# Patient Record
Sex: Female | Born: 1965 | Race: White | Hispanic: No | Smoking: Never smoker
Health system: Southern US, Community
[De-identification: ages and names within clinical notes are randomized; demographics above are authoritative.]

## PROBLEM LIST (undated history)

## (undated) DIAGNOSIS — F419 Anxiety disorder, unspecified: Secondary | ICD-10-CM

## (undated) DIAGNOSIS — S069X9A Unspecified intracranial injury with loss of consciousness of unspecified duration, initial encounter: Secondary | ICD-10-CM

## (undated) DIAGNOSIS — F32A Depression, unspecified: Secondary | ICD-10-CM

## (undated) DIAGNOSIS — M1990 Unspecified osteoarthritis, unspecified site: Secondary | ICD-10-CM

## (undated) DIAGNOSIS — F329 Major depressive disorder, single episode, unspecified: Secondary | ICD-10-CM

## (undated) HISTORY — DX: Anxiety disorder, unspecified: F41.9

## (undated) HISTORY — PX: TEMPOROMANDIBULAR JOINT SURGERY: SHX35

---

## 1998-01-18 ENCOUNTER — Emergency Department (HOSPITAL_COMMUNITY): Admission: EM | Admit: 1998-01-18 | Discharge: 1998-01-18 | Payer: Self-pay | Admitting: Emergency Medicine

## 1998-11-10 ENCOUNTER — Emergency Department (HOSPITAL_COMMUNITY): Admission: EM | Admit: 1998-11-10 | Discharge: 1998-11-10 | Payer: Self-pay | Admitting: Emergency Medicine

## 1998-11-15 ENCOUNTER — Emergency Department (HOSPITAL_COMMUNITY): Admission: EM | Admit: 1998-11-15 | Discharge: 1998-11-15 | Payer: Self-pay | Admitting: *Deleted

## 1998-11-15 ENCOUNTER — Encounter: Payer: Self-pay | Admitting: *Deleted

## 1999-06-19 HISTORY — PX: DILATION AND CURETTAGE OF UTERUS: SHX78

## 1999-08-07 ENCOUNTER — Encounter (INDEPENDENT_AMBULATORY_CARE_PROVIDER_SITE_OTHER): Payer: Self-pay | Admitting: Specialist

## 1999-08-07 ENCOUNTER — Observation Stay (HOSPITAL_COMMUNITY): Admission: AD | Admit: 1999-08-07 | Discharge: 1999-08-08 | Payer: Self-pay | Admitting: Obstetrics and Gynecology

## 1999-10-23 ENCOUNTER — Inpatient Hospital Stay (HOSPITAL_COMMUNITY): Admission: EM | Admit: 1999-10-23 | Discharge: 1999-10-23 | Payer: Self-pay | Admitting: Obstetrics & Gynecology

## 2000-03-26 ENCOUNTER — Inpatient Hospital Stay (HOSPITAL_COMMUNITY): Admission: EM | Admit: 2000-03-26 | Discharge: 2000-04-02 | Payer: Self-pay | Admitting: *Deleted

## 2000-04-12 ENCOUNTER — Emergency Department (HOSPITAL_COMMUNITY): Admission: EM | Admit: 2000-04-12 | Discharge: 2000-04-13 | Payer: Self-pay | Admitting: Emergency Medicine

## 2001-06-18 HISTORY — PX: TUBAL LIGATION: SHX77

## 2002-09-03 ENCOUNTER — Encounter: Payer: Self-pay | Admitting: Emergency Medicine

## 2002-09-03 ENCOUNTER — Emergency Department (HOSPITAL_COMMUNITY): Admission: EM | Admit: 2002-09-03 | Discharge: 2002-09-03 | Payer: Self-pay | Admitting: Emergency Medicine

## 2002-09-09 ENCOUNTER — Encounter: Admission: RE | Admit: 2002-09-09 | Discharge: 2002-09-09 | Payer: Self-pay | Admitting: Family Medicine

## 2002-09-09 ENCOUNTER — Encounter: Payer: Self-pay | Admitting: Family Medicine

## 2002-12-08 ENCOUNTER — Emergency Department (HOSPITAL_COMMUNITY): Admission: EM | Admit: 2002-12-08 | Discharge: 2002-12-08 | Payer: Self-pay | Admitting: Emergency Medicine

## 2005-01-24 ENCOUNTER — Emergency Department (HOSPITAL_COMMUNITY): Admission: EM | Admit: 2005-01-24 | Discharge: 2005-01-24 | Payer: Self-pay | Admitting: Emergency Medicine

## 2005-02-13 ENCOUNTER — Encounter: Admission: RE | Admit: 2005-02-13 | Discharge: 2005-02-13 | Payer: Self-pay | Admitting: Orthopaedic Surgery

## 2005-03-07 ENCOUNTER — Encounter: Admission: RE | Admit: 2005-03-07 | Discharge: 2005-03-07 | Payer: Self-pay | Admitting: Orthopaedic Surgery

## 2005-11-10 DIAGNOSIS — S069X9A Unspecified intracranial injury with loss of consciousness of unspecified duration, initial encounter: Secondary | ICD-10-CM

## 2005-11-10 DIAGNOSIS — S069XAA Unspecified intracranial injury with loss of consciousness status unknown, initial encounter: Secondary | ICD-10-CM

## 2005-11-10 HISTORY — DX: Unspecified intracranial injury with loss of consciousness of unspecified duration, initial encounter: S06.9X9A

## 2005-11-10 HISTORY — DX: Unspecified intracranial injury with loss of consciousness status unknown, initial encounter: S06.9XAA

## 2008-03-01 ENCOUNTER — Emergency Department (HOSPITAL_COMMUNITY): Admission: EM | Admit: 2008-03-01 | Discharge: 2008-03-02 | Payer: Self-pay | Admitting: Emergency Medicine

## 2008-04-15 ENCOUNTER — Emergency Department (HOSPITAL_COMMUNITY): Admission: EM | Admit: 2008-04-15 | Discharge: 2008-04-15 | Payer: Self-pay | Admitting: Emergency Medicine

## 2008-09-01 ENCOUNTER — Inpatient Hospital Stay (HOSPITAL_COMMUNITY): Admission: RE | Admit: 2008-09-01 | Discharge: 2008-09-06 | Payer: Self-pay | Admitting: *Deleted

## 2008-09-01 ENCOUNTER — Ambulatory Visit: Payer: Self-pay | Admitting: *Deleted

## 2008-09-12 ENCOUNTER — Emergency Department (HOSPITAL_COMMUNITY): Admission: EM | Admit: 2008-09-12 | Discharge: 2008-09-12 | Payer: Self-pay | Admitting: Emergency Medicine

## 2010-07-09 ENCOUNTER — Encounter: Payer: Self-pay | Admitting: Orthopaedic Surgery

## 2010-09-28 LAB — COMPREHENSIVE METABOLIC PANEL
AST: 17 U/L (ref 0–37)
BUN: 10 mg/dL (ref 6–23)
CO2: 29 mEq/L (ref 19–32)
Calcium: 9.3 mg/dL (ref 8.4–10.5)
Chloride: 105 mEq/L (ref 96–112)
Creatinine, Ser: 0.75 mg/dL (ref 0.4–1.2)
GFR calc Af Amer: >60 mL/min (ref >60)
GFR calc non Af Amer: >60 mL/min (ref >60)
Total Bilirubin: 0.6 mg/dL (ref 0.3–1.2)

## 2010-09-28 LAB — POCT I-STAT, CHEM 8
BUN: 10 mg/dL (ref 6–23)
Calcium, Ion: 1.14 mmol/L (ref 1.12–1.32)
Chloride: 107 mEq/L (ref 96–112)
Creatinine, Ser: 0.7 mg/dL (ref 0.4–1.2)
TCO2: 23 mmol/L (ref 0–100)

## 2010-09-28 LAB — URINALYSIS, ROUTINE W REFLEX MICROSCOPIC
Bilirubin Urine: NEGATIVE
Glucose, UA: NEGATIVE mg/dL
Ketones, ur: NEGATIVE mg/dL
Ketones, ur: NEGATIVE mg/dL
Leukocytes, UA: NEGATIVE
Protein, ur: NEGATIVE mg/dL
Specific Gravity, Urine: 1.026 (ref 1.005–1.030)
Urobilinogen, UA: 0.2 mg/dL (ref 0.0–1.0)
pH: 5.5 (ref 5.0–8.0)

## 2010-09-28 LAB — DIFFERENTIAL
Basophils Absolute: 0 10*3/uL (ref 0.0–0.1)
Basophils Relative: 1 % (ref 0–1)
Eosinophils Absolute: 0.4 10*3/uL (ref 0.0–0.7)
Neutro Abs: 2.5 10*3/uL (ref 1.7–7.7)
Neutrophils Relative %: 49 % (ref 43–77)

## 2010-09-28 LAB — BASIC METABOLIC PANEL
BUN: 9 mg/dL (ref 6–23)
CO2: 25 mEq/L (ref 19–32)
Calcium: 9 mg/dL (ref 8.4–10.5)
Creatinine, Ser: 0.7 mg/dL (ref 0.4–1.2)
GFR calc Af Amer: >60 mL/min (ref >60)
Glucose, Bld: 99 mg/dL (ref 70–99)

## 2010-09-28 LAB — CBC
HCT: 40 % (ref 36.0–46.0)
MCHC: 34 g/dL (ref 30.0–36.0)
MCHC: 34.1 g/dL (ref 30.0–36.0)
MCV: 86.4 fL (ref 78.0–100.0)
Platelets: 178 10*3/uL (ref 150–400)
Platelets: 245 10*3/uL (ref 150–400)
RBC: 3.9 MIL/uL (ref 3.87–5.11)
RBC: 4.63 MIL/uL (ref 3.87–5.11)
RDW: 14 % (ref 11.5–15.5)
WBC: 9.1 10*3/uL (ref 4.0–10.5)

## 2010-09-28 LAB — RAPID URINE DRUG SCREEN, HOSP PERFORMED
Amphetamines: NOT DETECTED
Barbiturates: NOT DETECTED
Benzodiazepines: POSITIVE — AB
Cocaine: NOT DETECTED

## 2010-09-28 LAB — URINE MICROSCOPIC-ADD ON

## 2010-09-28 LAB — URINE DRUGS OF ABUSE SCREEN W ALC, ROUTINE (REF LAB)
Amphetamine Screen, Ur: NEGATIVE
Cocaine Metabolites: NEGATIVE
Creatinine,U: 345.9 mg/dL
Ethyl Alcohol: <10 mg/dL (ref <10)
Marijuana Metabolite: NEGATIVE
Opiate Screen, Urine: NEGATIVE

## 2010-09-28 LAB — TSH: TSH: 5.277 u[IU]/mL — ABNORMAL HIGH (ref 0.350–4.500)

## 2010-09-28 LAB — PREGNANCY, URINE: Preg Test, Ur: NEGATIVE

## 2010-10-31 NOTE — H&P (Signed)
Stefanie Galvan, Stefanie Galvan NO.:  [id number]   MEDICAL RECORD NO.:  [id number]          PATIENT TYPE:  IPS   LOCATION:  0303                          FACILITY:  [facility]   PHYSICIAN:  Jasmine Pang, M.D. DATE OF BIRTH:  07/06/1965   DATE OF ADMISSION:  09/01/2008  DATE OF DISCHARGE:                       PSYCHIATRIC ADMISSION ASSESSMENT   This is a 45 year old female voluntarily admitted on September 01, 2008.  The patient states that she has had a bad day, has spoken to her  doctor about her symptoms of depression and anxiety, wanting to get some  help.  She has been having suicidal thoughts that she states are  getting too much, unsafe about being discharged.  She was experiencing  mood swings, anxiety, problems with irritability and panic attacks.  She  has a history of vertigo, currently on medications, but does not feel  that they are effective.  She feels that any information in regards to  bipolar seems to have her picture right next to it.  She has not been  sleeping well or sleeps too much.  She has been having weight gain over  the past few months with racing thoughts.  Denies any alcohol or drug  use.   PAST PSYCHIATRIC HISTORY:  The patient was here years ago in the 1990s.  No current outpatient treatment.  She did have a history of therapy in  the past but did not find it effective.  She did not feel that she  connected with the therapist.  States she was diagnosed with bipolar at  some point in time.   SOCIAL HISTORY:  She is a 45 year old divorced female.  She lives in  [place], has 2 children at home, ages 80 and 43, and she is unemployed.   FAMILY HISTORY:  Grandmother with depression.  Mother with anxiety.   ALCOHOL AND DRUG HISTORY:  Denies any alcohol or drug use.   Primary care [place] is Dr. Nathanial Rancher, who is prescribing her  psychotropic medications.   MEDICAL PROBLEMS:  Denies any acute or chronic health issues.   MEDICATIONS:  1. She has been  on Seroquel 300 mg at bedtime.  2. Paxil 30 mg.  3. Ativan on a p.r.n. basis.   DRUG ALLERGIES:  No known allergies.   PHYSICAL EXAMINATION:  This is a middle-aged female, overweight, but in  no acute distress.   REVIEW OF SYSTEMS:  Significant for increased appetite with increased  weight gain, positive for insomnia, positive for dizziness, positive for  seizures.  No complaints of headaches or joint pain, chest pain or  shortness of breath.   Her temperature is 98.5, 79 heart rate, 20 respirations, blood pressure  is 120/87, 214 pounds, 5 feet 9 inches tall.  This is a middle-aged  female.  Again she is overweight, in no acute distress.   HEAD:  Atraumatic.  EOMs are intact.  Negative lymphadenopathy.  CHEST:  Clear, no wheezing, no rales.  BREAST EXAM:  Deferred.  HEART:  Regular rate and rhythm.  No murmurs or gallops.  ABDOMEN:  Soft, nondistended, nontender abdomen.  PELVIC AND GU EXAM:  Deferred.  EXTREMITIES:  Moves all extremities 5+ against resistance.  No clubbing,  no edema.  SKIN:  Shows no rashes or lacerations.  NEUROLOGICAL:  Findings are intact.  Cranial nerves II to XII are  intact.   Her TSH is 5.277.  CMP is within normal limits.  CBC within normal  limits.   MENTAL STATUS EXAMINATION:  The patient at this time is in her room.  She is sitting on her bed.  She is fully alert and cooperative, casually  dressed, with good eye contact.  Her speech is clear, normal pace and  tone.  The patient's mood is depressed and anxious.  The patient was  somewhat irritable, got angry when she thought she was going to be  leaving within a few days, but then became more receptive as  explanations were provided about length of stay and medications and  family session.  Thought process are coherent.  She promises safety  within our facility, but states she is unable to contract outside with  discharge.  Her memory is intact.  Her judgment and insight is fair.   Axis I:   Mood disorder, not otherwise specified, rule out bipolar  disorder, not otherwise specified.  Axis II:  Deferred.  Axis III:  No known medical conditions.  Axis IV:  Possible problems with occupation and other psychosocial  problems.  Axis V:  Current is 35.   Our plan is to contract safety.  Stabilize mood and thinking.  We will  resume her Seroquel and Paxil at this time.  The patient will be in the  blue group.  Will have a family session with her boyfriend and her  mother.  The patient may benefit from some individual therapy.  The  patient also may benefit from a mood stabilizer.  Her tentative length  of stay at this time is 3-4 days.      Landry Corporal, N.P.      Jasmine Pang, M.D.  Electronically Signed    JO/MEDQ  D:  09/03/2008  T:  09/03/2008  Job:  [id number]

## 2010-10-31 NOTE — Discharge Summary (Signed)
NAMECHINITA, SCHIMPF NO.:  [id number]   MEDICAL RECORD NO.:  [id number]          PATIENT TYPE:  IPS   LOCATION:  0303                          FACILITY:  [facility]   PHYSICIAN:  Jasmine Pang, M.D. DATE OF BIRTH:  Jul 03, 1965   DATE OF ADMISSION:  09/01/2008  DATE OF DISCHARGE:  09/06/2008                               DISCHARGE SUMMARY   IDENTIFICATION:  This is a 45 year old single divorced white female who  was admitted on a voluntary basis on September 01, 2008.   HISTORY OF PRESENT ILLNESS:  The patient states that she has had a bad  day.  She had spoken to her doctor about her symptoms of depression and  anxiety and wanted to get some help.  She had been having suicidal  thoughts that she states were getting too much.  She felt unsafe about  being discharged.  She has been experiencing mood swings, anxiety  problems with irritability, and panic attacks.  She has a history of  vertigo, currently on medications, but does not feel they are effective.  She feels that any information in regards to bipolar disorder seems to  describe her.  She has not been sleeping well or sleeps too much.  She  has been having weight gain over the past 3 months with racing thoughts.  She denies any alcohol or drug use.   PAST PSYCHIATRIC HISTORY:  The patient was here in the 1990s.  There is  no current outpatient treatment.  She did have a history of therapy in  the past, but did not find it effective.  She did not feel that she  connected with the therapist.  She states she was diagnosed with bipolar  disorder at that time.   FAMILY HISTORY:  Grandmother with depression.  Mother has anxiety.   ALCOHOL AND DRUG HISTORY:  Denies any alcohol or drug use.   MEDICAL PROBLEMS:  Denies any acute or chronic health issues.   MEDICATIONS:  She has been on Seroquel 300 mg at bedtime, Paxil 30 mg  daily, and Ativan on a p.r.n. basis.   DRUG ALLERGIES:  No known drug allergies.   PHYSICAL FINDINGS:  There were no acute physical or medical problems  noted.  She was fully assessed on our unit by a nurse practitioner.   ADMISSION LABORATORIES:  Her TSH was normal at 5.277, CMP was within  normal limits, and CBC was within normal limits.   HOSPITAL COURSE:  Upon admission, the patient was started on Ambien 10  mg p.o. at bedtime p.r.n. insomnia.  She was also started on Seroquel  300 mg p.o. at bedtime and Paxil 30 mg p.o. at bedtime, and lorazepam 1  mg p.o. t.i.d. p.r.n. anxiety.   In individual sessions with me, the patient had fair eye contact with  psychomotor retardation.  Speech was normal rate and flow.  Mood was  depressed, anxious, and somewhat irritable.  Affect was depressed and  tearful.  Her anxiety level was severe.  There was no evidence of  psychosis or thought disorder.  The patient states she wanted to be  a  good mother, but feels unstable my mind races.  She reports a history  of bipolar disorder and also anxiety.  On September 03, 2008, the patient  was lying in bed sleepy.  Mood was still depressed and anxious.  There  was positive suicidal ideation that is why I sleep.  Her family  visited last night and was very supportive.  I want to not feel  worthless anymore.  She discussed her biologic father I am done with  him.  She thinks she used to try to please him, but finally had to cut  off ties with him because he was a negative influence.  Paxil was  increased to 40 mg p.o. daily.  On September 04, 2008, the patient was  feeling much better.  Family visited, she felt supported by them and her  boyfriend.  She was sleeping well and appetite was good.  Seroquel was  increased to 400 mg p.o. at bedtime.  On September 05, 2008, mental status  was still improving.  She began the process of discharge planning.  A  family session was held with the patient, and her mother and her  boyfriend.  The family was educated in some extent about the patient's   medications and bipolar disorder.  The patient was felt supported by her  family and contracted for safety.  Her mood on September 06, 2008, was much  less depressed, less anxious, affect was consistent with mood.  There  was no suicidal or homicidal ideation.  No thoughts of self-injurious  behavior.  No auditory or visual hallucinations.  No paranoia or  delusions.  Thoughts were logical and goal directed.  Thought content,  no predominant theme, cognitive and was grossly intact.  Insight good.  Judgment good.  Impulse control good.  It was felt the patient was safe  for discharge and she wanted to go home today after the family session  with her mother.  Sleep was good and appetite was good.   DISCHARGE DIAGNOSES:  AXIS I:  Bipolar disorder not otherwise specified,  anxiety disorder not otherwise specified.  AXIS II:  None.  AXIS III:  None.  No known medical conditions.  AXIS IV:  Moderate (possible problems with occupation and other  psychosocial problems).  AXIS V:  Global assessment of functioning was 50 upon discharge, global  assessment of functioning was 35 upon admission, global assessment of  functioning highest past year was 65.   DISCHARGE PLAN:  There were no specific activity level or dietary  restrictions.   POST-HOSPITAL CARE PLAN:  The patient will go to [facility] in [place] on  September 08, 2008, at 9 o'clock.   DISCHARGE MEDICATIONS:  1. Seroquel 400 mg at bedtime.  2. Paxil 40 mg daily.  3. Ativan 1 mg up to t.i.d. p.r.n. anxiety.      Jasmine Pang, M.D.  Electronically Signed     BHS/MEDQ  D:  09/06/2008  T:  09/07/2008  Job:  [id number]

## 2010-11-03 NOTE — H&P (Signed)
[facility]  Patient:    Stefanie Galvan, Stefanie Galvan                      MRN: [id number] Adm. Date:  [id number] Attending:  Jasmine Pang                   Psychiatric Admission Assessment  DATE OF ADMISSION:  March 26, 2000  CHIEF COMPLAINT:  "Im not sure I can keep from hurting myself."  PATIENT IDENTIFICATION:  A 45 year old female from [place] who was admitted by me after being evaluated in the outpatient clinic.  HISTORY OF PRESENT ILLNESS:  Patient was seen by me in the [facility] outpatient clinic yesterday.  She complained of severe depressive and anxiety symptoms, including panic attacks (frequently). Neurovegetative symptoms include appetite changes (increased), insomnia (DFA and MA), anhedonia, anergia, difficult concentrating, feelings of hopelessness, worthlessness, guilt and low self esteem.  She has also been experiencing suicidal ideation and feels that she may not be able to control the urges to hurt herself.  She reports having repeated "images of me slitting my wrists."  She also reports episodes which appear to be somewhat hypomanic, in which she has driven behavior, pressured speech, flight of ideas, grandiosity, involvement in activities that sometimes show poor judgment, and extreme distractibility.  At times her mood is euphoric and expansive.  She states she has significant mood swings.  PAST PSYCHIATRIC HISTORY:  Patient has been treated for depression in the past.  She has been on Zoloft and also Prozac.  SUBSTANCE ABUSE HISTORY:  Patient denies.  She also does not smoke cigarettes.  FAMILY HISTORY: Patient denies.  PAST MEDICAL HISTORY:  Patient is currently healthy.  She has had TMJ surgery x in 1996.  She had a D&C after a miscarriage in February 2001.  DRUG ALLERGIES:  She is allergic to [facility].  SOCIAL HISTORY:  Patient has three children.  She has a supportive extended family including her mother,  grandmother and aunts.  She works as a Production designer, theatre/television/film at [employer], but states this job has been quite stressful due to conflict with her Production designer, theatre/television/film.  She has no legal problems,  MENTAL STATUS EXAMINATION:  Patient presented as a friendly but very tearful female dressed casually.  Her eye contact was good.  There was increased psychomotor activity.  Speech was fast and pressured.  She was hyper-verbal. Mood was depressed and anxious, affect tearful but she was able to reveal wide range at times.  There was positive suicidal ideation with a plan.  There was no homicidal ideation, no self injurious behavior, no aggression.  There was no psychosis or perceptual disturbance.  Thought processes revealed some flight of ideas.  Thought content:  Patient was anxious that she was going to have a panic attack.  On cognitive exam, patient was alert and oriented x 4. Attention and concentration were decreased.  Short term memory was impaired, long term memory intact by gross assessment.  Patient was very distractible. Insight poor, judgment poor.  General fund of knowledge were age and education level appropriate.  ADMISSION DIAGNOSES: Axis I:    Major depression, severe, versus bipolar disorder, severe, without            psychosis. Axis II:   Deferred. Axis III:  Healthy. Axis IV:   Severe. Axis V:    Global assessment of function of 10.  ASSETS AND STRENGTHS:  Patient is friendly and cooperative.  She  has extended family support.  She has a job and is motivated to take care of her children.  PROBLEM:  Mood instability with suicidal ideation.  SHORT TERM TREATMENT GOAL:  Resolution of suicidal ideation.  LONG TERM TREATMENT GOAL:  Resolution of mood instability.  INITIAL PLAN OF CARE:  We will begin Effexor XR 75 mg p.o. q.a.m. x several days and then increase to 150 mg p.o. q.a.m.  We will also begin Ativan 0.5 mg p.o. t.i.d. to address her severe anxiety.  In addition, she will be engaged in  psychotherapy to reduce cognitive distortions and decrease her suicidal ideation and decrease anxiety.  ESTIMATED LENGTH OF STAY:  Three to five days.  CONDITIONS NECESSARY FOR DISCHARGE:  No longer suicidal.  POST HOSPITAL CARE PLAN:  Patient will return home to live with her children. She will continue to have the support of her extended family.  FOLLOW UP:  Follow up treatment will be in my outpatient clinic at [facility] services.  Therapy will be arranged for her at this time. DD:  03/27/00 TD:  03/28/00 Job: [id number]

## 2010-11-03 NOTE — Discharge Summary (Signed)
[facility]  Patient:    Stefanie Galvan, Stefanie Galvan                      MRN: [id number] Adm. Date:  [id number] Disc. Date: [id number] Attending:  Miguel Aschoff Dictator:   Leilani Able, P.A.                           Discharge Summary  FINAL DIAGNOSIS:              Incomplete abortion.  PROCEDURE:                    Suction D&E.  SURGEON:                      Miguel Aschoff, M.D.  COMPLICATIONS:                None.  HOSPITAL COURSE:              This 45 year old G5 P3 was transferred from the [facility] emergency room to the [facility] for an incomplete AB.  Patient was taken to the operating room on February 19 by Dr. Miguel Aschoff, where a suction D&E was performed without complication.  Patients last period had been June 01, 1999.  She had been bleeding for about two days, but the bleeding and cramps had increased over the night.  Her blood type was B positive, so no need for RhoGAM. Patient was felt ready for discharge on postoperative day #2.  DIET:                         Regular.  MEDICATIONS:                  1. Darvocet-N 100 1 q.4h. as needed for pain.                               2. Told to take Chromagen Forte for her blood count.                               3. Doxycycline 100 mg 1 b.i.d.  ACTIVITY:                     Told to decrease activities.  FOLLOW-UP:                    Patient will follow up in the office in four weeks. DD:  08/28/99 TD:  08/28/99 Job: [id number]

## 2010-11-03 NOTE — Discharge Summary (Signed)
[facility]  Patient:    Stefanie Galvan, Stefanie Galvan                      MRN: [id number] Adm. Date:  [id number] Disc. Date: [id number] Attending:  Shelba Flake                           Discharge Summary  REASON FOR ADMISSION:  The patient is a 45 year old Caucasian female from [place] who had seen me on the day of admission in the outpatient clinic. She revealed she was suicidal, as well as having severe depressive and anxiety symptoms and panic attacks.  She could not contract for safety and felt she might harm herself.  For further admission information, please see the psychiatric admission assessment.  LABORATORY DATA:  A CBC with differential was grossly within normal limits except for a slightly decreased hematocrit of 35.7 (36-46).  A TSH and free T4 were within normal limits.  The urinalysis was within normal limits.  HOSPITAL COURSE:  Upon admission the patient was placed on her albuterol inhaler 2 puffs q.i.d. p.r.n. shortness of breath for asthma.  She was also started on Effexor XR 75 mg q.d. and Ativan 1 mg q.6h. p.r.n. anxiety.  On March 27, 2000 she was begun on a standing dose of Ativan 1 mg p.o. t.i.d. The p.r.n. Ativan was continued but total dose not to exceed 4 mg in a 24 hour period.  On March 28, 2000, the patients Effexor XR was increased to 150 mg p.o. q.a.m.  She was also started on Neurontin 200 mg p.o. t.i.d., which was increased to 300 mg p.o. t.i.d. on March 29, 2000.  On March 31, 2000, Neurontin was increased to 400 mg p.o. t.i.d.  On April 02, 2000, Neurontin was increased to 600 mg p.o. t.i.d.  The patient tolerated the medications well with no significant side effects, except for mild sedation.  She participated appropriately in unit therapeutic groups and activities.  Her psychotherapy revolved around decreasing her suicidal ideation and decreasing cognitive distortions.  The patient did have two periods where  she inflicted minor self harm by lightly scratching her wrists.  She was able to tell the staff each time this occurred and seemed to be wanting the attention that then followed these gestures.  DISCHARGE MENTAL STATUS EXAMINATION:  Mental status had improved.  Her eye contact was good.  There was psychomotor retardation, but it was resolving. Speech was normal, rate and flow.  Mood was depressed and anxious, but denied suicidal or homicidal ideation.  There was no evidence of self-injurious behavior or aggression.  There was no psychosis or perceptual disturbance Thought processes were logical and goal-directed.  On cognitive exam, judgment and insight had improved.  Attention and concentration had improved.  DISCHARGE DIAGNOSES: Axis I:    Bipolar disorder, most recent episode mixed. Axis II:   Features of a personality disorder, not otherwise specified. Axis III:  Healthy. Axis IV:   Severe. Axis V:    Global assessment of functioning upon discharge 55, global            assessment of functioning at admission was 10, highest in past year            was 70.  DISCHARGE MEDICATIONS:  Neurontin 600 mg p.o. t.i.d., Ativan 1 mg p.o. t.i.d., Effexor XR 150 mg p.o. q.a.m., albuterol inhaler as prescribed by her family physician.  ACTIVITY  LEVEL:  No restrictions.  DIET:  No restrictions.  SPECIAL INSTRUCTIONS:  The patient will be on medical leave until Monday April 08, 2000.  FOLLOW-UP:  The patient will see me in clinic on Thursday, March 18, 2000 at 4 p.m. for a medication checkup.  We will arrange therapy, possibly at the mental health center. DD:  04/23/00 TD:  04/24/00 Job: [id number]

## 2012-02-20 ENCOUNTER — Encounter (HOSPITAL_BASED_OUTPATIENT_CLINIC_OR_DEPARTMENT_OTHER): Payer: Self-pay | Admitting: *Deleted

## 2012-02-22 ENCOUNTER — Encounter (HOSPITAL_BASED_OUTPATIENT_CLINIC_OR_DEPARTMENT_OTHER): Payer: Self-pay | Admitting: *Deleted

## 2012-02-25 ENCOUNTER — Ambulatory Visit (HOSPITAL_BASED_OUTPATIENT_CLINIC_OR_DEPARTMENT_OTHER): Payer: BC Managed Care – PPO | Admitting: Certified Registered Nurse Anesthetist

## 2012-02-25 ENCOUNTER — Encounter (HOSPITAL_BASED_OUTPATIENT_CLINIC_OR_DEPARTMENT_OTHER): Payer: Self-pay

## 2012-02-25 ENCOUNTER — Encounter (HOSPITAL_BASED_OUTPATIENT_CLINIC_OR_DEPARTMENT_OTHER): Payer: Self-pay | Admitting: Anesthesiology

## 2012-02-25 ENCOUNTER — Ambulatory Visit (HOSPITAL_BASED_OUTPATIENT_CLINIC_OR_DEPARTMENT_OTHER)
Admission: RE | Admit: 2012-02-25 | Discharge: 2012-02-26 | Disposition: A | Discharge status: Home / Self Care | Payer: BC Managed Care – PPO | Source: Ambulatory Visit | Attending: Specialist | Admitting: Specialist

## 2012-02-25 ENCOUNTER — Encounter (HOSPITAL_BASED_OUTPATIENT_CLINIC_OR_DEPARTMENT_OTHER): Payer: Self-pay | Admitting: Certified Registered Nurse Anesthetist

## 2012-02-25 ENCOUNTER — Encounter (HOSPITAL_BASED_OUTPATIENT_CLINIC_OR_DEPARTMENT_OTHER)
Admission: RE | Disposition: A | Discharge status: Home / Self Care | Payer: Self-pay | Source: Ambulatory Visit | Attending: Specialist

## 2012-02-25 DIAGNOSIS — N62 Hypertrophy of breast: Secondary | ICD-10-CM | POA: Insufficient documentation

## 2012-02-25 HISTORY — DX: Depression, unspecified: F32.A

## 2012-02-25 HISTORY — DX: Major depressive disorder, single episode, unspecified: F32.9

## 2012-02-25 HISTORY — PX: BREAST REDUCTION SURGERY: SHX8

## 2012-02-25 SURGERY — MAMMOPLASTY, REDUCTION
Anesthesia: General | Site: Breast | Laterality: Bilateral | Wound class: Clean

## 2012-02-25 MED ORDER — EPHEDRINE SULFATE 50 MG/ML IJ SOLN
INTRAMUSCULAR | Status: DC | PRN
  Administered 2012-02-25 (×3): 10 mg via INTRAVENOUS

## 2012-02-25 MED ORDER — LIDOCAINE HCL (CARDIAC) 20 MG/ML IV SOLN
INTRAVENOUS | Status: DC | PRN
  Administered 2012-02-25: 50 mg via INTRAVENOUS

## 2012-02-25 MED ORDER — METOCLOPRAMIDE HCL 5 MG/ML IJ SOLN
10.0000 mg | Freq: Once | INTRAMUSCULAR | Status: AC | PRN
  Administered 2012-02-25: 10 mg via INTRAVENOUS

## 2012-02-25 MED ORDER — ONDANSETRON HCL 4 MG/2ML IJ SOLN: 4.0000 mg | Freq: Four times a day (QID) | INTRAMUSCULAR | Status: DC | PRN

## 2012-02-25 MED ORDER — FENTANYL CITRATE 0.05 MG/ML IJ SOLN
INTRAMUSCULAR | Status: DC | PRN
  Administered 2012-02-25 (×2): 50 ug via INTRAVENOUS
  Administered 2012-02-25: 100 ug via INTRAVENOUS

## 2012-02-25 MED ORDER — LIDOCAINE HCL 4 % MT SOLN
OROMUCOSAL | Status: DC | PRN
  Administered 2012-02-25: 4 mL via TOPICAL

## 2012-02-25 MED ORDER — LACTATED RINGERS IV SOLN
INTRAVENOUS | Status: DC
  Administered 2012-02-25 (×3): via INTRAVENOUS

## 2012-02-25 MED ORDER — MIDAZOLAM HCL 5 MG/5ML IJ SOLN
INTRAMUSCULAR | Status: DC | PRN
  Administered 2012-02-25: 2 mg via INTRAVENOUS

## 2012-02-25 MED ORDER — ACETAMINOPHEN 10 MG/ML IV SOLN
1000.0000 mg | Freq: Once | INTRAVENOUS | Status: AC
  Administered 2012-02-25: 1000 mg via INTRAVENOUS

## 2012-02-25 MED ORDER — OXYCODONE HCL 5 MG PO TABS: 5.0000 mg | ORAL_TABLET | Freq: Once | ORAL | Status: AC | PRN

## 2012-02-25 MED ORDER — DEXTROSE IN LACTATED RINGERS 5 % IV SOLN
INTRAVENOUS | Status: DC
  Administered 2012-02-25: 125 mL/h via INTRAVENOUS

## 2012-02-25 MED ORDER — CEFAZOLIN SODIUM 1-5 GM-% IV SOLN
1.0000 g | Freq: Four times a day (QID) | INTRAVENOUS | Status: AC
  Administered 2012-02-25 – 2012-02-26 (×3): 1 g via INTRAVENOUS

## 2012-02-25 MED ORDER — MORPHINE SULFATE 2 MG/ML IJ SOLN
2.0000 mg | INTRAMUSCULAR | Status: DC | PRN
  Administered 2012-02-25 (×2): 2 mg via INTRAVENOUS

## 2012-02-25 MED ORDER — DEXAMETHASONE SODIUM PHOSPHATE 4 MG/ML IJ SOLN
INTRAMUSCULAR | Status: DC | PRN
  Administered 2012-02-25: 10 mg via INTRAVENOUS

## 2012-02-25 MED ORDER — PROPOFOL 10 MG/ML IV BOLUS
INTRAVENOUS | Status: DC | PRN
  Administered 2012-02-25: 260 mg via INTRAVENOUS

## 2012-02-25 MED ORDER — OXYCODONE HCL 5 MG/5ML PO SOLN: 5.0000 mg | Freq: Once | ORAL | Status: AC | PRN

## 2012-02-25 MED ORDER — CEFAZOLIN SODIUM-DEXTROSE 2-3 GM-% IV SOLR
2.0000 g | INTRAVENOUS | Status: AC
  Administered 2012-02-25: 2 g via INTRAVENOUS

## 2012-02-25 MED ORDER — DIPHENHYDRAMINE HCL 12.5 MG/5ML PO ELIX: 12.5000 mg | ORAL_SOLUTION | Freq: Four times a day (QID) | ORAL | Status: DC | PRN

## 2012-02-25 MED ORDER — HYDROCODONE-ACETAMINOPHEN 5-325 MG PO TABS
1.0000 | ORAL_TABLET | ORAL | Status: DC | PRN
  Administered 2012-02-25 – 2012-02-26 (×4): 2 via ORAL

## 2012-02-25 MED ORDER — HYDROMORPHONE HCL PF 1 MG/ML IJ SOLN
0.2500 mg | INTRAMUSCULAR | Status: DC | PRN
  Administered 2012-02-25 (×2): 0.5 mg via INTRAVENOUS

## 2012-02-25 MED ORDER — SUCCINYLCHOLINE CHLORIDE 20 MG/ML IJ SOLN
INTRAMUSCULAR | Status: DC | PRN
  Administered 2012-02-25: 100 mg via INTRAVENOUS

## 2012-02-25 MED ORDER — DIPHENHYDRAMINE HCL 50 MG/ML IJ SOLN: 12.5000 mg | Freq: Four times a day (QID) | INTRAMUSCULAR | Status: DC | PRN

## 2012-02-25 MED ORDER — SCOPOLAMINE 1 MG/3DAYS TD PT72
1.0000 | MEDICATED_PATCH | Freq: Once | TRANSDERMAL | Status: DC
  Administered 2012-02-25: 1.5 mg via TRANSDERMAL

## 2012-02-25 MED ORDER — SODIUM CHLORIDE 0.9 % IV SOLN: 25.0000 mg | Freq: Once | INTRAVENOUS | Status: DC

## 2012-02-25 MED ORDER — LIDOCAINE-EPINEPHRINE 0.5 %-1:200000 IJ SOLN
INTRAMUSCULAR | Status: DC | PRN
  Administered 2012-02-25: 100 mL

## 2012-02-25 MED ORDER — ONDANSETRON HCL 4 MG/2ML IJ SOLN
INTRAMUSCULAR | Status: DC | PRN
  Administered 2012-02-25: 4 mg via INTRAVENOUS

## 2012-02-25 SURGICAL SUPPLY — 58 items
APL SKNCLS STERI-STRIP NONHPOA (GAUZE/BANDAGES/DRESSINGS) ×2
BAG DECANTER FOR FLEXI CONT (MISCELLANEOUS) ×2 IMPLANT
BENZOIN TINCTURE PRP APPL 2/3 (GAUZE/BANDAGES/DRESSINGS) ×4 IMPLANT
BLADE KNIFE  20 PERSONNA (BLADE) ×2
BLADE KNIFE 20 PERSONNA (BLADE) ×2 IMPLANT
BLADE KNIFE PERSONA 15 (BLADE) ×2 IMPLANT
CANISTER SUCTION 1200CC (MISCELLANEOUS) ×2 IMPLANT
CLOTH BEACON ORANGE TIMEOUT ST (SAFETY) ×2 IMPLANT
COVER MAYO STAND STRL (DRAPES) ×2 IMPLANT
COVER TABLE BACK 60X90 (DRAPES) ×2 IMPLANT
DECANTER SPIKE VIAL GLASS SM (MISCELLANEOUS) ×4 IMPLANT
DRAIN CHANNEL 10F 3/8 F FF (DRAIN) ×4 IMPLANT
DRAPE LAPAROSCOPIC ABDOMINAL (DRAPES) ×2 IMPLANT
DRAPE UTILITY XL STRL (DRAPES) ×2 IMPLANT
DRSG PAD ABDOMINAL 8X10 ST (GAUZE/BANDAGES/DRESSINGS) ×8 IMPLANT
ELECT REM PT RETURN 9FT ADLT (ELECTROSURGICAL) ×2
ELECTRODE REM PT RTRN 9FT ADLT (ELECTROSURGICAL) ×1 IMPLANT
EVACUATOR SILICONE 100CC (DRAIN) ×4 IMPLANT
FILTER 7/8 IN (FILTER) IMPLANT
GAUZE XEROFORM 5X9 LF (GAUZE/BANDAGES/DRESSINGS) ×4 IMPLANT
GLOVE BIO SURGEON STRL SZ 6.5 (GLOVE) ×2 IMPLANT
GLOVE BIOGEL M STRL SZ7.5 (GLOVE) ×2 IMPLANT
GLOVE BIOGEL PI IND STRL 8 (GLOVE) ×1 IMPLANT
GLOVE BIOGEL PI INDICATOR 8 (GLOVE) ×1
GLOVE ECLIPSE 7.0 STRL STRAW (GLOVE) ×2 IMPLANT
GOWN PREVENTION PLUS XXLARGE (GOWN DISPOSABLE) ×4 IMPLANT
IV NS 500ML (IV SOLUTION) ×2
IV NS 500ML BAXH (IV SOLUTION) ×1 IMPLANT
NDL SPNL 18GX3.5 QUINCKE PK (NEEDLE) ×1 IMPLANT
NEEDLE SPNL 18GX3.5 QUINCKE PK (NEEDLE) ×2 IMPLANT
NS IRRIG 1000ML POUR BTL (IV SOLUTION) ×2 IMPLANT
PACK BASIN DAY SURGERY FS (CUSTOM PROCEDURE TRAY) ×2 IMPLANT
PEN SKIN MARKING BROAD TIP (MISCELLANEOUS) ×3 IMPLANT
PILLOW FOAM RUBBER ADULT (PILLOWS) ×1 IMPLANT
PIN SAFETY STERILE (MISCELLANEOUS) ×2 IMPLANT
SHEETING SILICONE GEL EPI DERM (MISCELLANEOUS) IMPLANT
SLEEVE SCD COMPRESS KNEE MED (MISCELLANEOUS) ×1 IMPLANT
SPECIMEN JAR MEDIUM (MISCELLANEOUS) ×2 IMPLANT
SPECIMEN JAR X LARGE (MISCELLANEOUS) ×2 IMPLANT
SPONGE GAUZE 4X4 12PLY (GAUZE/BANDAGES/DRESSINGS) ×4 IMPLANT
SPONGE LAP 18X18 X RAY DECT (DISPOSABLE) ×8 IMPLANT
STRIP SUTURE WOUND CLOSURE 1/2 (SUTURE) ×10 IMPLANT
SUT MNCRL AB 3-0 PS2 18 (SUTURE) ×12 IMPLANT
SUT MON AB 2-0 CT1 36 (SUTURE) IMPLANT
SUT MON AB 5-0 PS2 18 (SUTURE) ×4 IMPLANT
SUT PROLENE 3 0 PS 2 (SUTURE) ×12 IMPLANT
SYR 50ML LL SCALE MARK (SYRINGE) ×4 IMPLANT
SYR CONTROL 10ML LL (SYRINGE) IMPLANT
TAPE HYPAFIX 6X30 (GAUZE/BANDAGES/DRESSINGS) ×2 IMPLANT
TAPE MEASURE 72IN RETRACT (INSTRUMENTS)
TAPE MEASURE LINEN 72IN RETRCT (INSTRUMENTS) IMPLANT
TAPE PAPER MEDFIX 1IN X 10YD (GAUZE/BANDAGES/DRESSINGS) ×2 IMPLANT
TOWEL OR NON WOVEN STRL DISP B (DISPOSABLE) ×2 IMPLANT
TUBE CONNECTING 20X1/4 (TUBING) ×2 IMPLANT
UNDERPAD 30X30 INCONTINENT (UNDERPADS AND DIAPERS) ×5 IMPLANT
VAC PENCILS W/TUBING CLEAR (MISCELLANEOUS) ×2 IMPLANT
WATER STERILE IRR 1000ML POUR (IV SOLUTION) ×2 IMPLANT
YANKAUER SUCT BULB TIP NO VENT (SUCTIONS) ×2 IMPLANT

## 2012-02-25 NOTE — H&P (Signed)
Stefanie Galvan is an 46 y.o. female.   Chief Complaint: Severe macromastia HPI: Increased back and shoulder pain and intertrigo  Past Medical History  Diagnosis Date  . Depression 2.5 yrs ago    situational dep-no current tx/meds    Past Surgical History  Procedure Date  . Temporomandibular joint surgery 1984; 1996    X 2; bilateral  . Tubal ligation 2003  . Dilation and curettage of uterus 2001    History reviewed. No pertinent family history. Social History:  reports that she has never smoked. She has never used smokeless tobacco. She reports that she drinks alcohol. She reports that she does not use illicit drugs.  Allergies:  Allergies  Allergen Reactions  . Anaprox (Naproxen Sodium) Shortness Of Breath    Heart palpitations, shortness of breath, generalized swelling    No prescriptions prior to admission    No results found for this or any previous visit (from the past 48 hour(s)). No results found.  Review of Systems  Constitutional: Negative.   HENT: Positive for neck pain.   Eyes: Negative.   Respiratory: Negative.   Cardiovascular: Negative.   Gastrointestinal: Negative.   Genitourinary: Negative.   Musculoskeletal: Positive for back pain.  Skin: Positive for rash.  Neurological: Negative.   Endo/Heme/Allergies: Negative.   Psychiatric/Behavioral: Negative.     Blood pressure 120/77, pulse 54, temperature 98.6 F (37 C), temperature source Oral, resp. rate 20, height 5' 7.75" (1.721 m), weight 83.371 kg (183 lb 12.8 oz), last menstrual period 02/08/2012, SpO2 97.00%. Physical Exam   Assessment/Plan Severe macromastia/Bilateral breast reductions  Stefanie Galvan L 02/25/2012, 8:54 AM

## 2012-02-25 NOTE — Brief Op Note (Signed)
02/25/2012  11:34 AM  PATIENT:  Stefanie Galvan  46 y.o. female  PRE-OPERATIVE DIAGNOSIS:  Macromastia, bilateral  POST-OPERATIVE DIAGNOSIS:  Macromastia, bilateral  PROCEDURE:  Procedure(s) (LRB) with comments: MAMMARY REDUCTION  (BREAST) (Bilateral) - bilateral reduction  SURGEON:  Surgeon(s) and Role:    * Louisa Second, MD - Primary  PHYSICIAN ASSISTANT:   ASSISTANTS: none   ANESTHESIA:   general  EBL:  Total I/O In: 2300 [I.V.:2300] Out: -   BLOOD ADMINISTERED:none  DRAINS: (right and left chest areas) Jackson-Pratt drain(s) with closed bulb suction in the right and left chest areas   LOCAL MEDICATIONS USED:  LIDOCAINE   SPECIMEN:  Excision  DISPOSITION OF SPECIMEN:  PATHOLOGY  COUNTS:  YES  TOURNIQUET:  * No tourniquets in log *  DICTATION: .Other Dictation: Dictation Number [id number]  PLAN OF CARE: Admit for overnight observation  PATIENT DISPOSITION:  PACU - hemodynamically stable.   Delay start of Pharmacological VTE agent (>24hrs) due to surgical blood loss or risk of bleeding: yes

## 2012-02-25 NOTE — Anesthesia Procedure Notes (Signed)
Procedure Name: Intubation Date/Time: 02/25/2012 9:32 AM Performed by: Burna Cash Pre-anesthesia Checklist: Patient identified, Emergency Drugs available, Suction available and Patient being monitored Patient Re-evaluated:Patient Re-evaluated prior to inductionOxygen Delivery Method: Circle System Utilized Preoxygenation: Pre-oxygenation with 100% oxygen Intubation Type: IV induction Ventilation: Mask ventilation without difficulty Laryngoscope Size: Mac and 3 Grade View: Grade I Tube type: Oral Number of attempts: 1 Airway Equipment and Method: stylet and oral airway Placement Confirmation: ETT inserted through vocal cords under direct vision,  positive ETCO2 and breath sounds checked- equal and bilateral Secured at: 21 cm Tube secured with: Tape Dental Injury: Teeth and Oropharynx as per pre-operative assessment

## 2012-02-25 NOTE — Anesthesia Postprocedure Evaluation (Signed)
Anesthesia Post Note  Patient: Stefanie Galvan  Procedure(s) Performed: Procedure(s) (LRB): MAMMARY REDUCTION  (BREAST) (Bilateral)  Anesthesia type: General  Patient location: PACU  Post pain: Pain level controlled  Post assessment: Patient's Cardiovascular Status Stable  Last Vitals:  Filed Vitals:   02/25/12 1300  BP: 128/76  Pulse: 98  Temp:   Resp: 18    Post vital signs: Reviewed and stable  Level of consciousness: alert  Complications: No apparent anesthesia complications

## 2012-02-25 NOTE — Transfer of Care (Signed)
Immediate Anesthesia Transfer of Care Note  Patient: Stefanie Galvan  Procedure(s) Performed: Procedure(s) (LRB) with comments: MAMMARY REDUCTION  (BREAST) (Bilateral) - bilateral reduction  Patient Location: PACU  Anesthesia Type: General  Level of Consciousness: awake, alert  and oriented  Airway & Oxygen Therapy: Patient Spontanous Breathing and Patient connected to face mask oxygen  Post-op Assessment: Report given to PACU RN and Post -op Vital signs reviewed and stable  Post vital signs: Reviewed and stable  Complications: No apparent anesthesia complications

## 2012-02-25 NOTE — Anesthesia Preprocedure Evaluation (Signed)
Anesthesia Evaluation  Patient identified by MRN, date of birth, ID band Patient awake    Reviewed: Allergy & Precautions, H&P , NPO status , Patient's Chart, lab work & pertinent test results, reviewed documented beta blocker date and time   Airway Mallampati: II TM Distance: >3 FB Neck ROM: full    Dental   Pulmonary neg pulmonary ROS,  breath sounds clear to auscultation        Cardiovascular negative cardio ROS  Rhythm:regular     Neuro/Psych PSYCHIATRIC DISORDERS negative neurological ROS     GI/Hepatic negative GI ROS, Neg liver ROS,   Endo/Other  negative endocrine ROS  Renal/GU negative Renal ROS  negative genitourinary   Musculoskeletal   Abdominal   Peds  Hematology negative hematology ROS (+)   Anesthesia Other Findings See surgeon's H&P   Reproductive/Obstetrics negative OB ROS                           Anesthesia Physical Anesthesia Plan  ASA: I  Anesthesia Plan: General   Post-op Pain Management:    Induction: Intravenous  Airway Management Planned: Oral ETT  Additional Equipment:   Intra-op Plan:   Post-operative Plan: Extubation in OR  Informed Consent: I have reviewed the patients History and Physical, chart, labs and discussed the procedure including the risks, benefits and alternatives for the proposed anesthesia with the patient or authorized representative who has indicated his/her understanding and acceptance.   Dental Advisory Given  Plan Discussed with: CRNA and Surgeon  Anesthesia Plan Comments:         Anesthesia Quick Evaluation

## 2012-02-26 ENCOUNTER — Encounter (HOSPITAL_BASED_OUTPATIENT_CLINIC_OR_DEPARTMENT_OTHER): Payer: Self-pay | Admitting: Specialist

## 2012-02-26 NOTE — Op Note (Signed)
NAMESINAHI, KNIGHTS             ACCOUNT NO.:  [id number]  MEDICAL RECORD NO.:  [id number]  LOCATION:                                 FACILITY:  PHYSICIAN:  Earvin Hansen L. Shon Hough, M.D.   DATE OF BIRTH:  DATE OF PROCEDURE:  02/25/2012 DATE OF DISCHARGE:                              OPERATIVE REPORT   A 45 year old lady with severe macromastia, back and shoulder pain secondary to large pendulous breasts, intertriginous changes in the past as well as refractory pain and responsive to cold and hot packs.  The patient is now being prepared for bilateral breast reductions using the inferior pedicle technique, excision of accessory breast tissue. Anesthesia is general.  Preoperatively, the patient was sat up and drawn for the reduction mammoplasty remarked the nipple-areolar complexes from over 34 cm to 23.  She underwent general anesthesia, intubated orally. Prep was done to the chest and breast areas in routine fashion using Hibiclens soap and solution, walled off with sterile towels and drapes so as to make a sterile field.  0.25% Xylocaine was injected locally for vasoconstriction,  200 mL total.  The wounds were scored with #15 blade and the skin of the inferior pedicle was de-epithelialized with #20 blade.  Medial and lateral fatty dermal pedicles were incised down to underlying fascia laterally, more accessory breast tissue was removed. The new keyhole was debulked and after proper hemostasis the flaps were transposed and stayed with 3-0 Prolene suture.  Subcutaneous closure was done with 3-0 Monocryl x2 layers and then a running subcuticular stitch of 5-0 Monocryl throughout the inverted T.  Same procedure was carried out on both sides with removal of over 700 g on the right side and 656 on the left.  On the left.  The wounds were cleansed.  They were drained with #10 fully fluted Blake drains which were placed in the depths of wound and brought out through the lateral-most portion  of the incision. The wounds were cut with sterile dressings including Steri-Strips 4x4's, ABDs, Hypafix tape.  She was then taken to recovery in excellent condition.  ESTIMATED BLOOD LOSS:  Less than 150 mL.  COMPLICATIONS:  None.     Yaakov Guthrie. Shon Hough, M.D.     Cathie Hoops  D:  02/25/2012  T:  02/26/2012  Job:  [id number]

## 2012-12-03 ENCOUNTER — Other Ambulatory Visit (HOSPITAL_COMMUNITY): Payer: Self-pay | Admitting: Orthopedic Surgery

## 2012-12-03 DIAGNOSIS — M545 Low back pain, unspecified: Secondary | ICD-10-CM

## 2012-12-03 DIAGNOSIS — M5126 Other intervertebral disc displacement, lumbar region: Secondary | ICD-10-CM

## 2012-12-08 ENCOUNTER — Encounter (HOSPITAL_COMMUNITY)
Admission: RE | Admit: 2012-12-08 | Discharge: 2012-12-08 | Disposition: A | Discharge status: Home / Self Care | Payer: BC Managed Care – PPO | Source: Ambulatory Visit | Attending: Orthopedic Surgery | Admitting: Orthopedic Surgery

## 2012-12-08 DIAGNOSIS — M5126 Other intervertebral disc displacement, lumbar region: Secondary | ICD-10-CM | POA: Insufficient documentation

## 2012-12-08 DIAGNOSIS — M545 Low back pain, unspecified: Secondary | ICD-10-CM | POA: Insufficient documentation

## 2012-12-08 MED ORDER — TECHNETIUM TC 99M MEDRONATE IV KIT
25.0000 | PACK | Freq: Once | INTRAVENOUS | Status: AC | PRN
  Administered 2012-12-08: 25 via INTRAVENOUS

## 2013-03-04 ENCOUNTER — Emergency Department: Payer: Self-pay

## 2013-03-04 LAB — BASIC METABOLIC PANEL
BUN: 14 mg/dL (ref 7–18)
Chloride: 105 mmol/L (ref 98–107)
Co2: 26 mmol/L (ref 21–32)
EGFR (African American): >60
Glucose: 85 mg/dL (ref 65–99)
Osmolality: 274 (ref 275–301)

## 2013-03-04 LAB — CBC
MCH: 29.8 pg (ref 26.0–34.0)
Platelet: 238 10*3/uL (ref 150–440)
RBC: 4.6 10*6/uL (ref 3.80–5.20)
WBC: 6.8 10*3/uL (ref 3.6–11.0)

## 2013-03-04 LAB — TROPONIN I: Troponin-I: <0.02 ng/mL

## 2013-03-23 ENCOUNTER — Ambulatory Visit: Payer: Self-pay | Admitting: Otolaryngology

## 2014-01-04 ENCOUNTER — Emergency Department (INDEPENDENT_AMBULATORY_CARE_PROVIDER_SITE_OTHER)
Admission: EM | Admit: 2014-01-04 | Discharge: 2014-01-04 | Disposition: A | Discharge status: Home / Self Care | Payer: Self-pay | Source: Home / Self Care

## 2014-01-04 ENCOUNTER — Encounter (HOSPITAL_COMMUNITY): Payer: Self-pay | Admitting: Emergency Medicine

## 2014-01-04 DIAGNOSIS — H8390 Unspecified disease of inner ear, unspecified ear: Secondary | ICD-10-CM

## 2014-01-04 DIAGNOSIS — H9390 Unspecified disorder of ear, unspecified ear: Secondary | ICD-10-CM

## 2014-01-04 DIAGNOSIS — R42 Dizziness and giddiness: Secondary | ICD-10-CM

## 2014-01-04 HISTORY — DX: Unspecified osteoarthritis, unspecified site: M19.90

## 2014-01-04 MED ORDER — ONDANSETRON 4 MG PO TBDP
ORAL_TABLET | ORAL | Status: AC
  Filled 2014-01-04: qty 1

## 2014-01-04 MED ORDER — MECLIZINE HCL 25 MG PO TABS: 25.0000 mg | ORAL_TABLET | Freq: Three times a day (TID) | ORAL | Status: DC

## 2014-01-04 MED ORDER — ONDANSETRON HCL 4 MG PO TABS: 4.0000 mg | ORAL_TABLET | Freq: Four times a day (QID) | ORAL | Status: DC

## 2014-01-04 MED ORDER — ONDANSETRON 4 MG PO TBDP
4.0000 mg | ORAL_TABLET | Freq: Once | ORAL | Status: AC
  Administered 2014-01-04: 4 mg via ORAL

## 2014-01-04 NOTE — ED Provider Notes (Signed)
CSN: [id number]     Arrival date & time 01/04/14  [id number] History   First MD Initiated Contact with Patient 01/04/14 1022     Chief Complaint  Patient presents with  . Dizziness   (Consider location/radiation/quality/duration/timing/severity/associated sxs/prior Treatment) HPI Comments: 48 year old female who presents with a complaint of dizziness for one week it is been progressing for the past 3 days. She also complains of a sensation of her ears feeling clogged. Associated symptoms include nausea without vomiting. Today she is experiencing circumoral paresthesias. The symptoms are worse with sitting and ambulating and turning the head. It is improved with lying down remaining in a supine position and immobile. She denies headache, problems with vision, speech, hearing, focal paresthesias or weakness. She endorses a medical history of head trauma possibly 8 years ago in which she had severe vertigo and this reminds her of that he cannot. She denies the room spinning but merely complains of dizziness.   Past Medical History  Diagnosis Date  . Depression 2.5 yrs ago    situational dep-no current tx/meds  . Arthritis    Past Surgical History  Procedure Laterality Date  . Temporomandibular joint surgery  1984; 1996    X 2; bilateral  . Tubal ligation  2003  . Dilation and curettage of uterus  2001  . Breast reduction surgery  02/25/2012    Procedure: MAMMARY REDUCTION  (BREAST);  Surgeon: Louisa SecondGerald Truesdale, MD;  Location: [facility];  Service: Plastics;  Laterality: Bilateral;  bilateral reduction   History reviewed. No pertinent family history. History  Substance Use Topics  . Smoking status: Never Smoker   . Smokeless tobacco: Never Used  . Alcohol Use: Yes     Comment: very rare   OB History   Grav Para Term Preterm Abortions TAB SAB Ect Mult Living                 Review of Systems  Constitutional: Positive for activity change and appetite change. Negative for  fever.  HENT: Negative for congestion, ear discharge, ear pain, hearing loss, postnasal drip, sneezing, sore throat and tinnitus.   Eyes: Negative.   Respiratory: Negative.   Cardiovascular: Negative for chest pain and palpitations.  Gastrointestinal: Positive for nausea. Negative for vomiting, abdominal pain, diarrhea and abdominal distention.  Genitourinary: Negative.   Musculoskeletal: Negative for back pain, neck pain and neck stiffness.       Wrist and hand pain, chronic thought to be arthritis.  Skin: Negative.   Neurological: Positive for dizziness and numbness. Negative for tremors, seizures, syncope, facial asymmetry, speech difficulty, weakness and headaches.  Psychiatric/Behavioral: The patient is nervous/anxious.     Allergies  Anaprox  Home Medications   Prior to Admission medications   Medication Sig Start Date End Date Taking? Authorizing Provider  celecoxib (CELEBREX) 100 MG capsule Take 100 mg by mouth 2 (two) times daily.   Yes Historical Provider, MD  DULoxetine (CYMBALTA) 60 MG capsule Take 60 mg by mouth daily.   Yes Historical Provider, MD  meclizine (ANTIVERT) 25 MG tablet Take 1 tablet (25 mg total) by mouth 3 (three) times daily. Prn dizziness 01/04/14   Hayden Rasmussenavid Valor Quaintance, NP  ondansetron (ZOFRAN) 4 MG tablet Take 1 tablet (4 mg total) by mouth every 6 (six) hours. 01/04/14   Hayden Rasmussenavid Halaina Vanduzer, NP   BP 139/95  Pulse 74  Temp(Src) 98.2 F (36.8 C) (Oral)  Resp 16  Ht 5\' 8"  (1.727 m)  SpO2 97% Physical Exam  Nursing  note and vitals reviewed. Constitutional: She is oriented to person, place, and time. She appears well-developed and well-nourished. No distress.  HENT:  Head: Normocephalic and atraumatic.  Mouth/Throat: Oropharynx is clear and moist. No oropharyngeal exudate.  Bilat TM's clear, noe erythema. + for retraction  Eyes: Conjunctivae and EOM are normal. Pupils are equal, round, and reactive to light.  When placed supine and head turned l then r. Demonstrated 10  beat nystagmus without sx's. No nystagmus during sitting eye exam.  Neck: Normal range of motion. Neck supple.  Cardiovascular: Normal rate, regular rhythm, normal heart sounds and intact distal pulses.   Pulmonary/Chest: Effort normal and breath sounds normal. No respiratory distress. She has no wheezes. She has no rales.  Musculoskeletal: She exhibits no edema and no tenderness.  Lymphadenopathy:    She has no cervical adenopathy.  Neurological: She is alert and oriented to person, place, and time. She has normal strength. She displays no tremor. No cranial nerve deficit or sensory deficit. She exhibits normal muscle tone. She displays no seizure activity. Coordination normal.  Romberg positive Heel to toe, fall out of step first try, 2nd improved.   Skin: Skin is warm and dry. No rash noted. No erythema.    ED Course  Procedures (including critical care time) Labs Review Labs Reviewed - No data to display  Imaging Review No results found.   MDM   1. Disorder of inner ear, unspecified laterality   2. Dizziness    Neuro exam nl with exception of + romberg and heel to toe. If develop red flag s and s  Go to the ED, otherwise wee your PCP Aofran 4 mg po no and Rx to go antivert 25 mg tid prn Clear liquids, stay well ydrated, off work 2 d     Hayden Rasmussen, NP 01/04/14 1108

## 2014-01-04 NOTE — ED Provider Notes (Signed)
Medical screening examination/treatment/procedure(s) were performed by resident physician or non-physician practitioner and as supervising physician I was immediately available for consultation/collaboration.  Randal BubaErin Kaydra Borgen, MD     Charm RingsErin J Ladesha Pacini, MD 01/04/14 [phone]

## 2014-01-04 NOTE — ED Notes (Signed)
C/o prior history of bad inner ear problems and for past few days, has felt dizzy every time she moves or turns her head. Nausea, vomiting, w/d/color good at present

## 2014-01-04 NOTE — Discharge Instructions (Signed)
Dizziness °Dizziness is a common problem. It is a feeling of unsteadiness or light-headedness. You may feel like you are about to faint. Dizziness can lead to injury if you stumble or fall. A person of any age group can suffer from dizziness, but dizziness is more common in older adults. °CAUSES  °Dizziness can be caused by many different things, including: °· Middle ear problems. °· Standing for too long. °· Infections. °· An allergic reaction. °· Aging. °· An emotional response to something, such as the sight of blood. °· Side effects of medicines. °· Tiredness. °· Problems with circulation or blood pressure. °· Excessive use of alcohol or medicines, or illegal drug use. °· Breathing too fast (hyperventilation). °· An irregular heart rhythm (arrhythmia). °· A low red blood cell count (anemia). °· Pregnancy. °· Vomiting, diarrhea, fever, or other illnesses that cause body fluid loss (dehydration). °· Diseases or conditions such as Parkinson's disease, high blood pressure (hypertension), diabetes, and thyroid problems. °· Exposure to extreme heat. °DIAGNOSIS  °Your health care provider will ask about your symptoms, perform a physical exam, and perform an electrocardiogram (ECG) to record the electrical activity of your heart. Your health care provider may also perform other heart or blood tests to determine the cause of your dizziness. These may include: °· Transthoracic echocardiogram (TTE). During echocardiography, sound waves are used to evaluate how blood flows through your heart. °· Transesophageal echocardiogram (TEE). °· Cardiac monitoring. This allows your health care provider to monitor your heart rate and rhythm in real time. °· Holter monitor. This is a portable device that records your heartbeat and can help diagnose heart arrhythmias. It allows your health care provider to track your heart activity for several days if needed. °· Stress tests by exercise or by giving medicine that makes the heart beat  faster. °TREATMENT  °Treatment of dizziness depends on the cause of your symptoms and can vary greatly. °HOME CARE INSTRUCTIONS  °· Drink enough fluids to keep your urine clear or pale yellow. This is especially important in very hot weather. In older adults, it is also important in cold weather. °· Take your medicine exactly as directed if your dizziness is caused by medicines. When taking blood pressure medicines, it is especially important to get up slowly. °¨ Rise slowly from chairs and steady yourself until you feel okay. °¨ In the morning, first sit up on the side of the bed. When you feel okay, stand slowly while holding onto something until you know your balance is fine. °· Move your legs often if you need to stand in one place for a long time. Tighten and relax your muscles in your legs while standing. °· Have someone stay with you for 1-2 days if dizziness continues to be a problem. Do this until you feel you are well enough to stay alone. Have the person call your health care provider if he or she notices changes in you that are concerning. °· Do not drive or use heavy machinery if you feel dizzy. °· Do not drink alcohol. °SEEK IMMEDIATE MEDICAL CARE IF:  °· Your dizziness or light-headedness gets worse. °· You feel nauseous or vomit. °· You have problems talking, walking, or using your arms, hands, or legs. °· You feel weak. °· You are not thinking clearly or you have trouble forming sentences. It may take a friend or family member to notice this. °· You have chest pain, abdominal pain, shortness of breath, or sweating. °· Your vision changes. °· You notice   any bleeding.  You have side effects from medicine that seems to be getting worse rather than better. MAKE SURE YOU:   Understand these instructions.  Will watch your condition.  Will get help right away if you are not doing well or get worse. Document Released: 11/28/2000 Document Revised: 06/09/2013 Document Reviewed: 12/22/2010 [facility]  Patient Information 2015 [place], [place]. This information is not intended to replace advice given to you by your health care provider. Make sure you discuss any questions you have with your health care provider.  Benign Positional Vertigo You may have some components of this even though you do not have true vertigo. Vertigo means you feel like you or your surroundings are moving when they are not. Benign positional vertigo is the most common form of vertigo. Benign means that the cause of your condition is not serious. Benign positional vertigo is more common in older adults. CAUSES  Benign positional vertigo is the result of an upset in the labyrinth system. This is an area in the middle ear that helps control your balance. This may be caused by a viral infection, head injury, or repetitive motion. However, often no specific cause is found. SYMPTOMS  Symptoms of benign positional vertigo occur when you move your head or eyes in different directions. Some of the symptoms may include:  Loss of balance and falls.  Vomiting.  Blurred vision.  Dizziness.  Nausea.  Involuntary eye movements (nystagmus). DIAGNOSIS  Benign positional vertigo is usually diagnosed by physical exam. If the specific cause of your benign positional vertigo is unknown, your caregiver may perform imaging tests, such as magnetic resonance imaging (MRI) or computed tomography (CT). TREATMENT  Your caregiver may recommend movements or procedures to correct the benign positional vertigo. Medicines such as meclizine, benzodiazepines, and medicines for nausea may be used to treat your symptoms. In rare cases, if your symptoms are caused by certain conditions that affect the inner ear, you may need surgery. HOME CARE INSTRUCTIONS   Follow your caregiver's instructions.  Move slowly. Do not make sudden body or head movements.  Avoid driving.  Avoid operating heavy machinery.  Avoid performing any tasks that would be  dangerous to you or others during a vertigo episode.  Drink enough fluids to keep your urine clear or pale yellow. SEEK IMMEDIATE MEDICAL CARE IF:   You develop problems with walking, weakness, numbness, or using your arms, hands, or legs.  You have difficulty speaking.  You develop severe headaches.  Your nausea or vomiting continues or gets worse.  You develop visual changes.  Your family or friends notice any behavioral changes.  Your condition gets worse.  You have a fever.  You develop a stiff neck or sensitivity to light. MAKE SURE YOU:   Understand these instructions.  Will watch your condition.  Will get help right away if you are not doing well or get worse. Document Released: 03/12/2006 Document Revised: 08/27/2011 Document Reviewed: 02/22/2011 [facility]felsExitCare Patient Information 2015 [place], [place]. This information is not intended to replace advice given to you by your health care provider. Make sure you discuss any questions you have with your health care provider.

## 2014-10-08 NOTE — Op Note (Signed)
PATIENT NAME:  Stefanie Galvan, Stefanie Galvan MR#:  [id number] DATE OF BIRTH:  07/16/1965  DATE OF PROCEDURE:  03/23/2013  PREOPERATIVE DIAGNOSES: 1.  Nasal obstruction.  2.  Turbinate hypertrophy. 3.  Septal deviation.   POSTOPERATIVE DIAGNOSES: 1.  Nasal obstruction.  2.  Turbinate hypertrophy. 3.  Septal deviation.   PROCEDURES PERFORMED: 1.  Septoplasty.  2.  Bilateral inferior turbinate reduction via resection of tissue.   SURGEON: Bud Facereighton Laneshia Pina, M.Galvan.   ANESTHESIA: General endotracheal.  ESTIMATED BLOOD LOSS: 10 mL.   IV FLUIDS: Please see anesthesia record.   COMPLICATIONS: None.   DRAINS AND STENT PLACEMENTS: Bilateral Stammberger Sinu-Foam and septal splints.   COMPLICATIONS: None.   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   OPERATIVE FINDINGS: Bilateral septal spurs, right greater than left, with impaction onto the inferior turbinate, significant bilateral inferior turbinate hypertrophy, right greater than left.   DESCRIPTION OF PROCEDURE: After the patient was identified in holding and the benefits and risks of the procedure were discussed and consent was reviewed, the patient was taken to the operating room and placed in the supine position. General endotracheal anesthesia was induced and the patient was rotated 90 degrees. Under direct visualization, 8 mL of 1% lidocaine with 1:100,000 epinephrine was injected into the patient's anterior septum and anterior/inferior turbinates bilaterally. The patient was prepped and draped in a sterile fashion and a 15 blade scalpel was used to make a Killian-type incision of the left anterior septum. A mucoperichondrial flap was elevated with combination of a freer elevator and caudal dissector. This demonstrated an inferior based septal spur, bony in nature, on the left. An intercartilaginous incision  was made. Care was taken to ensure greater than 1 cm of an anterior septal strut. This was made with a freer elevator and a contralateral mucoperichondrial flap was elevated and then the area of obstructive cartilage and bone corresponding with the septal spur on the patient's right side was removed. This was taken down to the crest. Endoscopic visualization revealed reduction of the large septal spur with resolution of the area of impaction on the middle turbinate. At this time, a small drain hole was placed on the patient's right septal flap and meticulous hemostasis was ensured and attention was directed to patient's inferior turbinates. Using a freer elevator, the inferior turbinates were infractured and under endoscopic visualization a Kelly clamp was placed on the anterior-inferior third of each inferior turbinate. This was held in place for 1 minute for each side and then under endoscopic visualization using a Gruenwald through-cutting forceps the anterior/inferior third of each inferior turbinate was removed. Bovie suction cautery was used on the cut edge of the inferior turbinate and then the remaining inferior turbinate was outfractured using freer elevator under endoscopic visualization. Hemostasis was achieved with use of Bovie suction cautery as well as with Afrin-soaked pledgets. At this time, visualization of the patient's nasal airway revealed a widely patent nasal airway bilaterally and Stammberger Sinu-Foam was placed along the cut edge of each inferior turbinate and then the Killian incision was closed in an interrupted fashion with 3-0 chromic and then bilateral small septal splints were placed and these were held in place with a single 2-0 nylon suture. This was done after the patient's nasal cavity was copiously irrigated and meticulous hemostasis was ensured. At this time, the mouth gag was removed from the patient's oral cavity and care of the  patient was transferred to anesthesia.   ____________________________ Kyung Rudd, MD ccv:sb Galvan: 03/23/2013 10:00:02 ET T: 03/23/2013 10:30:42 ET JOB#: [id number]  cc: Kyung Rudd, MD, <Dictator> Kyung Rudd MD ELECTRONICALLY SIGNED 04/01/2013 6:44

## 2015-01-03 ENCOUNTER — Emergency Department (HOSPITAL_COMMUNITY): Payer: Self-pay

## 2015-01-03 ENCOUNTER — Emergency Department (HOSPITAL_COMMUNITY)
Admission: EM | Admit: 2015-01-03 | Discharge: 2015-01-03 | Disposition: A | Discharge status: Home / Self Care | Payer: Self-pay | Attending: Emergency Medicine | Admitting: Emergency Medicine

## 2015-01-03 ENCOUNTER — Encounter (HOSPITAL_COMMUNITY): Payer: Self-pay | Admitting: *Deleted

## 2015-01-03 DIAGNOSIS — R0789 Other chest pain: Secondary | ICD-10-CM | POA: Insufficient documentation

## 2015-01-03 DIAGNOSIS — Y998 Other external cause status: Secondary | ICD-10-CM | POA: Insufficient documentation

## 2015-01-03 DIAGNOSIS — Y9389 Activity, other specified: Secondary | ICD-10-CM | POA: Insufficient documentation

## 2015-01-03 DIAGNOSIS — X58XXXA Exposure to other specified factors, initial encounter: Secondary | ICD-10-CM | POA: Insufficient documentation

## 2015-01-03 DIAGNOSIS — Z7982 Long term (current) use of aspirin: Secondary | ICD-10-CM | POA: Insufficient documentation

## 2015-01-03 DIAGNOSIS — Y9289 Other specified places as the place of occurrence of the external cause: Secondary | ICD-10-CM | POA: Insufficient documentation

## 2015-01-03 DIAGNOSIS — R109 Unspecified abdominal pain: Secondary | ICD-10-CM | POA: Insufficient documentation

## 2015-01-03 DIAGNOSIS — M1990 Unspecified osteoarthritis, unspecified site: Secondary | ICD-10-CM | POA: Insufficient documentation

## 2015-01-03 DIAGNOSIS — F329 Major depressive disorder, single episode, unspecified: Secondary | ICD-10-CM | POA: Insufficient documentation

## 2015-01-03 DIAGNOSIS — R531 Weakness: Secondary | ICD-10-CM | POA: Insufficient documentation

## 2015-01-03 DIAGNOSIS — S50852A Superficial foreign body of left forearm, initial encounter: Secondary | ICD-10-CM | POA: Insufficient documentation

## 2015-01-03 DIAGNOSIS — G8929 Other chronic pain: Secondary | ICD-10-CM | POA: Insufficient documentation

## 2015-01-03 DIAGNOSIS — Z79899 Other long term (current) drug therapy: Secondary | ICD-10-CM | POA: Insufficient documentation

## 2015-01-03 DIAGNOSIS — M545 Low back pain: Secondary | ICD-10-CM | POA: Insufficient documentation

## 2015-01-03 DIAGNOSIS — Z87820 Personal history of traumatic brain injury: Secondary | ICD-10-CM | POA: Insufficient documentation

## 2015-01-03 DIAGNOSIS — R200 Anesthesia of skin: Secondary | ICD-10-CM | POA: Insufficient documentation

## 2015-01-03 DIAGNOSIS — G43109 Migraine with aura, not intractable, without status migrainosus: Secondary | ICD-10-CM | POA: Insufficient documentation

## 2015-01-03 LAB — CBC
HCT: 35.2 % — ABNORMAL LOW (ref 36.0–46.0)
Hemoglobin: 12.3 g/dL (ref 12.0–15.0)
MCH: 28.6 pg (ref 26.0–34.0)
MCHC: 34.9 g/dL (ref 30.0–36.0)
MCV: 81.9 fL (ref 78.0–100.0)
PLATELETS: 217 10*3/uL (ref 150–400)
RBC: 4.3 MIL/uL (ref 3.87–5.11)
RDW: 13.4 % (ref 11.5–15.5)
WBC: 4.9 10*3/uL (ref 4.0–10.5)

## 2015-01-03 LAB — BASIC METABOLIC PANEL
Anion gap: 10 (ref 5–15)
BUN: 11 mg/dL (ref 6–20)
CALCIUM: 9.2 mg/dL (ref 8.9–10.3)
CHLORIDE: 107 mmol/L (ref 101–111)
CO2: 24 mmol/L (ref 22–32)
Creatinine, Ser: 0.67 mg/dL (ref 0.44–1.00)
GFR calc non Af Amer: >60 mL/min (ref >60)
Glucose, Bld: 111 mg/dL — ABNORMAL HIGH (ref 65–99)
Potassium: 3.8 mmol/L (ref 3.5–5.1)
SODIUM: 141 mmol/L (ref 135–145)

## 2015-01-03 LAB — I-STAT TROPONIN, ED: TROPONIN I, POC: 0 ng/mL (ref 0.00–0.08)

## 2015-01-03 MED ORDER — ONDANSETRON 4 MG PO TBDP
4.0000 mg | ORAL_TABLET | Freq: Once | ORAL | Status: AC
  Administered 2015-01-03: 4 mg via ORAL

## 2015-01-03 MED ORDER — LIDOCAINE-EPINEPHRINE (PF) 2 %-1:200000 IJ SOLN
20.0000 mL | Freq: Once | INTRAMUSCULAR | Status: AC
  Administered 2015-01-03: 20 mL
  Filled 2015-01-03: qty 20

## 2015-01-03 MED ORDER — DIPHENHYDRAMINE HCL 50 MG/ML IJ SOLN
25.0000 mg | Freq: Once | INTRAMUSCULAR | Status: AC
  Administered 2015-01-03: 25 mg via INTRAVENOUS
  Filled 2015-01-03: qty 1

## 2015-01-03 MED ORDER — ONDANSETRON 4 MG PO TBDP
ORAL_TABLET | ORAL | Status: AC
  Filled 2015-01-03: qty 1

## 2015-01-03 MED ORDER — KETOROLAC TROMETHAMINE 30 MG/ML IJ SOLN
30.0000 mg | Freq: Once | INTRAMUSCULAR | Status: AC
  Administered 2015-01-03: 30 mg via INTRAVENOUS
  Filled 2015-01-03: qty 1

## 2015-01-03 MED ORDER — PROCHLORPERAZINE EDISYLATE 5 MG/ML IJ SOLN
10.0000 mg | Freq: Once | INTRAMUSCULAR | Status: AC
  Administered 2015-01-03: 10 mg via INTRAVENOUS
  Filled 2015-01-03: qty 2

## 2015-01-03 NOTE — ED Notes (Signed)
Pt reports left arm numbness and pain. Spoke with Dr. Effie ShyWentz regarding pt.

## 2015-01-03 NOTE — Discharge Instructions (Signed)
Read the information below.  You may return to the Emergency Department at any time for worsening condition or any new symptoms that concern you.     You are having a headache. No specific cause was found today for your headache. It may have been a migraine or other cause of headache. Stress, anxiety, fatigue, and depression are common triggers for headaches. Your headache today does not appear to be life-threatening or require hospitalization, but often the exact cause of headaches is not determined in the emergency department. Therefore, follow-up with your doctor is very important to find out what may have caused your headache, and whether or not you need any further diagnostic testing or treatment. Sometimes headaches can appear benign (not harmful), but then more serious symptoms can develop which should prompt an immediate re-evaluation by your doctor or the emergency department. SEEK MEDICAL ATTENTION IF: You develop possible problems with medications prescribed.  The medications don't resolve your headache, if it recurs , or if you have multiple episodes of vomiting or can't take fluids. You have a change from the usual headache. RETURN IMMEDIATELY IF you develop a sudden, severe headache or confusion, become poorly responsive or faint, develop a fever above 100.1F or problem breathing, have a change in speech, vision, swallowing, or understanding, or develop new weakness, numbness, tingling, incoordination, or have a seizure.   Migraine Headache A migraine headache is an intense, throbbing pain on one or both sides of your head. A migraine can last for 30 minutes to several hours. CAUSES  The exact cause of a migraine headache is not always known. However, a migraine may be caused when nerves in the brain become irritated and release chemicals that cause inflammation. This causes pain. Certain things may also trigger migraines, such  as:  Alcohol.  Smoking.  Stress.  Menstruation.  Aged cheeses.  Foods or drinks that contain nitrates, glutamate, aspartame, or tyramine.  Lack of sleep.  Chocolate.  Caffeine.  Hunger.  Physical exertion.  Fatigue.  Medicines used to treat chest pain (nitroglycerine), birth control pills, estrogen, and some blood pressure medicines. SIGNS AND SYMPTOMS  Pain on one or both sides of your head.  Pulsating or throbbing pain.  Severe pain that prevents daily activities.  Pain that is aggravated by any physical activity.  Nausea, vomiting, or both.  Dizziness.  Pain with exposure to bright lights, loud noises, or activity.  General sensitivity to bright lights, loud noises, or smells. Before you get a migraine, you may get warning signs that a migraine is coming (aura). An aura may include:  Seeing flashing lights.  Seeing bright spots, halos, or zigzag lines.  Having tunnel vision or blurred vision.  Having feelings of numbness or tingling.  Having trouble talking.  Having muscle weakness. DIAGNOSIS  A migraine headache is often diagnosed based on:  Symptoms.  Physical exam.  A CT scan or MRI of your head. These imaging tests cannot diagnose migraines, but they can help rule out other causes of headaches. TREATMENT Medicines may be given for pain and nausea. Medicines can also be given to help prevent recurrent migraines.  HOME CARE INSTRUCTIONS  Only take over-the-counter or prescription medicines for pain or discomfort as directed by your health care provider. The use of long-term narcotics is not recommended.  Lie down in a dark, quiet room when you have a migraine.  Keep a journal to find out what may trigger your migraine headaches. For example, write down:  What you eat and  drink.  How much sleep you get.  Any change to your diet or medicines.  Limit alcohol consumption.  Quit smoking if you smoke.  Get 7-9 hours of sleep, or as  recommended by your health care provider.  Limit stress.  Keep lights dim if bright lights bother you and make your migraines worse. SEEK IMMEDIATE MEDICAL CARE IF:   Your migraine becomes severe.  You have a fever.  You have a stiff neck.  You have vision loss.  You have muscular weakness or loss of muscle control.  You start losing your balance or have trouble walking.  You feel faint or pass out.  You have severe symptoms that are different from your first symptoms. MAKE SURE YOU:   Understand these instructions.  Will watch your condition.  Will get help right away if you are not doing well or get worse. Document Released: 06/04/2005 Document Revised: 10/19/2013 Document Reviewed: 02/09/2013 [facility] Patient Information 2015 [place]. This information is not intended to replace advice given to you by your health care provider. Make sure you discuss any questions you have with your health care provider.    Chest Pain (Nonspecific) It is often hard to give a specific diagnosis for the cause of chest pain. There is always a chance that your pain could be related to something serious, such as a heart attack or a blood clot in the lungs. You need to follow up with your health care provider for further evaluation. CAUSES   Heartburn.  Pneumonia or bronchitis.  Anxiety or stress.  Inflammation around your heart (pericarditis) or lung (pleuritis or pleurisy).  A blood clot in the lung.  A collapsed lung (pneumothorax). It can develop suddenly on its own (spontaneous pneumothorax) or from trauma to the chest.  Shingles infection (herpes zoster virus). The chest wall is composed of bones, muscles, and cartilage. Any of these can be the source of the pain.  The bones can be bruised by injury.  The muscles or cartilage can be strained by coughing or overwork.  The cartilage can be affected by inflammation and become sore (costochondritis). DIAGNOSIS  Lab  tests or other studies may be needed to find the cause of your pain. Your health care provider may have you take a test called an ambulatory electrocardiogram (ECG). An ECG records your heartbeat patterns over a 24-hour period. You may also have other tests, such as:  Transthoracic echocardiogram (TTE). During echocardiography, sound waves are used to evaluate how blood flows through your heart.  Transesophageal echocardiogram (TEE).  Cardiac monitoring. This allows your health care provider to monitor your heart rate and rhythm in real time.  Holter monitor. This is a portable device that records your heartbeat and can help diagnose heart arrhythmias. It allows your health care provider to track your heart activity for several days, if needed.  Stress tests by exercise or by giving medicine that makes the heart beat faster. TREATMENT   Treatment depends on what may be causing your chest pain. Treatment may include:  Acid blockers for heartburn.  Anti-inflammatory medicine.  Pain medicine for inflammatory conditions.  Antibiotics if an infection is present.  You may be advised to change lifestyle habits. This includes stopping smoking and avoiding alcohol, caffeine, and chocolate.  You may be advised to keep your head raised (elevated) when sleeping. This reduces the chance of acid going backward from your stomach into your esophagus. Most of the time, nonspecific chest pain will improve within 2-3 days with  rest and mild pain medicine.  HOME CARE INSTRUCTIONS   If antibiotics were prescribed, take them as directed. Finish them even if you start to feel better.  For the next few days, avoid physical activities that bring on chest pain. Continue physical activities as directed.  Do not use any tobacco products, including cigarettes, chewing tobacco, or electronic cigarettes.  Avoid drinking alcohol.  Only take medicine as directed by your health care provider.  Follow your  health care provider's suggestions for further testing if your chest pain does not go away.  Keep any follow-up appointments you made. If you do not go to an appointment, you could develop lasting (chronic) problems with pain. If there is any problem keeping an appointment, call to reschedule. SEEK MEDICAL CARE IF:   Your chest pain does not go away, even after treatment.  You have a rash with blisters on your chest.  You have a fever. SEEK IMMEDIATE MEDICAL CARE IF:   You have increased chest pain or pain that spreads to your arm, neck, jaw, back, or abdomen.  You have shortness of breath.  You have an increasing cough, or you cough up blood.  You have severe back or abdominal pain.  You feel nauseous or vomit.  You have severe weakness.  You faint.  You have chills. This is an emergency. Do not wait to see if the pain will go away. Get medical help at once. Call your local emergency services (911 in [place] Do not drive yourself to the hospital. MAKE SURE YOU:   Understand these instructions.  Will watch your condition.  Will get help right away if you are not doing well or get worse. Document Released: 03/14/2005 Document Revised: 06/09/2013 Document Reviewed: 01/08/2008 [facility] Patient Information 2015 [place], [place]. This information is not intended to replace advice given to you by your health care provider. Make sure you discuss any questions you have with your health care provider.

## 2015-01-03 NOTE — ED Provider Notes (Signed)
CSN: [id number]     Arrival date & time 01/03/15  1519 History   First MD Initiated Contact with Patient 01/03/15 1609     Chief Complaint  Patient presents with  . Chest Pain     (Consider location/radiation/quality/duration/timing/severity/associated sxs/prior Treatment) The history is provided by the patient.     Pt with hx TBI, migraines p/w headache x1 week with new weakness and numbness in the left upper and left lower extremity, tingling in the left face.  States the headache began gradually 1 week ago, starting in the occiput, now involving the mid section of her head almost to her hairline.  She has constant pain with intermittent stabbing sensation.  Has taken ibuprofen, goody powder without improvement.  This morning she developed left arm and leg weakness and numbness, and around 2pm tingling and sensation of drooping in the left face.  She is dizzy when she closes her eyes (room spinning).   Has chronic low back pain that is unchanged, chronic numbness in left 3rd-5th digit.  [facility]  Notes this headache feels different from her previous migraines. Denies any head trauma, neck stiffness, fevers.    On her way to the hospital she developed intermittent chest pain.  The chest pain is sharp and stabbing, located in one point of the left chest with occasional radiation to the left shoulder.  The pain lasts for a few seconds, has occurred 5-6 times and is random, no exacerbating or palliative symptoms.  She notes she has to "concentrate on breathing" when this occurs.    Over the past 2-3 days she has had a few episodes of N/V, abdominal cramping associated with bowel movements.   Denies diaphoresis fevers, chills, head injury, recent falls, abdominal pain, cough, worsening of chronic back pain. Has family hx of MI/heart problems - father and paternal grandfather.  Denies known autoimmune problems in the family.  No hx early strokes.    Past Medical  History  Diagnosis Date  . Depression 2.5 yrs ago    situational dep-no current tx/meds  . Arthritis    Past Surgical History  Procedure Laterality Date  . Temporomandibular joint surgery  1984; 1996    X 2; bilateral  . Tubal ligation  2003  . Dilation and curettage of uterus  2001  . Breast reduction surgery  02/25/2012    Procedure: MAMMARY REDUCTION  (BREAST);  Surgeon: Louisa SecondGerald Truesdale, MD;  Location: [facility];  Service: Plastics;  Laterality: Bilateral;  bilateral reduction   No family history on file. History  Substance Use Topics  . Smoking status: Never Smoker   . Smokeless tobacco: Never Used  . Alcohol Use: Yes     Comment: very rare   OB History    No data available     Review of Systems  All other systems reviewed and are negative.     Allergies  Anaprox  Home Medications   Prior to Admission medications   Medication Sig Start Date End Date Taking? Authorizing Provider  Aspirin-Acetaminophen (GOODY BODY PAIN) 500-325 MG PACK Take 1 packet by mouth every 8 (eight) hours as needed (pain).   Yes Historical Provider, MD  ibuprofen (ADVIL,MOTRIN) 200 MG tablet Take 200 mg by mouth every 6 (six) hours as needed for fever.   Yes Historical Provider, MD  traZODone (DESYREL) 100 MG tablet Take 100 mg by mouth at bedtime as needed for sleep.   Yes Historical Provider, MD  celecoxib (CELEBREX) 100 MG capsule Take 100 mg by mouth 2 (two) times daily.    Historical Provider, MD  meclizine (ANTIVERT) 25 MG tablet Take 1 tablet (25 mg total) by mouth 3 (three) times daily. Prn dizziness Patient not taking: Reported on 01/03/2015 01/04/14   Hayden Rasmussen, NP  ondansetron (ZOFRAN) 4 MG tablet Take 1 tablet (4 mg total) by mouth every 6 (six) hours. Patient not taking: Reported on 01/03/2015 01/04/14   Hayden Rasmussen, NP   BP 153/92 mmHg  Pulse 62  Temp(Src) 98.3 F (36.8 C) (Oral)  Resp 16  Wt 211 lb 11.2 oz (96.026 kg)  SpO2 96% Physical Exam  Constitutional:  She appears well-developed and well-nourished. No distress.  HENT:  Head: Normocephalic and atraumatic.  Neck: Normal range of motion. Neck supple.  No nuchal rigidity   Cardiovascular: Normal rate and regular rhythm.   Pulmonary/Chest: Effort normal and breath sounds normal. No respiratory distress. She has no wheezes. She has no rales.  Abdominal: Soft. She exhibits no distension. There is no tenderness. There is no rebound and no guarding.  Musculoskeletal: She exhibits no edema.  Neurological: She is alert. She exhibits normal muscle tone. GCS eye subscore is 4. GCS verbal subscore is 5. GCS motor subscore is 6.  CN II-XII intact with exception of decreased sensation on left face, EOMs intact, no pronator drift, grip strengths equal bilaterally; strength 5/5 in right extremities, 4/5 on left.  sensation intact in all extremities but decreased on left extremities.   Gait testing deferred   Skin: She is not diaphoretic.  Nursing note and vitals reviewed.   ED Course  FOREIGN BODY REMOVAL Date/Time: 01/03/2015 6:47 PM Performed by: Trixie Dredge Authorized by: Trixie Dredge Consent: Verbal consent obtained. Consent given by: patient Patient understanding: patient states understanding of the procedure being performed Patient identity confirmed: verbally with patient Body area: skin General location: upper extremity Location details: left forearm Anesthesia: local infiltration Local anesthetic: lidocaine 2% with epinephrine Anesthetic total: 2 ml Patient sedated: no Patient restrained: no Patient cooperative: yes Localization method: visualized Removal mechanism: hemostat and scalpel Dressing: antibiotic ointment and dressing applied Tendon involvement: none Depth: subcutaneous Complexity: simple 2 objects recovered. Objects recovered: metallic body piercings  Post-procedure assessment: foreign body removed Patient tolerance: Patient tolerated the procedure well with no immediate  complications   (including critical care time) Labs Review Labs Reviewed  BASIC METABOLIC PANEL - Abnormal; Notable for the following:    Glucose, Bld 111 (*)    All other components within normal limits  CBC - Abnormal; Notable for the following:    HCT 35.2 (*)    All other components within normal limits  I-STAT TROPOININ, ED    Imaging Review Dg Chest 2 View  01/03/2015   CLINICAL DATA:  C/O headache, Tingling on her left side, left side chest pain. States the headache has been on going for a week. States that the left side tingling began this AM at 1000. States that at 2 pm her face began feeling droopy. States that her left hand does not feel like it is working right. States that she had a TBI 9 years ago  EXAM: CHEST  2 VIEW  COMPARISON:  None.  FINDINGS: The heart size and mediastinal contours are within normal limits. Both lungs are clear. No pleural effusion or pneumothorax. The visualized skeletal structures are unremarkable.  IMPRESSION: No active cardiopulmonary disease.   Electronically Signed   By: Renard Hamper.D.  On: 01/03/2015 16:53   Ct Head Wo Contrast  01/03/2015   CLINICAL DATA:  Left facial tingling and possible weakness.  EXAM: CT HEAD WITHOUT CONTRAST  TECHNIQUE: Contiguous axial images were obtained from the base of the skull through the vertex without intravenous contrast.  COMPARISON:  None.  FINDINGS: Skull and Sinuses:Negative for fracture or destructive process. Symmetric skullbase foramina. The mastoids, middle ears, and imaged paranasal sinuses are clear.  Orbits: No acute abnormality.  Brain: No evidence of acute infarction, hemorrhage, hydrocephalus, or mass lesion/mass effect.  IMPRESSION: Negative head CT.   Electronically Signed   By: Marnee Spring M.D.   On: 01/03/2015 17:14   Mr Brain Wo Contrast  01/03/2015   CLINICAL DATA:  Headache for 1 week. Left-sided weakness and numbness/tingling beginning this morning. History of traumatic brain injury 9  years ago.  EXAM: MRI HEAD WITHOUT CONTRAST  TECHNIQUE: Multiplanar, multiecho pulse sequences of the brain and surrounding structures were obtained without intravenous contrast.  COMPARISON:  Head CT 01/03/2015  FINDINGS: There is no evidence of acute infarct, mass, midline shift, or extra-axial fluid collection. There is a small focus of cortical encephalomalacia in the left frontal operculum with a small amount of associated susceptibility artifact suggestive of chronic blood products. Brain parenchyma is normal in signal elsewhere. There is minimal cerebral atrophy.  Orbits are unremarkable. Paranasal sinuses and mastoid air cells are clear. Major intracranial vascular flow voids are preserved.  IMPRESSION: 1. No acute intracranial abnormality. 2. Small focus of encephalomalacia in the left frontal lobe which may be secondary to remote trauma given history.   Electronically Signed   By: Sebastian Ache   On: 01/03/2015 20:01     EKG Interpretation   Date/Time:  Monday January 03 2015 15:25:24 EDT Ventricular Rate:  67 PR Interval:  182 QRS Duration: 82 QT Interval:  378 QTC Calculation: 399 R Axis:   60 Text Interpretation:  Normal sinus rhythm Possible Left atrial enlargement  Borderline ECG no significant change since 2014 Confirmed by GOLDSTON  MD,  SCOTT ([id number]) on 01/03/2015 5:30:21 PM       8:26 PM Pt reports headache has resolved and left sided weakness has resolved with this.  Strength 5/5 in LUE, slightly decreased in LLE but pt states this is chronic due to chronic low back pain.   MDM   Final diagnoses:  Complicated migraine  Atypical chest pain   Afebrile, nontoxic patient with hx TBI and migraine with headache x 1 week, new neurologic deficits today.  Workup including MRI negative.  Neurologic deficits completely resolved when headache resolved with migraine cocktail.  Suspect complicated migraine.  Pt also with atypical chest pain.   PERC negative, HEART score zero.  This  developed on her way to the hospital.  Very atypical, more likely related to pain or anxiety.  D/C home with neurology follow up.  Discussed result, findings, treatment, and follow up  with patient.  Pt given return precautions.  Pt verbalizes understanding and agrees with plan.         Trixie Dredge, PA-C 01/04/15 1610  Pricilla Loveless, MD 01/05/15 [phone]

## 2015-01-03 NOTE — ED Notes (Signed)
Patient to ED with C/O headache, Tingling on her left side, left side chest pain. States the headache has been on going for a week.  States that the left side tingling began this AM at 1000. States that at 2 pm her face began feeling droopy. States that her left hand does not feel like it is working right.  States that she had a TBI 9 years ago.

## 2015-01-03 NOTE — ED Notes (Signed)
Pt states that seh began having central chest pain, headache and facial numbness starting yesterday.

## 2015-06-21 ENCOUNTER — Emergency Department (HOSPITAL_BASED_OUTPATIENT_CLINIC_OR_DEPARTMENT_OTHER)
Admission: EM | Admit: 2015-06-21 | Discharge: 2015-06-22 | Disposition: A | Discharge status: Home / Self Care | Payer: 59 | Attending: Emergency Medicine | Admitting: Emergency Medicine

## 2015-06-21 ENCOUNTER — Encounter (HOSPITAL_BASED_OUTPATIENT_CLINIC_OR_DEPARTMENT_OTHER): Payer: Self-pay | Admitting: Emergency Medicine

## 2015-06-21 DIAGNOSIS — M545 Low back pain: Secondary | ICD-10-CM | POA: Diagnosis present

## 2015-06-21 DIAGNOSIS — F329 Major depressive disorder, single episode, unspecified: Secondary | ICD-10-CM | POA: Insufficient documentation

## 2015-06-21 DIAGNOSIS — Z79899 Other long term (current) drug therapy: Secondary | ICD-10-CM | POA: Insufficient documentation

## 2015-06-21 DIAGNOSIS — G8929 Other chronic pain: Secondary | ICD-10-CM

## 2015-06-21 DIAGNOSIS — M549 Dorsalgia, unspecified: Secondary | ICD-10-CM | POA: Insufficient documentation

## 2015-06-21 DIAGNOSIS — M1990 Unspecified osteoarthritis, unspecified site: Secondary | ICD-10-CM | POA: Insufficient documentation

## 2015-06-21 MED ORDER — DIAZEPAM 5 MG/ML IJ SOLN
5.0000 mg | Freq: Once | INTRAMUSCULAR | Status: AC
  Administered 2015-06-21: 5 mg via INTRAMUSCULAR
  Filled 2015-06-21: qty 2

## 2015-06-21 MED ORDER — DEXAMETHASONE SODIUM PHOSPHATE 10 MG/ML IJ SOLN
10.0000 mg | Freq: Once | INTRAMUSCULAR | Status: AC
  Administered 2015-06-21: 10 mg via INTRAMUSCULAR
  Filled 2015-06-21: qty 1

## 2015-06-21 MED ORDER — DIAZEPAM 5 MG PO TABS: 5.0000 mg | ORAL_TABLET | Freq: Four times a day (QID) | ORAL | Status: DC | PRN

## 2015-06-21 NOTE — ED Notes (Signed)
Ice pack given to patient for back pain.

## 2015-06-21 NOTE — ED Provider Notes (Signed)
CSN: [id number]     Arrival date & time 06/21/15  1649 History   By signing my name below, I, Stefanie Galvan, attest that this documentation has been prepared under the direction and in the presence of Stefanie BarretteMarcy Ajani Schnieders, MD.  Electronically Signed: Arlan OrganAshley Galvan, ED Scribe. 06/21/2015. 11:00 PM.   Chief Complaint  Patient presents with  . Back Pain   HPI  HPI Comments: Stefanie Galvan is a 50 y.o. female with a PMHx of arthritiswho presents to the Emergency Department complaining of constant, ongoing, chronic in nature lower back pain; worsened in the last 24 hours. Pt states in the last several days her lower legs have "gone out on me". She also reports worsening pain with ambulation. OTC Tylenol attempted at home without any improvement. Pt has been evaluated by Dr. Larna Galvan for pain and Dr. Eugenio Galvan for route care for her back. She was last seen in November 2016 for follow up and was given steroid injections. 2 weeks later pt was seen and had a nerve block injection with some improvement. Ms. [name] attempted to contact both offices today but did not receive any calls back. She states both providers are currently out on vacation. No recent fever, chills, nausea, vomiting, or diarrhea. She denies any bowel or urinary incontinence.  PCP: No PCP Per Patient    Past Medical History  Diagnosis Date  . Depression 2.5 yrs ago    situational dep-no current tx/meds  . Arthritis    Past Surgical History  Procedure Laterality Date  . Temporomandibular joint surgery  1984; 1996    X 2; bilateral  . Tubal ligation  2003  . Dilation and curettage of uterus  2001  . Breast reduction surgery  02/25/2012    Procedure: MAMMARY REDUCTION  (BREAST);  Surgeon: Stefanie SecondGerald Truesdale, MD;  Location: [facility];  Service: Plastics;  Laterality: Bilateral;  bilateral reduction   History reviewed. No pertinent family history. Social History  Substance Use Topics  . Smoking status: Never Smoker   .  Smokeless tobacco: Never Used  . Alcohol Use: Yes     Comment: very rare   OB History    No data available     Review of Systems   A complete 10 system review of systems was obtained and all systems are negative except as noted in the HPI and PMH.    Allergies  Anaprox  Home Medications   Prior to Admission medications   Medication Sig Start Date End Date Taking? Authorizing Provider  Aspirin-Acetaminophen (GOODY BODY PAIN) 500-325 MG PACK Take 1 packet by mouth every 8 (eight) hours as needed (pain).    Historical Provider, MD  celecoxib (CELEBREX) 100 MG capsule Take 100 mg by mouth 2 (two) times daily.    Historical Provider, MD  diazepam (VALIUM) 5 MG tablet Take 1 tablet (5 mg total) by mouth every 6 (six) hours as needed for anxiety (spasms). 06/21/15   Stefanie BarretteMarcy Stefanie Calico, MD  ibuprofen (ADVIL,MOTRIN) 200 MG tablet Take 200 mg by mouth every 6 (six) hours as needed for fever.    Historical Provider, MD  meclizine (ANTIVERT) 25 MG tablet Take 1 tablet (25 mg total) by mouth 3 (three) times daily. Prn dizziness Patient not taking: Reported on 01/03/2015 01/04/14   Stefanie Rasmussenavid Mabe, NP  ondansetron (ZOFRAN) 4 MG tablet Take 1 tablet (4 mg total) by mouth every 6 (six) hours. Patient not taking: Reported on 01/03/2015 01/04/14   Stefanie Rasmussenavid Mabe, NP  traZODone (DESYREL) 100  MG tablet Take 100 mg by mouth at bedtime as needed for sleep.    Historical Provider, MD   Triage Vitals: BP 112/78 mmHg  Pulse 61  Resp 18  SpO2 100%   Physical Exam  Constitutional: She is oriented to person, place, and time. She appears well-developed and well-nourished.  HENT:  Head: Normocephalic and atraumatic.  Eyes: EOM are normal. Pupils are equal, round, and reactive to light.  Neck: Normal range of motion. Neck supple.  Cardiovascular: Normal rate, regular rhythm, normal heart sounds and intact distal pulses.   Pulmonary/Chest: Effort normal and breath sounds normal.  Abdominal: Soft. Bowel sounds are normal.  She exhibits no distension. There is no tenderness.  Musculoskeletal: Normal range of motion. She exhibits no edema.  Neurological: She is alert and oriented to person, place, and time. She has normal strength. Coordination normal. GCS eye subscore is 4. GCS verbal subscore is 5. GCS motor subscore is 6.  Patient is standing and ambulate with an antalgic gait. Lower extremity motor strength in flexion and extension is 5 out of 5.  Skin: Skin is warm, dry and intact.  Psychiatric: She has a normal mood and affect.  Nursing note and vitals reviewed.   ED Course  Procedures (including critical care time)  DIAGNOSTIC STUDIES: Oxygen Saturation is 99% on RA, Normal by my interpretation.    COORDINATION OF CARE: 10:53 PM- Will give Valium. Discussed treatment plan with pt at bedside and pt agreed to plan.     11:03 PM- Will discharge with prescriptions for Decadron and Valium.  Labs Review Labs Reviewed - No data to display  Imaging Review No results found. I have personally reviewed and evaluated these images and lab results as part of my medical decision-making.   EKG Interpretation None      MDM   Final diagnoses:  Chronic back pain   Patient has long history of chronic back pain. He states the pain has worsened over the past several days and she has been unable to see her orthopedic surgeon or her pain management physician on short notice. She reports she will be held have an appointment within the next several days. Patient stated that her legs are now sometimes "giving out". She reports however she is not sure status really due to weakness or just because the pain gets intense and then her legs give out. Objectively on physical examination the patient is intact for motor testing with bilateral flexion and extension against resistance of the lower extremities and capacity to stand and ambulate. She was counseled that if she had significant weakness that she felt was a change from  her baseline, she would need an MRI done tonight and the plan would be to transfer her to [facility] for MRI. Patient reported she did not want to undergo MRI tonight due to inconvenience factors for her son who had given her a ride. The patient was advised then to contact her physicians in the morning and return if she felt that the weakness had progressed or was any change from previously experienced symptoms. She was given Valium for muscle relaxation and a Decadron shot for inflammation. She reports she does not take narcotics of her own choosing. She reports she will be able to have a recheck with one of her providers within the next 2 days.  Stefanie Barrette, MD 06/22/15 1620

## 2015-06-21 NOTE — ED Notes (Signed)
Patient states she has been having back pain for years, but the past year she has back pain that has worsen.  She states she sees Dr. Larna DaughtersBazobo for pain and Dr. Eugenio Hoesumasky is who she sees for back pain, too.  She states she saw the PA in November and they gave her some shots in her back that didn't work.  She saw them again 2 weeks ago and had a nerve block injection that helped.  She called them 4 times today, but she never received a call back and she didn't know what else to do but come here to get relief.  She only takes Tylenol and the injections for pain on a regular basis.  The MD is going to do a nerve burn, but hasn't yet.

## 2015-06-21 NOTE — Discharge Instructions (Signed)

## 2015-06-21 NOTE — ED Notes (Signed)
Pt. In wheel chair waiting for EDP to see her.

## 2015-06-22 NOTE — ED Notes (Signed)
Patient stable and ambulatory.  Patient verbalizes understanding of discharge medications, instructions and follow-up. 

## 2016-02-08 ENCOUNTER — Emergency Department (HOSPITAL_COMMUNITY)
Admission: EM | Admit: 2016-02-08 | Discharge: 2016-02-08 | Disposition: A | Discharge status: Home / Self Care | Payer: 59 | Attending: Emergency Medicine | Admitting: Emergency Medicine

## 2016-02-08 ENCOUNTER — Encounter (HOSPITAL_COMMUNITY): Payer: Self-pay | Admitting: Neurology

## 2016-02-08 DIAGNOSIS — E860 Dehydration: Secondary | ICD-10-CM | POA: Diagnosis not present

## 2016-02-08 DIAGNOSIS — Z7982 Long term (current) use of aspirin: Secondary | ICD-10-CM | POA: Diagnosis not present

## 2016-02-08 DIAGNOSIS — R1110 Vomiting, unspecified: Secondary | ICD-10-CM | POA: Insufficient documentation

## 2016-02-08 DIAGNOSIS — R197 Diarrhea, unspecified: Secondary | ICD-10-CM | POA: Diagnosis not present

## 2016-02-08 LAB — COMPREHENSIVE METABOLIC PANEL
ALK PHOS: 57 U/L (ref 38–126)
ALT: 19 U/L (ref 14–54)
AST: 21 U/L (ref 15–41)
Albumin: 4.4 g/dL (ref 3.5–5.0)
Anion gap: 5 (ref 5–15)
BILIRUBIN TOTAL: 0.7 mg/dL (ref 0.3–1.2)
BUN: 8 mg/dL (ref 6–20)
CO2: 26 mmol/L (ref 22–32)
CREATININE: 0.63 mg/dL (ref 0.44–1.00)
Calcium: 9.6 mg/dL (ref 8.9–10.3)
Chloride: 109 mmol/L (ref 101–111)
GFR calc Af Amer: >60 mL/min (ref >60)
GFR calc non Af Amer: >60 mL/min (ref >60)
Glucose, Bld: 118 mg/dL — ABNORMAL HIGH (ref 65–99)
Potassium: 3.8 mmol/L (ref 3.5–5.1)
SODIUM: 140 mmol/L (ref 135–145)
Total Protein: 7 g/dL (ref 6.5–8.1)

## 2016-02-08 LAB — URINALYSIS, ROUTINE W REFLEX MICROSCOPIC
Glucose, UA: NEGATIVE mg/dL
HGB URINE DIPSTICK: NEGATIVE
Leukocytes, UA: NEGATIVE
Nitrite: NEGATIVE
Protein, ur: NEGATIVE mg/dL
Specific Gravity, Urine: 1.028 (ref 1.005–1.030)
pH: 5.5 (ref 5.0–8.0)

## 2016-02-08 LAB — CBC
HCT: 41.7 % (ref 36.0–46.0)
Hemoglobin: 14.5 g/dL (ref 12.0–15.0)
MCH: 29.1 pg (ref 26.0–34.0)
MCHC: 34.8 g/dL (ref 30.0–36.0)
MCV: 83.7 fL (ref 78.0–100.0)
Platelets: 253 10*3/uL (ref 150–400)
RBC: 4.98 MIL/uL (ref 3.87–5.11)
RDW: 12.8 % (ref 11.5–15.5)
WBC: 6.3 10*3/uL (ref 4.0–10.5)

## 2016-02-08 LAB — PREGNANCY, URINE: PREG TEST UR: NEGATIVE

## 2016-02-08 LAB — LIPASE, BLOOD: Lipase: 33 U/L (ref 11–51)

## 2016-02-08 MED ORDER — ONDANSETRON HCL 4 MG/2ML IJ SOLN
4.0000 mg | Freq: Once | INTRAMUSCULAR | Status: AC
  Administered 2016-02-08: 4 mg via INTRAVENOUS
  Filled 2016-02-08: qty 2

## 2016-02-08 MED ORDER — SODIUM CHLORIDE 0.9 % IV BOLUS (SEPSIS)
1000.0000 mL | Freq: Once | INTRAVENOUS | Status: AC
Start: 2016-02-08 — End: 2016-02-08
  Administered 2016-02-08: 1000 mL via INTRAVENOUS

## 2016-02-08 MED ORDER — ONDANSETRON 4 MG PO TBDP: ORAL_TABLET | ORAL | 0 refills | Status: DC

## 2016-02-08 MED ORDER — ONDANSETRON 4 MG PO TBDP
ORAL_TABLET | ORAL | Status: AC
  Filled 2016-02-08: qty 1

## 2016-02-08 MED ORDER — ONDANSETRON 4 MG PO TBDP
4.0000 mg | ORAL_TABLET | Freq: Once | ORAL | Status: AC | PRN
  Administered 2016-02-08: 4 mg via ORAL

## 2016-02-08 MED ORDER — SODIUM CHLORIDE 0.9 % IV BOLUS (SEPSIS)
1000.0000 mL | Freq: Once | INTRAVENOUS | Status: AC
  Administered 2016-02-08: 1000 mL via INTRAVENOUS

## 2016-02-08 NOTE — ED Provider Notes (Signed)
[facility]-EMERGENCY DEPT Provider Note   CSN: [id number] Arrival date & time: 02/08/16  1316     History   Chief Complaint Chief Complaint  Patient presents with  . Nausea  . Dizziness    HPI Stefanie Galvan is a 50 y.o. female hx of arthritis, depression, here with Nausea, vomiting, diarrhea. Patient states that she ate some spicy [place] food yesterday and then had several episodes of vomiting. She states that she was unable to keep anything down and stand. Has several episodes diarrhea and diffuse abdominal cramps. She felt very dehydrated. She states that she has history of gallstones but never had a gallbladder taken out. Denies any right upper quadrant pain or fevers. She felt light headed and dizzy    The history is provided by the patient.    Past Medical History:  Diagnosis Date  . Arthritis   . Depression 2.5 yrs ago   situational dep-no current tx/meds    There are no active problems to display for this patient.   Past Surgical History:  Procedure Laterality Date  . BREAST REDUCTION SURGERY  02/25/2012   Procedure: MAMMARY REDUCTION  (BREAST);  Surgeon: Louisa SecondGerald Truesdale, MD;  Location: [facility];  Service: Plastics;  Laterality: Bilateral;  bilateral reduction  . DILATION AND CURETTAGE OF UTERUS  2001  . TEMPOROMANDIBULAR JOINT SURGERY  1984; 1996   X 2; bilateral  . TUBAL LIGATION  2003    OB History    No data available       Home Medications    Prior to Admission medications   Medication Sig Start Date End Date Taking? Authorizing Provider  Aspirin-Acetaminophen (GOODY BODY PAIN) 500-325 MG PACK Take 1 packet by mouth every 8 (eight) hours as needed (pain).    Historical Provider, MD  celecoxib (CELEBREX) 100 MG capsule Take 100 mg by mouth 2 (two) times daily.    Historical Provider, MD  diazepam (VALIUM) 5 MG tablet Take 1 tablet (5 mg total) by mouth every 6 (six) hours as needed for anxiety (spasms). 06/21/15   Arby BarretteMarcy Pfeiffer, MD    ibuprofen (ADVIL,MOTRIN) 200 MG tablet Take 200 mg by mouth every 6 (six) hours as needed for fever.    Historical Provider, MD  meclizine (ANTIVERT) 25 MG tablet Take 1 tablet (25 mg total) by mouth 3 (three) times daily. Prn dizziness Patient not taking: Reported on 01/03/2015 01/04/14   Hayden Rasmussenavid Mabe, NP  ondansetron (ZOFRAN) 4 MG tablet Take 1 tablet (4 mg total) by mouth every 6 (six) hours. Patient not taking: Reported on 01/03/2015 01/04/14   Hayden Rasmussenavid Mabe, NP  traZODone (DESYREL) 100 MG tablet Take 100 mg by mouth at bedtime as needed for sleep.    Historical Provider, MD    Family History No family history on file.  Social History Social History  Substance Use Topics  . Smoking status: Never Smoker  . Smokeless tobacco: Never Used  . Alcohol use Yes     Comment: very rare     Allergies   Anaprox [naproxen sodium]   Review of Systems Review of Systems  Gastrointestinal: Positive for abdominal pain and vomiting.  Neurological: Positive for dizziness.  All other systems reviewed and are negative.    Physical Exam Updated Vital Signs BP 125/85 (BP Location: Right Arm)   Pulse 66   Temp 98.2 F (36.8 C) (Oral)   Resp 16   Wt 170 lb (77.1 kg)   SpO2 99%   BMI 25.85 kg/m  Physical Exam  Constitutional: She is oriented to person, place, and time. She appears well-nourished.  Slightly dehydrated   HENT:  Head: Normocephalic.  MM slightly dry   Eyes: EOM are normal. Pupils are equal, round, and reactive to light.  Neck: Normal range of motion.  Cardiovascular: Normal rate, regular rhythm and normal heart sounds.   Pulmonary/Chest: Effort normal and breath sounds normal.  Abdominal: Soft. Bowel sounds are normal. She exhibits no distension. There is no tenderness. There is no guarding.  No epigastric or RUQ tenderness   Musculoskeletal: Normal range of motion.  Neurological: She is alert and oriented to person, place, and time.  Skin: Skin is warm.  Psychiatric: She  has a normal mood and affect.  Nursing note and vitals reviewed.    ED Treatments / Results  Labs (all labs ordered are listed, but only abnormal results are displayed) Labs Reviewed  COMPREHENSIVE METABOLIC PANEL - Abnormal; Notable for the following:       Result Value   Glucose, Bld 118 (*)    All other components within normal limits  URINALYSIS, ROUTINE W REFLEX MICROSCOPIC (NOT AT [facility]) - Abnormal; Notable for the following:    Bilirubin Urine SMALL (*)    Ketones, ur >80 (*)    All other components within normal limits  LIPASE, BLOOD  CBC  PREGNANCY, URINE    EKG  EKG Interpretation  Date/Time:  Wednesday February 08 2016 13:17:56 EDT Ventricular Rate:  73 PR Interval:  168 QRS Duration: 92 QT Interval:  372 QTC Calculation: 409 R Axis:   86 Text Interpretation:  Normal sinus rhythm Cannot rule out Anterior infarct , age undetermined Abnormal ECG No significant change since last tracing Confirmed by YAO  MD, DAVID ([id number]) on 02/08/2016 5:34:55 PM       Radiology No results found.  Procedures Procedures (including critical care time)  Medications Ordered in ED Medications  ondansetron (ZOFRAN-ODT) disintegrating tablet 4 mg (4 mg Oral Given 02/08/16 1332)  sodium chloride 0.9 % bolus 1,000 mL (0 mLs Intravenous Stopped 02/08/16 1930)  ondansetron (ZOFRAN) injection 4 mg (4 mg Intravenous Given 02/08/16 1822)  sodium chloride 0.9 % bolus 1,000 mL (1,000 mLs Intravenous New Bag/Given 02/08/16 2007)     Initial Impression / Assessment and Plan / ED Course  I have reviewed the triage vital signs and the nursing notes.  Pertinent labs & imaging results that were available during my care of the patient were reviewed by me and considered in my medical decision making (see chart for details).  Clinical Course   Stefanie Galvan is a 50 y.o. female here with abdominal pain, vomiting, diarrhea. Likely gastro. Abdomen nontender. Will get labs, UA, will hydrate and  reassess.   9:06 PM Given 2 L NS and zofran. Tolerated PO fluids and crackers. Labs unremarkable. UA + ketones likely from gastro. Will dc home with zofran prn.    Final Clinical Impressions(s) / ED Diagnoses   Final diagnoses:  None    New Prescriptions New Prescriptions   No medications on file     Charlynne Panderavid Hsienta Yao, MD 02/08/16 2106

## 2016-02-08 NOTE — ED Triage Notes (Signed)
Pt reports n/v since yesterday and last night. This morning has felt dizzy, tried to eat toast and drink lemonade. N/v persists, thinks she is dehydrated. Pt is a x 4.

## 2016-02-08 NOTE — ED Notes (Signed)
Gave pt crackers and gingerale 

## 2016-02-08 NOTE — Discharge Instructions (Signed)
Stay hydrated.   Take zofran for nausea and wait 20 minutes then drink liquids and eat crackers.   Try some gatorade.   See your doctor.   Rest at home tomorrow   Return to ER if you have worse abdominal cramps, vomiting, diarrhea, fevers

## 2016-02-28 ENCOUNTER — Encounter (INDEPENDENT_AMBULATORY_CARE_PROVIDER_SITE_OTHER): Payer: 59 | Admitting: Podiatry

## 2016-02-28 DIAGNOSIS — M79673 Pain in unspecified foot: Secondary | ICD-10-CM

## 2016-02-28 NOTE — Progress Notes (Signed)
This encounter was created in error - please disregard.

## 2016-08-20 ENCOUNTER — Ambulatory Visit: Payer: 59 | Admitting: Adult Health

## 2016-08-29 DIAGNOSIS — M1990 Unspecified osteoarthritis, unspecified site: Secondary | ICD-10-CM | POA: Insufficient documentation

## 2017-02-28 ENCOUNTER — Other Ambulatory Visit (HOSPITAL_COMMUNITY): Payer: PRIVATE HEALTH INSURANCE | Attending: Psychiatry | Admitting: Licensed Clinical Social Worker

## 2017-02-28 ENCOUNTER — Encounter (INDEPENDENT_AMBULATORY_CARE_PROVIDER_SITE_OTHER): Payer: Self-pay

## 2017-02-28 DIAGNOSIS — F4312 Post-traumatic stress disorder, chronic: Secondary | ICD-10-CM | POA: Insufficient documentation

## 2017-02-28 DIAGNOSIS — F4310 Post-traumatic stress disorder, unspecified: Secondary | ICD-10-CM

## 2017-02-28 DIAGNOSIS — Z62811 Personal history of psychological abuse in childhood: Secondary | ICD-10-CM | POA: Diagnosis not present

## 2017-02-28 DIAGNOSIS — F314 Bipolar disorder, current episode depressed, severe, without psychotic features: Secondary | ICD-10-CM

## 2017-02-28 DIAGNOSIS — Z91410 Personal history of adult physical and sexual abuse: Secondary | ICD-10-CM | POA: Diagnosis not present

## 2017-02-28 DIAGNOSIS — M1990 Unspecified osteoarthritis, unspecified site: Secondary | ICD-10-CM | POA: Insufficient documentation

## 2017-02-28 DIAGNOSIS — Z62810 Personal history of physical and sexual abuse in childhood: Secondary | ICD-10-CM

## 2017-02-28 DIAGNOSIS — F3181 Bipolar II disorder: Secondary | ICD-10-CM | POA: Insufficient documentation

## 2017-02-28 DIAGNOSIS — Z888 Allergy status to other drugs, medicaments and biological substances status: Secondary | ICD-10-CM | POA: Insufficient documentation

## 2017-03-01 ENCOUNTER — Encounter (HOSPITAL_COMMUNITY): Payer: Self-pay

## 2017-03-01 ENCOUNTER — Other Ambulatory Visit (HOSPITAL_COMMUNITY): Payer: PRIVATE HEALTH INSURANCE | Admitting: Licensed Clinical Social Worker

## 2017-03-01 VITALS — BP 114/70 | HR 86 | Ht 66.0 in | Wt 215.0 lb

## 2017-03-01 DIAGNOSIS — Z888 Allergy status to other drugs, medicaments and biological substances status: Secondary | ICD-10-CM | POA: Diagnosis not present

## 2017-03-01 DIAGNOSIS — F4312 Post-traumatic stress disorder, chronic: Secondary | ICD-10-CM | POA: Diagnosis not present

## 2017-03-01 DIAGNOSIS — F3181 Bipolar II disorder: Secondary | ICD-10-CM | POA: Diagnosis not present

## 2017-03-01 DIAGNOSIS — F329 Major depressive disorder, single episode, unspecified: Secondary | ICD-10-CM | POA: Diagnosis present

## 2017-03-01 DIAGNOSIS — F314 Bipolar disorder, current episode depressed, severe, without psychotic features: Secondary | ICD-10-CM

## 2017-03-01 DIAGNOSIS — F4310 Post-traumatic stress disorder, unspecified: Secondary | ICD-10-CM

## 2017-03-01 DIAGNOSIS — M1990 Unspecified osteoarthritis, unspecified site: Secondary | ICD-10-CM | POA: Diagnosis not present
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
## 2017-03-01 NOTE — Psych (Signed)
   [facility] BH PHP THERAPIST PROGRESS NOTE  YASIRA ENGELSON [id number]  Session Time: 9 am - 1 pm  Participation Level: Active  Behavioral Response: CasualAlertDepressed  Type of Therapy: Group Therapy  Treatment Goals addressed: Coping  Interventions: CBT, DBT, Supportive and Reframing  Summary: 9:00 - 10:30: Clinician led check-in regarding current stressors and situation, and review of patient completed daily inventory. Clinician utilized active listening and empathetic response and validated patient emotions. Clinician facilitated processing group on pertinent issues.  10:30 -12:00: Clinician introduced topic of "Feelings and Emotions". Patients discussed how hard emotions and feelings can be to identify, accept, and handle.  12:00-12:50: Clinician introduced topic of "Emotional Regulation". Group discussed emotional regulation skills such as PLEASE and pleasant events. Patients identified ways they could use these skills in everyday life.  12:50 - 1:00: Clinician led check-out. Clinician assessed for immediate needs, medication compliance and efficacy, and safety concerns.    Suicidal/Homicidal: Nowithout intent/plan  Therapist Response: DAVIS VANNATTER is a 51 y.o. female who presents with bipolar and trauma symptoms. Patient arrived within time allowed and reports feeling "okay." Patient rates her mood at a 6 on a 1-10 scale with 10 being great. Patient reports that she dreaded waking up this morning because she was going to have to face her trigger. Patient shared that she talked to her friend that is a nurse that helps relieve her anxiety. Patient engaged in activity and discussion. Patient demonstrates some progress as evidenced by verbalizing ways she avoids her triggers that makes her upset. Patient reported no SI/HI/ or self-harm at the end of group.    Plan: Pt will continue in PHP to increase ability to manage emotions and distress while decreasing depression and  trauma symptoms.   Diagnosis: Bipolar disorder, current episode depressed, severe, without psychotic features (HCC) [F31.4]    1. Bipolar disorder, current episode depressed, severe, without psychotic features (HCC)   2. Posttraumatic stress disorder       [name], LCSW 03/01/2017

## 2017-03-04 ENCOUNTER — Other Ambulatory Visit (HOSPITAL_COMMUNITY): Payer: PRIVATE HEALTH INSURANCE | Admitting: Licensed Clinical Social Worker

## 2017-03-04 ENCOUNTER — Encounter (HOSPITAL_COMMUNITY): Payer: Self-pay | Admitting: Psychiatry

## 2017-03-04 DIAGNOSIS — F3181 Bipolar II disorder: Secondary | ICD-10-CM | POA: Insufficient documentation

## 2017-03-04 DIAGNOSIS — M5136 Other intervertebral disc degeneration, lumbar region: Secondary | ICD-10-CM | POA: Insufficient documentation

## 2017-03-04 DIAGNOSIS — F4312 Post-traumatic stress disorder, chronic: Secondary | ICD-10-CM
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
## 2017-03-05 ENCOUNTER — Encounter (HOSPITAL_COMMUNITY): Payer: Self-pay | Admitting: Occupational Therapy

## 2017-03-05 ENCOUNTER — Other Ambulatory Visit (HOSPITAL_COMMUNITY): Payer: PRIVATE HEALTH INSURANCE | Admitting: Occupational Therapy

## 2017-03-05 ENCOUNTER — Other Ambulatory Visit (HOSPITAL_COMMUNITY): Payer: PRIVATE HEALTH INSURANCE | Admitting: Licensed Clinical Social Worker

## 2017-03-05 DIAGNOSIS — F4310 Post-traumatic stress disorder, unspecified: Secondary | ICD-10-CM

## 2017-03-05 DIAGNOSIS — F314 Bipolar disorder, current episode depressed, severe, without psychotic features: Secondary | ICD-10-CM

## 2017-03-05 DIAGNOSIS — F3181 Bipolar II disorder: Secondary | ICD-10-CM | POA: Diagnosis not present

## 2017-03-05 NOTE — Psych (Signed)
   [facility] BH PHP THERAPIST PROGRESS NOTE  Stefanie Galvan [id number]  Session Time: 9 am - 2 pm  Participation Level: Active  Behavioral Response: CasualAlertAnxious and Depressed  Type of Therapy: Group Therapy, Psychoeducation, Psychotherapy, Occupational Therapy  Treatment Goals addressed: Coping  Interventions: CBT, DBT, Supportive and Reframing  Summary:  9:00 -10:30: Clinician led check-in regarding current stressors and situation, and review of patient completed daily inventory. Clinician utilized active listening and empathetic response and validated patient emotions. Clinician facilitated processing group on pertinent issues.  10:30 - 11:30: OT Group  11:30 - 12:15: Clinician led group in a discussion about trust, sharing with others, and free choice about what information is shared with whom.  12:15 - 1:00: Reflection group: Patients encouraged to practice skills and interpersonal techniques or work on mindfulness and relaxation techniques. The importance of self-care and making skills part of a routine to increase usage were stressed.  1:00-1:50: Clinician introduced topic of "Values and Goals". Group discussed why values and having goals are important and why it is helpful to know what values/goals are most important to self. Group discussed why certain values are more important to self than others. Group completed a goals worksheet to help narrow down steps to working towards a goal.  1:50 - 2:00 Clinician led check-out. Clinician assessed for immediate needs, medication compliance and efficacy, and safety concerns.   Suicidal/Homicidal: Nowithout intent/plan  Therapist Response: Stefanie Galvan is a 51 y.o. female who presents with bipolar and trauma symptoms. Patient arrived within time allowed and reports feeling "down." Patient rates her mood at a 4.5 on a 1-10 scale with 10 being great. Pt reports she had a great evening spending time with her oldest son. Pt  states she is feeling more hopeful due to learning new things in group. Pt discussed using mindfulness while crocheting a blanket and how it was helpful to keep racing thoughts at bay. Pt reports her depression is down somewhat today, but anxiety is still an issue. Group discussed some deep breathing techniques to help pt. Pt reports she tried a mindfulness meditation video this morning before group, and was mad that it did not last longer because it helped her relax so much. Pt reports she will continue to use meditation. Patient engaged in activity and discussion. Patient demonstrates some progress as evidenced by being openness to trying new skills. Patient reported no SI/HI/ or self-harm at the end of group.   Plan: Pt will continue in PHP to increase ability to manage emotions and distress while decreasing depression and trauma symptoms.   Diagnosis: Bipolar disorder, current episode depressed, severe, without psychotic features (HCC) [F31.4]    1. Bipolar disorder, current episode depressed, severe, without psychotic features (HCC)   2. Posttraumatic stress disorder    [name], LPCA 03/05/2017

## 2017-03-05 NOTE — Psych (Signed)
   [facility] BH PHP THERAPIST PROGRESS NOTE  Stefanie Galvan [id number]  Session Time: 9 am - 2 pm  Participation Level: Active  Behavioral Response: CasualAlertDepressed  Type of Therapy: Group Therapy, Psychoeducation, Psychotherapy, Pharmacist Group  Treatment Goals addressed: Coping  Interventions: CBT, DBT, Supportive and Reframing  Summary: 9:00 - 10:30 Pharmacist discussed medication with group and answered questions.  10:30 - 12:00 Clinician led check-in regarding current stressors and situation, and review of patient completed daily inventory. Clinician utilized active listening and empathetic response and validated patient emotions. Clinician facilitated processing group on pertinent issues.  12:00 - 12:45 Reflection group: Patients encouraged to practice skills and interpersonal techniques or work on mindfulness and relaxation techniques. The importance of self-care and making skills part of a routine to increase usage when stressed.  12:45 - 1:50 Clinician introduced "Mindfulness". Clinician showed Ted[employer] on mindfulness and the group discussed. Group discussed "What" and "How" skills of mindfulness. Patients identified what types of activities would help them practice mindfulness. Patients took part in a body scan activity and other activities and discussed which would work best for them.  1:50 - 2:00 Clinician led check-out. Clinician assessed for immediate needs, medication compliance and efficacy, and safety concerns.   Suicidal/Homicidal: Nowithout intent/plan  Therapist Response: Stefanie Galvan is a 51 y.o. female who presents with bipolar and trauma symptoms. Patient arrived within time allowed and reports feeling "down." Patient rates her mood at a 4.5 on a 1-10 scale with 10 being great. Patient reports that she had a hard weekend with her mom, but was able to discuss with her mom about how she is feeling.  Pt reports she felt this conversation was hard but good  because her mother actually accepted what she was saying.  Pt reports she had SI over the weekend and went to sleep to deal with it.  Pt reports she has hope because of group. Patient engaged in activity and discussion. Pt reports she wants to try meditation to help her with mindfulness.  Patient demonstrates some progress as evidenced by being open and honest in group despite anxiety. Patient reported no SI/HI/ or self-harm at the end of group.   Plan: Pt will continue in PHP to increase ability to manage emotions and distress while decreasing depression and trauma symptoms.   Diagnosis: Bipolar 2 disorder, major depressive episode (HCC) [F31.81]    1. Bipolar 2 disorder, major depressive episode (HCC)   2. Chronic post-traumatic stress disorder (PTSD)    [name], LPCA 03/05/2017

## 2017-03-05 NOTE — Therapy (Signed)
[facility] PARTIAL HOSPITALIZATION PROGRAM [address] [place], [place], [address] Phone: [phone]   Fax:  [phone]  Occupational Therapy Evaluation  Patient Details  Name: Stefanie Galvan MRN: [id number] Date of Birth: [birth year] Referring Provider: Dr. Carolanne Grumbling  Encounter Date: 03/05/2017      OT End of Session - 03/05/17 1245    Visit Number 1   Number of Visits 6   Date for OT Re-Evaluation 03/29/17   Authorization Type [employer]   OT Start Time 1030   OT Stop Time 1130   OT Time Calculation (min) 60 min   Activity Tolerance Patient tolerated treatment well   Behavior During Therapy [facility] for tasks assessed/performed      Past Medical History:  Diagnosis Date  . Arthritis   . Depression 2.5 yrs ago   situational dep-no current tx/meds    Past Surgical History:  Procedure Laterality Date  . BREAST REDUCTION SURGERY  02/25/2012   Procedure: MAMMARY REDUCTION  (BREAST);  Surgeon: Louisa Second, MD;  Location:  SURGERY CENTER;  Service: Plastics;  Laterality: Bilateral;  bilateral reduction  . DILATION AND CURETTAGE OF UTERUS  2001  . TEMPOROMANDIBULAR JOINT SURGERY  1984; 1996   X 2; bilateral  . TUBAL LIGATION  2003    There were no vitals filed for this visit.      Subjective Assessment - 03/05/17 1244    Currently in Pain? No/denies           [facility] OT Assessment - 03/05/17 1244      Assessment   Diagnosis Bipolar Disorder   Referring Provider Dr. Carolanne Grumbling   Onset Date --  chronic     Precautions   Precautions None     Restrictions   Weight Bearing Restrictions No     Balance Screen   Has the patient fallen in the past 6 months No   Has the patient had a decrease in activity level because of a fear of falling?  No   Is the patient reluctant to leave their home because of a fear of falling?  No       OT assessment:  Diagnosis:  Bipolar Disorder, PTSD Past medical history:  Depression Living situation:  Mother, stepfather, brother ADLs:  Independent Work:  Currently on short-term disability from [employer] Leisure: crocheting, helping others, going to church Social support: 3 children Struggles:  depression, anxiety, sleeping too much, difficulty concentrating OT goal:  Learning coping skills    General Causality Orientation Scale    Subscore Percentile Score  Autonomy  59  68.98  Control  35  8.50  Impersonal  33  10.06    Motivation Type  Motivation type Explanation   Autonomy-oriented The individual is clear about what he or she is doing.  There is clear connection between behavior and interest/personal goals.  Motivation is intact.  Assessment:  Patient demonstrates autonomy-oriented motivation type.  Pt has clear connection between behavior and interest/personal goals. Patient will benefit from occupational therapy intervention in order to improve time management, financial management, stress management, job readiness skills, social skills, sleep hygiene, exercise and healthy eating habits, and health management skills and other psychosocial skills needed for preparation to return to full time community living and to be a productive community member.     Plan:  Patient will participate in skilled occupational therapy sessions individually or in a group setting to improve coping skills, psychosocial skills, and emotional skills required to  return to prior level of function as a productive community member. Treatment will be 1-2 times per week for 2-6 weeks.        OT Group: Communication Skills-Active Listening and Open Discussion   S:  "I think people can become addicted to social media."   O:  Patient actively participated in the following skilled occupational therapy treatment session this date:             Social and communication skills: Pt participated in group focusing on active listening and open communication via dialogue. Pt  participated in small group/pair activity-participants given a subject to discuss. One person talks about the subject for 3 minutes while the other person practices active listening; then the listener recapped what the speaker said but did not include opinions or personal thoughts. Pairs/groups then switch roles for the next subject. At end of small group activity all participants reconvene for large group discussion on a potentially controversial topic. Today's topic was social media and whether it is good or bad. Pt was actively engaged during both small and large group activities and contributed to the discussion.    A:  Patient participated in skilled occupational therapy group for social and communication skills this date.  Patient was engaged during activities and provided thoughts and feelings on session.      P:  Continue participation in skilled occupational therapy groups  1-2 times per week for 2 weeks in order to gain the necessary skills needed to return to full time community living and learn effective coping strategies to be a productive community resident.         OT Short Term Goals - 03/05/17 1250      OT SHORT TERM GOAL #1   Title Patient will be educated on strategies to improve psychosocial skills needed to participate fully in all daily, work, and leisure activities.   Time 3   Period Weeks   Status New   Target Date 03/29/17     OT SHORT TERM GOAL #2   Title Patient will be educated on a HEP and independent with implementation of HEP.   Time 3   Period Weeks   Status New     OT SHORT TERM GOAL #3   Title Patient will independently apply psychosocial skills and coping mechanisms to her daily activities in order to function independently.   Time 3   Period Weeks   Status New                  Plan - 03/05/17 1246    Occupational performance deficits (Please refer to evaluation for details): ADL's;IADL's;Rest and Sleep;Education;Work;Leisure;Social  Participation   Rehab Potential Good   OT Frequency 2x / week   OT Duration --  3 weeks   OT Treatment/Interventions Self-care/ADL training;Patient/family education;Other (comment)  coping skills training, psychosocial skills training, community reintegration   Clinical Decision Making Limited treatment options, no task modification necessary   Consulted and Agree with Plan of Care Patient      Patient will benefit from skilled therapeutic intervention in order to improve the following deficits and impairments:   (decreased coping skills, decreased psychosocial skills)  Visit Diagnosis: Bipolar disorder, current episode depressed, severe, without psychotic features (HCC)  Posttraumatic stress disorder    Problem List Patient Active Problem List   Diagnosis Date Noted  . Bipolar 2 disorder, major depressive episode (HCC) 03/04/2017    Class: Chronic  . Chronic post-traumatic stress disorder (PTSD) 03/04/2017  Class: Chronic   Ezra Sites, OTR/L  [phone] 03/05/2017, 12:52 PM  [facility] PARTIAL HOSPITALIZATION PROGRAM [address] [place], [place], [address] Phone: [phone]   Fax:  [phone]  Name: Stefanie Galvan MRN: [id number] Date of Birth: Feb 11, 1966

## 2017-03-06 ENCOUNTER — Other Ambulatory Visit (HOSPITAL_COMMUNITY): Payer: PRIVATE HEALTH INSURANCE | Admitting: Licensed Clinical Social Worker

## 2017-03-06 ENCOUNTER — Encounter (HOSPITAL_COMMUNITY): Payer: Self-pay

## 2017-03-06 VITALS — BP 110/68 | HR 72 | Ht 68.0 in | Wt 214.0 lb

## 2017-03-06 DIAGNOSIS — F3181 Bipolar II disorder: Secondary | ICD-10-CM | POA: Diagnosis not present

## 2017-03-06 DIAGNOSIS — F4310 Post-traumatic stress disorder, unspecified: Secondary | ICD-10-CM

## 2017-03-06 DIAGNOSIS — F314 Bipolar disorder, current episode depressed, severe, without psychotic features: Secondary | ICD-10-CM

## 2017-03-06 NOTE — Psych (Signed)
   [facility] BH PHP THERAPIST PROGRESS NOTE  Stefanie Galvan [id number]  Session Time: 9 am - 2 pm  Participation Level: Active  Behavioral Response: CasualAlertAnxious and Depressed  Type of Therapy: Group Therapy, Psychoeducation, Psychotherapy, Spiritual Care, Activity therapy  Treatment Goals addressed: Coping  Interventions: CBT, DBT, Supportive and Reframing  Summary:  9:00 - 10:30 Clinician led check-in regarding current stressors and situation, and review of patient completed daily inventory. Clinician utilized active listening and empathetic response and validated patient emotions. Clinician facilitated processing group on pertinent issues.  10:30 -12:00 Spiritual care group  12:00 - 12:45 Reflection group: Patients encouraged to practice skills and interpersonal techniques or work on mindfulness and relaxation techniques. The importance of self-care and making skills part of a routine to increase usage were stressed.  12:45 - 1:50 Relaxation group: Cln Forde Radon led yoga group focused on retraining the body's response to stress.  1:50 - 2:00 Clinician led check-out. Clinician assessed for immediate needs, medication compliance and efficacy, and safety concerns.    Suicidal/Homicidal: Nowithout intent/plan  Therapist Response: Stefanie Galvan is a 51 y.o. female who presents with bipolar and trauma symptoms. Patient arrived within time allowed and reports feeling "pretty good." Patient rates her mood at a 8 on a 1-10 scale with 10 being great. Pt reports she spent time with a friend yesterday and that went well. Pt continues to knit and is working on incorporating self care activities. Patient engaged in activity and discussion. Patient demonstrates some progress as evidenced by reporting some increased hopefulness. Patient reported no SI/HI/ or self-harm at the end of group.   Plan: Pt will continue in PHP to increase ability to manage emotions and distress while  decreasing depression and trauma symptoms.   Diagnosis: Bipolar disorder, current episode depressed, severe, without psychotic features (HCC) [F31.4]    1. Bipolar disorder, current episode depressed, severe, without psychotic features (HCC)   2. Posttraumatic stress disorder    [name], LCSW 03/06/2017

## 2017-03-06 NOTE — Progress Notes (Signed)
Met with patient today as she presented with flat affect, labile mood and admitted the past few days have been a little strange to her as she has felt her mood somewhat elevated with a three day shopping spree and without the funds to really support this as she acknowledged. Patient stated, however, she is sleeping more 8-10 hours both of the past 2 nights and did not present with any pressured speech or racing thoughts currently.  Patient rated her depression a 2-3, anxiety a 5 and hopelessness a 2-3 on a scale of 0-10 with 0 being none and 10 the worst she could experience.  Patient reported she had an open and productive conversation with her Mother on 03/02/17 and thought for the first time in a long time her mother "gets it" and has been more supportive the past few days.  Patient denied any thoughts of wanting to harm herself or others with no plan or intent.  States she is talking to someone now who is interested in dating her and is being honest with him about currently attending PHP.  Patient admitted with her traumatic history she often does not trust herself with relationships and will continue to address this with therapy and groups.  Patient stated she just feel "tired" most of the time and stated she is "tired of being tired".  States she does not like her current job and would like not to have to return there.  Hopes she can stay in group for a few weeks as feels it is really helping with coping skills and is also looking at other potential job positions as an option for later.  Patient stable at this time and will keep PHP staff informed of any problems with medications.  States she will have most recent lithium level faxed to our office for Dr. Lovena Le to review and reviewed all current medications with her.  Patient to keep [place] informed if mood changes become an issue with elevation or increased depression, if any changes in sleep or appetite.  Patient reported liking PHP and returned to group today  as states she feels it is supportive.

## 2017-03-06 NOTE — Progress Notes (Signed)
03/06/2017 10:30-12:00.  Pt attended spirituality group facilitated by chaplain Burnis Kingfisher, MDiv    Spirituality group focused on topic of "hope."   Pts responded to topic and, engaged in facilitated dialog, expressing their understanding of topic and where they see this in their life.   Group facilitation drew on elements of client-centered, humanist / existential and psycho-dynamic group process.   Selena Batten was present throughout group.  Presented with appropriate affect, alert and engaged in group process voluntarily.  Periodically tearful while speaking, she also noted at times she was feeling anxious and related this to a tightness in her chest.  Was knitting in order to cope with anxiety and stated this was helping.    [name] engaged in group discussion after resonating with another group member who reported how trauma during childhood affects her ability to hold on to hope.  Selena Batten feels her story is similar and expresses appreciation that this group member had family that she finds helpful.  [name] described not having support from mother.  Finds hope in church, but related though the church services and community serve to enrich her faith and to "get me through the weekend, she does not "go into the deep end" with anyone in her community there.   Though she has one friend who is close, she feels this friend engages with her through a "work capacity" and she cannot lean on this friend.  She also describes caution around looking to her children for support, as she understands her role as their mother to support them and not burden them.   [name] expressed appreciation for the group, describing this was one of the first places she has felt "understood" and "comfortable."    Expressed some fear that she would not be able to find this outside of group . Group members normalized this feeling and were supportive through speaking about their own processes.  Group members offered suggestions of places she may find  similar support in community, such as mental health association.     [facility] / [facility] Chaplain Burnis Kingfisher, MDiv

## 2017-03-07 ENCOUNTER — Other Ambulatory Visit (HOSPITAL_COMMUNITY): Payer: PRIVATE HEALTH INSURANCE | Admitting: Licensed Clinical Social Worker

## 2017-03-07 ENCOUNTER — Encounter (HOSPITAL_COMMUNITY): Payer: Self-pay

## 2017-03-07 ENCOUNTER — Other Ambulatory Visit (HOSPITAL_COMMUNITY): Payer: PRIVATE HEALTH INSURANCE | Admitting: Specialist

## 2017-03-07 DIAGNOSIS — F4310 Post-traumatic stress disorder, unspecified: Secondary | ICD-10-CM

## 2017-03-07 DIAGNOSIS — F3181 Bipolar II disorder: Secondary | ICD-10-CM | POA: Diagnosis not present

## 2017-03-07 DIAGNOSIS — F314 Bipolar disorder, current episode depressed, severe, without psychotic features: Secondary | ICD-10-CM

## 2017-03-07 NOTE — Progress Notes (Signed)
Progress note      Doing better today.  Still sad to be losing the whole group she related so well to.  Feels safe here.  Does not want to return to her current job.  It is toxic to her mental health she believes.  Talked about her mix of introverted and extroverted traits.  Anxiety is better today.  Depression is much better today.  Tried no medication for sleep last night and the result was no sleep.  Plan:  Continue escitalopram 10 mg daily, hydroxyzine 50 mg hs prn, lithium 300 mg tid daily, trazodone 100 mg hs prn.            Last lithium level was 1.0 on 02/27/2017 and TSH was 3.4

## 2017-03-07 NOTE — Psych (Signed)
   [facility] BH PHP THERAPIST PROGRESS NOTE  Stefanie Galvan [id number]  Session Time: 9 am - 2 pm  Participation Level: Active  Behavioral Response: CasualAlertAnxious and Depressed  Type of Therapy: Group Therapy, Psychoeducation, Psychotherapy, Occupational Therapy  Treatment Goals addressed: Coping  Interventions: CBT, DBT, Supportive and Reframing  Summary:  9:00 - 10:30 Clinician led check-in regarding current stressors and situation, and review of patient completed daily inventory. Clinician utilized active listening and empathetic response and validated patient emotions. Clinician facilitated processing group on pertinent issues.  10:30 -11:30: OT Group  11:30-12:15: Clinician led psychoeducation group on distress tolerance. The STOP skill was introduced, and patients discussed how to utilize it. Patients identified when this technique may be helpful in their personal lives.  12:15 - 1:00: Reflection group: Patients encouraged to practice skills and interpersonal techniques or work on mindfulness and relaxation techniques. The importance of self-care and making skills part of a routine to increase usage were stressed.  1:00 - 1:50 Clinician continued topic of "Distress Tolerance". Group discussed "ACCEPTS" and how/when patients can employ this method to help. Patients identified when this technique may be helpful in their personal lives.  1:50 - 2:00 Clinician led check-out. Clinician assessed for immediate needs, medication compliance and efficacy, and safety concerns.    Suicidal/Homicidal: Nowithout intent/plan  Therapist Response: Stefanie Galvan is a 51 y.o. female who presents with bipolar and trauma symptoms. Patient arrived within time allowed and reports she is feeling "not bad but not good." Patient rates her mood at a 5 on a 1-10 scale with 10 being great. Patient reports she did not sleep good last night due to not taking her sleep medications. Patient reported  that she is having to put her family dog down this afternoon after 17 years. Patient stated that she does not understand why she feels "numb" to having to put her dog down, as she feels as though she should be emotional about it. Patient engaged in activity and discussion. Patient demonstrates some progress as evidenced by stating ways she is going to keep her emotions stable this afternoon if she happens get upset about her dog. Patient stated she was going to knit. Patient denies SI/HI/self-harm thoughts at the end of group.     Plan: Pt will continue in PHP to increase ability to manage emotions and distress while decreasing depression and trauma symptoms.   Diagnosis: Bipolar 2 disorder (HCC) [F31.81]    1. Bipolar 2 disorder (HCC)   2. PTSD (post-traumatic stress disorder)    [name], LCSW 03/07/2017

## 2017-03-07 NOTE — Therapy (Signed)
[facility] PARTIAL HOSPITALIZATION PROGRAM [address] [place], [place], [address] Phone: [phone]   Fax:  [phone]  Occupational Therapy Treatment  Patient Details  Name: Stefanie Galvan MRN: [id number] Date of Birth: March 01, 1966 Referring Provider: Dr. Carolanne Grumbling  Encounter Date: 03/07/2017      OT End of Session - 03/07/17 1458    Visit Number 2   Number of Visits 6   Date for OT Re-Evaluation 03/29/17   Authorization Type [employer]   OT Start Time 1030   OT Stop Time 1130   OT Time Calculation (min) 60 min   Activity Tolerance Patient tolerated treatment well   Behavior During Therapy [facility] for tasks assessed/performed      Past Medical History:  Diagnosis Date  . Arthritis   . Depression 2.5 yrs ago   situational dep-no current tx/meds    Past Surgical History:  Procedure Laterality Date  . BREAST REDUCTION SURGERY  02/25/2012   Procedure: MAMMARY REDUCTION  (BREAST);  Surgeon: Louisa Second, MD;  Location: [facility];  Service: Plastics;  Laterality: Bilateral;  bilateral reduction  . DILATION AND CURETTAGE OF UTERUS  2001  . TEMPOROMANDIBULAR JOINT SURGERY  1984; 1996   X 2; bilateral  . TUBAL LIGATION  2003    There were no vitals filed for this visit.      Subjective Assessment - 03/07/17 1457    Currently in Pain? No/denies        S:  I am going to start daily affirmations. O:  Patient participated in stress relief group focusing on protective factors today.  Patient was educated on protective factors as they assist in building resiliency when dealing with day to day challenges.  Protective factors discussed included social support, coping skills, physical health, sense of purpose, self-esteem, and healthy thinking.  Group members rated their performance in each area, identified a factor that had benefited them in the past.  Patient also identified 2 protective factors that they felt they could  improve upon, how life would be different if they improved these areas, and identified specific actions they will take to improve this area.  Lastly, patient's drew a picture of what action they were going to take and shared with the group.   A:  Patient identified that she does have a support system and is wary to use it as she fears she may be a burden to her family. P:  Continue skilled OT group 2 times per week for 3 weeks.  Group focus will be on communication skills.                        OT Education - 03/07/17 1457    Education provided Yes   Education Details Educated patient on protective factors as an aid to overcoming daily challenges   Person(s) Educated Patient   Methods Explanation;Handout   Comprehension Verbalized understanding          OT Short Term Goals - 03/07/17 1459      OT SHORT TERM GOAL #1   Title Patient will be educated on strategies to improve psychosocial skills needed to participate fully in all daily, work, and leisure activities.   Time 3   Period Weeks   Status On-going     OT SHORT TERM GOAL #2   Title Patient will be educated on a HEP and independent with implementation of HEP.   Time 3   Period  Weeks   Status On-going     OT SHORT TERM GOAL #3   Title Patient will independently apply psychosocial skills and coping mechanisms to her daily activities in order to function independently.   Time 3   Period Weeks   Status On-going                Patient will benefit from skilled therapeutic intervention in order to improve the following deficits and impairments:   (decreased coping skills, decreased psychosocial skills)  Visit Diagnosis: Bipolar disorder, current episode depressed, severe, without psychotic features [facility])    Problem List Patient Active Problem List   Diagnosis Date Noted  . Bipolar 2 disorder, major depressive episode (HCC) 03/04/2017    Class: Chronic  . Chronic post-traumatic stress  disorder (PTSD) 03/04/2017    Class: Chronic    Shirlean Mylar, MHA, OTR/L [phone]  03/07/2017, 2:59 PM  [facility] PARTIAL HOSPITALIZATION PROGRAM [address] [place], [place], [address] Phone: [phone]   Fax:  [phone]  Name: Stefanie Galvan MRN: [id number] Date of Birth: 1965-12-20

## 2017-03-08 ENCOUNTER — Other Ambulatory Visit (HOSPITAL_COMMUNITY): Payer: PRIVATE HEALTH INSURANCE | Admitting: Licensed Clinical Social Worker

## 2017-03-08 DIAGNOSIS — F314 Bipolar disorder, current episode depressed, severe, without psychotic features: Secondary | ICD-10-CM

## 2017-03-08 DIAGNOSIS — F3181 Bipolar II disorder: Secondary | ICD-10-CM | POA: Diagnosis not present

## 2017-03-11 ENCOUNTER — Other Ambulatory Visit (HOSPITAL_COMMUNITY): Payer: PRIVATE HEALTH INSURANCE | Admitting: Licensed Clinical Social Worker

## 2017-03-11 DIAGNOSIS — F4310 Post-traumatic stress disorder, unspecified: Secondary | ICD-10-CM

## 2017-03-11 DIAGNOSIS — F3181 Bipolar II disorder: Secondary | ICD-10-CM | POA: Diagnosis not present

## 2017-03-11 DIAGNOSIS — F314 Bipolar disorder, current episode depressed, severe, without psychotic features: Secondary | ICD-10-CM

## 2017-03-11 NOTE — Psych (Signed)
   [facility] BH PHP THERAPIST PROGRESS NOTE  [name] [id number]  Session Time: 9 am - 1 pm  Participation Level: Active  Behavioral Response: CasualAlertDepressed  Type of Therapy: Group Therapy  Treatment Goals addressed: Coping  Interventions: CBT, DBT, Supportive and Reframing  Summary:  9:00 - 10:30 Clinician led check-in regarding current stressors and situation, and review of patient completed daily inventory. Clinician utilized active listening and empathetic response and validated patient emotions. Clinician facilitated processing group on pertinent issues.  10:30 -12:00: Clinician continued with topic of "Distress Tolerance". Clinician discussed "TIPS" and self-soothe, and how/when patients can employ these methods to help. Patients identified when these techniques may be helpful in their personal lives.  12:00- 12:50 Clinician continued with coping skills. Clinician provided patients with a list of coping skills. Patients created "coping skills flashcards" to use in times of need.  12:50 - 1:00 Clinician led check-out. Clinician assessed for immediate needs, medication compliance and efficacy, and safety concerns.    Suicidal/Homicidal: Yeswithout intent/plan  Therapist Response: Stefanie Galvan is a 51 y.o. female who presents with bipolar and trauma symptoms. Patient arrived within time allowed and reports she is feeling "depressed." Patient rates her mood at a 3 on a 1-10 scale with 10 being great. Patient reports she had thoughts last night but used some coping skills to help her manage her thoughts. Patient called her nurse at work, did the STOP sign technique, listened to music, and used check-the-facts skill. Patient reported that she was very upset due to her mother burying their family dog in a blanket that patient gave to her. Patient engaged in activity and discussion. Patient demonstrated some progress as evidenced by stating ways she is going to use coping  skill and therapeutic skills over the weekend if a negative thought arises. Patient denies SI/HI/self-harm at the end of group.   Plan: Pt will continue in PHP to increase ability to manage emotions and distress while decreasing depression and trauma symptoms.   Diagnosis: Bipolar disorder, current episode depressed, severe, without psychotic features (HCC) [F31.4]    1. Bipolar disorder, current episode depressed, severe, without psychotic features (HCC)       [name], LCSW 03/11/2017

## 2017-03-12 ENCOUNTER — Other Ambulatory Visit (HOSPITAL_COMMUNITY): Payer: PRIVATE HEALTH INSURANCE | Admitting: Occupational Therapy

## 2017-03-12 ENCOUNTER — Other Ambulatory Visit (HOSPITAL_COMMUNITY): Payer: PRIVATE HEALTH INSURANCE | Admitting: Licensed Clinical Social Worker

## 2017-03-12 ENCOUNTER — Encounter (HOSPITAL_COMMUNITY): Payer: Self-pay | Admitting: Occupational Therapy

## 2017-03-12 DIAGNOSIS — F3181 Bipolar II disorder: Secondary | ICD-10-CM

## 2017-03-12 DIAGNOSIS — F4310 Post-traumatic stress disorder, unspecified: Secondary | ICD-10-CM

## 2017-03-12 DIAGNOSIS — F314 Bipolar disorder, current episode depressed, severe, without psychotic features: Secondary | ICD-10-CM

## 2017-03-12 NOTE — Psych (Signed)
   [facility] BH PHP THERAPIST PROGRESS NOTE  Stefanie Galvan [id number]  Session Time: 9 am - 2 pm  Participation Level: Active  Behavioral Response: CasualAlertDepressed  Type of Therapy: Group Therapy; Psychotherapy, Medication education; Psychoeducation  Treatment Goals addressed: Coping  Interventions: CBT, DBT, Supportive and Reframing  Summary:  9:00 - 11:00: Pharmacist discussed medication with group and answered questions.  11:00 -12:00: Clinician led check-in regarding current stressors and situation, and review of patient completed daily inventory. Clinician utilized active listening and empathetic response and validated patient emotions. Clinician facilitated processing group on pertinent issues.  12:00 - 12:45 Reflection group: Patients encouraged to practice skills and interpersonal techniques or work on mindfulness and relaxation techniques. The importance of self-care and making skills part of a routine to increase usage were stressed.  12:45 - 1:50 Clinician introduced topic of decision making and the importance of making informed decisions. Cln discussed pros/cons as a way to weigh the options when making a decision. 1:50 - 2:00 Clinician led check-out. Clinician assessed for immediate needs, medication compliance and efficacy, and safety concerns.     Suicidal/Homicidal: Nowithout intent/plan  Therapist Response: [name] is a 51 y.o. female who presents with bipolar and trauma symptoms. Patient arrived within time allowed and reports she is feeling "better than the past few days." Patient rates her mood at a 7 on a 1-10 scale with 10 being great. Patient reports she experiencing SI over the weekend after having an event with her family and feeling "unimportant and unwanted." Pt states she went on a walk in the woods and utilized mindfulness and self soothe to manage the thoughts. Pt reports she applied for other jobs.  Patient engaged in activity and  discussion. Pt utilized pros/cons to think through leaving her current job and different job opportunities. Patient demonstrated some progress as evidenced by increased awareness of her feelings and rationalizing into wise mind. Patient denies SI/HI/self-harm at the end of group.   Plan: Pt will continue in PHP to increase ability to manage emotions and distress while decreasing depression and trauma symptoms.   Diagnosis: Bipolar disorder, current episode depressed, severe, without psychotic features (HCC) [F31.4]    1. Bipolar disorder, current episode depressed, severe, without psychotic features (HCC)   2. PTSD (post-traumatic stress disorder)       [name], LCSW 03/12/2017

## 2017-03-12 NOTE — Therapy (Signed)
[facility] PARTIAL HOSPITALIZATION PROGRAM [address] [place], [place], [address] Phone: [phone]   Fax:  [phone]  Occupational Therapy Treatment  Patient Details  Name: Stefanie Galvan MRN: [id number] Date of Birth: 03-23-1966 Referring Provider: Dr. Carolanne Grumbling  Encounter Date: 03/12/2017      OT End of Session - 03/12/17 1456    Visit Number 3   Number of Visits 6   Date for OT Re-Evaluation 03/29/17   Authorization Type [employer]   OT Start Time 1030   OT Stop Time 1125   OT Time Calculation (min) 55 min   Activity Tolerance Patient tolerated treatment well   Behavior During Therapy [facility] for tasks assessed/performed      Past Medical History:  Diagnosis Date  . Arthritis   . Depression 2.5 yrs ago   situational dep-no current tx/meds    Past Surgical History:  Procedure Laterality Date  . BREAST REDUCTION SURGERY  02/25/2012   Procedure: MAMMARY REDUCTION  (BREAST);  Surgeon: Louisa Second, MD;  Location: [facility];  Service: Plastics;  Laterality: Bilateral;  bilateral reduction  . DILATION AND CURETTAGE OF UTERUS  2001  . TEMPOROMANDIBULAR JOINT SURGERY  1984; 1996   X 2; bilateral  . TUBAL LIGATION  2003    There were no vitals filed for this visit.      Subjective Assessment - 03/12/17 1455    Currently in Pain? No/denies            [facility] OT Assessment - 03/12/17 1455      Assessment   Diagnosis Bipolar Disorder     Precautions   Precautions None       OT Treatment Session: Time Management    S: "I will definitely need time management if I get this new job."  O: Time management session completed with emphasis on skills required, effective versus ineffective time management, importance of structure, along with tips and strategies for success. Patient provided with education on the effects of time management in regards to improving physical, emotional, and social well-being including  improved ability for focus, decision-making skills, success in work, and social commitments, as well as benefits of reduced stress and the experience of greater success in all aspects of daily life.   A: Pt participated in time management occupational therapy treatment session this date within the [facility] program. Pt actively engaged in discussion on 10 tips for time management. Pt completed worksheets looking at her daily schedule, time wasters, and mine for time. Pt completed problem solving on how to incorporate time for herself as well as planning and working towards small goals versus large tasks that are harder to sustain. At end of session pt identified strategies for areas for improvement and making time to walk daily.    P: Pt provided with time management strategies to implement during her daily schedule to assist in improving her balance between self-care, work, leisure, and PHP program tasks. OT will follow up with pt on success or struggles with implementation of learned strategies in 1 week.              OT Short Term Goals - 03/07/17 1459      OT SHORT TERM GOAL #1   Title Patient will be educated on strategies to improve psychosocial skills needed to participate fully in all daily, work, and leisure activities.   Time 3   Period Weeks   Status On-going     OT SHORT  TERM GOAL #2   Title Patient will be educated on a HEP and independent with implementation of HEP.   Time 3   Period Weeks   Status On-going     OT SHORT TERM GOAL #3   Title Patient will independently apply psychosocial skills and coping mechanisms to her daily activities in order to function independently.   Time 3   Period Weeks   Status On-going                Patient will benefit from skilled therapeutic intervention in order to improve the following deficits and impairments:   (decreased coping skills, decreased psychosocial skills)  Visit Diagnosis: Bipolar disorder, current episode  depressed, severe, without psychotic features (HCC)  PTSD (post-traumatic stress disorder)    Problem List Patient Active Problem List   Diagnosis Date Noted  . Bipolar 2 disorder, major depressive episode (HCC) 03/04/2017    Class: Chronic  . Chronic post-traumatic stress disorder (PTSD) 03/04/2017    Class: Chronic   Ezra Sites, OTR/L  [phone] 03/12/2017, 2:56 PM  [facility] PARTIAL HOSPITALIZATION PROGRAM [address] [place], [place], [address] Phone: [phone]   Fax:  [phone]  Name: Stefanie Galvan MRN: [id number] Date of Birth: June 23, 1965

## 2017-03-12 NOTE — Progress Notes (Signed)
Progress note      Stefanie Galvan is feeling better.  Mood improved and affect positive.  No problems with medication.              Plan:  Continue escitalopram for depression and anxiety.  Continue hydroxyzine 50 mg every 6 hours as needed for anxiety.  Continue lithium 300 mg tid for bipolar mood swings.  Continue trazodone 100 mg hs for sleep

## 2017-03-12 NOTE — Psych (Signed)
   [facility] BH PHP THERAPIST PROGRESS NOTE  Stefanie Galvan [id number]  Session Time: 9 am - 1:30 pm  Participation Level: Active  Behavioral Response: CasualAlertDepressed  Type of Therapy: Individual Therapy; Psychotherapy, Art Therapy, OT  Treatment Goals addressed: Coping  Interventions: CBT, DBT, Supportive and Reframing  Summary:  9:00 - 10:30 Clinician led check-in regarding current stressors and situation, and review of patient completed daily inventory. Clinician utilized active listening and empathetic response and validated patient emotions. Clinician facilitated processing group on pertinent issues.  10:30 - 11:30: OT  11:30 - 12:00: Clinician and pt discussed relationships and how pt's past experiences effect her current concept of relationships and how they should look. Pt discussed past and current family dynamics and past and current romantic relationships.   12:00 - 12:30: Reflection group: Patient encouraged to practice skills and interpersonal techniques or work on mindfulness and relaxation techniques. The importance of self-care and making skills part of a routine to increase usage were stressed.  12:30 - 1:20: Art Therapy: Cln led creating glitter bottles and discussed the ways in which they can be used in mindfulness practice, and to utilize as distress tolerance skills ACCEPTS and Self Soothe. 1:20 - 1:30 Clinician led check-out. Clinician assessed for immediate needs, medication compliance and efficacy, and safety concerns.    Suicidal/Homicidal: Nowithout intent/plan  Therapist Response: [name] is a 51 y.o. female who presents with bipolar and trauma symptoms. Patient arrived within time allowed and reports she is feeling "pretty positive." Patient rates her mood at a 8 on a 1-10 scale with 10 being great. Patient reports " I woke up on the right side of the bed." Pt shares gathering more information about a job opportunity and continuing to work  on her afghan.  Patient engaged in activity and discussion. Pt discussed ways in which the relationship with her mom has changed and effects her current mind set. Pt is able to recognize patterns in past romantic relationships and ways she is seeking to change those patterns. Pt is able to verbalize past hurts and how they effect her still. Patient demonstrated some progress as evidenced by incorporating tenets of trust building into her new relationship. Patient denies SI/HI/self-harm at the end of group.   Plan: Pt will continue in PHP to increase ability to manage emotions and distress while decreasing depression and trauma symptoms.   Diagnosis: Bipolar 2 disorder (HCC) [F31.81]    1. Bipolar 2 disorder (HCC)   2. PTSD (post-traumatic stress disorder)       [name], LCSW 03/12/2017

## 2017-03-13 ENCOUNTER — Other Ambulatory Visit (HOSPITAL_COMMUNITY): Payer: PRIVATE HEALTH INSURANCE | Admitting: Licensed Clinical Social Worker

## 2017-03-13 DIAGNOSIS — F3181 Bipolar II disorder: Secondary | ICD-10-CM | POA: Diagnosis not present

## 2017-03-13 DIAGNOSIS — F4310 Post-traumatic stress disorder, unspecified: Secondary | ICD-10-CM

## 2017-03-13 NOTE — Psych (Signed)
   [facility] BH PHP THERAPIST PROGRESS NOTE  Stefanie Galvan [id number]  Session Time: 9 am - 2:00 pm  Participation Level: Active  Behavioral Response: CasualAlertDepressed  Type of Therapy: Individual Therapy; Psychotherapy, Spiritual Care, Activity Therapy  Treatment Goals addressed: Coping  Interventions: CBT, DBT, Supportive and Reframing  Summary:  9:00 - 10:30 Clinician led check-in regarding current stressors and situation, and review of patient completed daily inventory. Clinician utilized active listening and empathetic response and validated patient emotions. Clinician facilitated processing group on pertinent issues.  10:30 -12:00 Spiritual care group  12:00 - 12:45 Reflection group: Patients encouraged to practice skills and interpersonal techniques or work on mindfulness and relaxation techniques. The importance of self-care and making skills part of a routine to increase usage were stressed.  12:45 - 1:50 Relaxation group: Cln Forde Radon led yoga group focused on retraining the body's response to stress.  1:50 - 2:00 Clinician led check-out. Clinician assessed for immediate needs, medication compliance and efficacy, and safety concerns.   Suicidal/Homicidal: Nowithout intent/plan  Therapist Response: Stefanie Galvan is a 51 y.o. female who presents with bipolar and trauma symptoms. Patient arrived within time allowed and reports she is feeling "tired." Patient rates her mood at a 7 on a 1-10 scale with 10 being great. Patient reports she had a very good afternoon. Patient shared that she did not fight with her mother and was able to finish the blanket she had been working on. Also, patient stated that she played with the glittery bottle that she made in group yesterday and said it made her feel calm when anxious. 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 Patient engaged in activity and discussion. Patient  demonstrates some progress as evidenced by stating ways she has learned to cope when feeling anxious or depressed that she might not have used in the past. Patient denies SI/HI/self-harm at the end of group.    Plan: Pt will continue in PHP to increase ability to manage emotions and distress while decreasing depression and trauma symptoms.   Diagnosis: Bipolar 2 disorder (HCC) [F31.81]    1. Bipolar 2 disorder (HCC)   2. PTSD (post-traumatic stress disorder)     [name], LPCA 03/13/2017

## 2017-03-13 NOTE — Progress Notes (Addendum)
03/13/2017 10:30-12:00.  Pt attended spirituality group facilitated by chaplain Burnis Kingfisher, MDiv   Group utilized the Labyrinth as a tool for reflection, mindfulness, meditation and prayer.   Labyrinth was introduced with some background for the practice.  Participants were given guidance around several ways to approach / use the labyrinth.  Group engaged in labyrinth walk.  Engaged in facilitated reflection and dialog around experience of Labyrinth.  Group facilitation drew on elements of client-centered, narrative, and psycho-dynamic group process.   Selena Batten was present throughout group.  Presented with appropriate / congruent affect and engaged in group activity and discussion voluntarily.  [name] expressed surprise at her experience with the Labyrinth - describing heightened awareness of her emotional state and integration of her faith with her journey with behavioral illness.    Selena Batten stated she recognized on her way into the labyrinth, she had a difficult time "setting things down."   Though she wanted to, she felt some things stuck with her and she became frustrated that she could not set them down.  While standing in the middle, she reflected on the presence of God with her.  Described realizing God is always with her and that she often is not as intentional as she would like to be about recognizing and inviting this presence.  Stated she had a sense of God's care for her even with the things she carries from her life, as well as a sense of God's lament / sadness with her that she has experienced the things that she has (alluding to trauma as she described this).   In discussion, facilitators framed her care for herself in choosing to come to longer term support as offering herself the love that God holds for her.    [name] found the Labyrinth an emotional experience and stated that she could not do it again, as she would "break down."  Discussion raised question of why it was difficult to sit with these  emotions.    [name] expressed that Labyrinth was helpful experience and she hopes to utilize the Labyrinth behind cancer center for centering and setting intentions for the day.  Related this to her practice of sitting in the woods and noticing the nature around her.     [facility] Chaplain Burnis Kingfisher, MDiv

## 2017-03-14 ENCOUNTER — Other Ambulatory Visit (HOSPITAL_COMMUNITY): Payer: PRIVATE HEALTH INSURANCE | Admitting: Specialist

## 2017-03-14 ENCOUNTER — Other Ambulatory Visit (HOSPITAL_COMMUNITY): Payer: PRIVATE HEALTH INSURANCE | Admitting: Licensed Clinical Social Worker

## 2017-03-14 ENCOUNTER — Encounter (HOSPITAL_COMMUNITY): Payer: Self-pay

## 2017-03-14 DIAGNOSIS — F4310 Post-traumatic stress disorder, unspecified: Secondary | ICD-10-CM

## 2017-03-14 DIAGNOSIS — F3181 Bipolar II disorder: Secondary | ICD-10-CM | POA: Diagnosis not present

## 2017-03-14 DIAGNOSIS — F314 Bipolar disorder, current episode depressed, severe, without psychotic features: Secondary | ICD-10-CM

## 2017-03-14 NOTE — Psych (Signed)
   [facility] PHP THERAPIST PROGRESS NOTE  [name] [id number]  Session Time: 9 am - 1:30 pm  Participation Level: Active  Behavioral Response: CasualAlertDepressed  Type of Therapy: Individual Therapy; Psychotherapy,  OT  Treatment Goals addressed: Coping  Interventions: CBT, DBT, Supportive and Reframing  Summary:  9:00 - 10:30 Clinician led check-in regarding current stressors and situation, and review of patient completed daily inventory. Clinician utilized active listening and empathetic response and validated patient emotions. Clinician facilitated processing group on pertinent issues.  10:30 - 11:30: OT  11:30 - 12:00: Clinician and pt discussed the checking the facts skill and how to utilize it. Cln and pt utilized pt led examples and workshopped checking the facts and reframing.  12:00 - 12:30: Reflection group: Patient encouraged to practice skills and interpersonal techniques or work on mindfulness and relaxation techniques. The importance of self-care and making skills part of a routine to increase usage were stressed.  12:30 - 1:20: Cln showed TED talk, "The Power of Vulnerability" and led discussion on ways resistance to being vulnerable has played out in pt's life. 1:20 - 1:30 Clinician led check-out. Clinician assessed for immediate needs, medication compliance and efficacy, and safety concerns.   Suicidal/Homicidal: Nowithout intent/plan  Therapist Response: Stefanie Galvan is a 51 y.o. female who presents with bipolar and trauma symptoms. Patient arrived within time allowed and reports she is feeling "well rested." Patient rates her mood at an 8 on a 1-10 scale with 10 being great. Patient reports she had a good afternoon and night and was able to get 10+ hours of sleep. Patient shared that she spent time with a friend for the afternoon. Patient also stated that she used a mindfulness technique yesterday afternoon to relieve her from some anxiety. Patient  engaged in activity and discussion. Patient demonstrates some progress as evidenced by stating how she was going to continue using her mindfulness techniques to help with her anxiety and depression. Patient denies SI/HI/self-harm thoughts at the end of group.    Plan: Pt will continue in PHP to increase ability to manage emotions and distress while decreasing depression and trauma symptoms.   Diagnosis: Bipolar disorder, current episode depressed, severe, without psychotic features (HCC) [F31.4]    1. Bipolar disorder, current episode depressed, severe, without psychotic features (HCC)   2. PTSD (post-traumatic stress disorder)       [name], LCSW 03/14/2017

## 2017-03-14 NOTE — Therapy (Signed)
[facility] PARTIAL HOSPITALIZATION PROGRAM [address] [place], [place], [address] Phone: [phone]   Fax:  [phone]  Occupational Therapy Treatment  Patient Details  Name: NYIESHA BEEVER MRN: [id number] Date of Birth: [birth year] Referring Provider: Dr. Carolanne Grumbling  Encounter Date: 03/14/2017      OT End of Session - 03/14/17 1327    Visit Number 4   Number of Visits 6   Date for OT Re-Evaluation 03/29/17   Authorization Type [employer]   OT Start Time 1030   OT Stop Time 1130   OT Time Calculation (min) 60 min   Activity Tolerance Patient tolerated treatment well   Behavior During Therapy [facility] for tasks assessed/performed      Past Medical History:  Diagnosis Date  . Arthritis   . Depression 2.5 yrs ago   situational dep-no current tx/meds    Past Surgical History:  Procedure Laterality Date  . BREAST REDUCTION SURGERY  02/25/2012   Procedure: MAMMARY REDUCTION  (BREAST);  Surgeon: Louisa Second, MD;  Location: [facility];  Service: Plastics;  Laterality: Bilateral;  bilateral reduction  . DILATION AND CURETTAGE OF UTERUS  2001  . TEMPOROMANDIBULAR JOINT SURGERY  1984; 1996   X 2; bilateral  . TUBAL LIGATION  2003    There were no vitals filed for this visit.      Subjective Assessment - 03/14/17 1327    Currently in Pain? No/denies        S:  I can't go back to my current employer, its too stressful.  O:  Group began with members brainstorming on activities they enjoyed doing, and how these could be incorporated into a work setting.  If you enjoy your work, you will be less stressed at work.  Selena Batten identified that she enjoys animals, knitting, and flowers.  Selena Batten was able to identify 9 possible areas of work or employers that would incorporate these passions at work.  Group was educated and discussed common sources of work stress, and how to manage or minimize these stressors.  Selena Batten stated that she has begun  utilizing mindfulness for stress reduction at home.  Therapist coached her through how to apply mindfulness in the workplace for stress reduction.  Group discussed soft skills and their importance to success in the workplace, as well as interview tips.   A:  Selena Batten was able to identify several work stressors she currently encounters, and was open to trying new techniques to decrease her stress level at work.  Selena Batten acknowledged that working with animals or crafting supplies would be a less stressful work environment for her. P:  Continue skilled OT group 2 times per week for 3 weeks Group focus will be on healthy sleep hygiene.   Shirlean Mylar, MHA, OTR/L [phone]                        OT Education - 03/14/17 1327    Education provided Yes   Education Details Educated patient on soft skills and their importance in the workplace, interview strategies, common work stressors and ways to manage stress at work.    Person(s) Educated Patient   Methods Explanation;Handout   Comprehension Verbalized understanding          OT Short Term Goals - 03/07/17 1459      OT SHORT TERM GOAL #1   Title Patient will be educated on strategies to improve psychosocial skills needed to participate fully in  all daily, work, and leisure activities.   Time 3   Period Weeks   Status On-going     OT SHORT TERM GOAL #2   Title Patient will be educated on a HEP and independent with implementation of HEP.   Time 3   Period Weeks   Status On-going     OT SHORT TERM GOAL #3   Title Patient will independently apply psychosocial skills and coping mechanisms to her daily activities in order to function independently.   Time 3   Period Weeks   Status On-going                  Plan - 03/14/17 1327    Consulted and Agree with Plan of Care Patient      Patient will benefit from skilled therapeutic intervention in order to improve the following deficits and impairments:    (decreased coping skills, decreased psychosocial skills)  Visit Diagnosis: Bipolar 2 disorder (HCC)  PTSD (post-traumatic stress disorder)    Problem List Patient Active Problem List   Diagnosis Date Noted  . Bipolar 2 disorder, major depressive episode (HCC) 03/04/2017    Class: Chronic  . Chronic post-traumatic stress disorder (PTSD) 03/04/2017    Class: Chronic    Jacqualine Code 03/14/2017, 1:28 PM  [facility] PARTIAL HOSPITALIZATION PROGRAM [address] [place], [place], [address] Phone: [phone]   Fax:  [phone]  Name: ASAMI LAMBRIGHT MRN: [id number] Date of Birth: 1966-03-29

## 2017-03-15 ENCOUNTER — Encounter (HOSPITAL_COMMUNITY): Payer: Self-pay

## 2017-03-15 ENCOUNTER — Other Ambulatory Visit (HOSPITAL_COMMUNITY): Payer: PRIVATE HEALTH INSURANCE | Admitting: Licensed Clinical Social Worker

## 2017-03-15 VITALS — BP 120/72 | HR 74 | Ht 68.0 in | Wt 214.0 lb

## 2017-03-15 DIAGNOSIS — F4310 Post-traumatic stress disorder, unspecified: Secondary | ICD-10-CM

## 2017-03-15 DIAGNOSIS — F314 Bipolar disorder, current episode depressed, severe, without psychotic features: Secondary | ICD-10-CM

## 2017-03-15 DIAGNOSIS — F3181 Bipolar II disorder: Secondary | ICD-10-CM | POA: Diagnosis not present

## 2017-03-15 NOTE — Progress Notes (Signed)
Met with patient this date as she presented with appropriate affect, more level and pleasant mood and rated her current depression a 1, anxiety a 3 and hopelessness a 0 on a scale of 0-10 with 0 being none and 10 the worst she could experience.  Patient denied any current suicidal or homicidal ideations and no other symptoms at this time.  Patient denied any problems with appetite or sleep, now sleeping 9-11 hours a night at times and patient scored a 5 on her PHQ9 depression screening, down from 7 scored 03/01/17.  Patient reported she was suspicious about the behaviors of a relationship she was talking to and had now decided this relationship was not good for her so was not going to move any further with it.  Patient stated she was not hurt by this and knew she was making the right decision for herself.  Patient stated things still going okay with her Mother, no recent outbursts or arguments and that she is still applying for and looking into possible new job opportunities.  Patient stated she has learned a lot in [facility] and denies any current problems with medications or any symptoms at this time.  Discussed patient's status with plans to find an individual therapist once completes PHP and patient stated this is what she would like to do for her transition.  Patient stable this date but wants to continue with PHP for one more week before making any transitions.

## 2017-03-15 NOTE — Psych (Signed)
   [facility] BH PHP THERAPIST PROGRESS NOTE  Stefanie Galvan [id number]  Session Time: 9 am - 1 pm  Participation Level: Active  Behavioral Response: CasualAlertAnxious and Euthymic  Type of Therapy: Individual Therapy; Psychotherapy, Art therapy  Treatment Goals addressed: Coping  Interventions: CBT, DBT, Supportive and Reframing  Summary:  9:00 - 10:30 Clinician led check-in regarding current stressors and situation, and review of patient completed daily inventory. Clinician utilized active listening and empathetic response and validated patient emotions. Clinician facilitated processing group on pertinent issues.  10:30 -12:00: Clinician introduced topic of "Cognitive Distortions". Patients identified cognitive distortions they often have and ways to combat these distortions.  12:00-12:50: Clinician led art therapy group on self-care. Patients created self-care collages.  12:50 - 1:00 Clinician led check-out. Clinician assessed for immediate needs, medication compliance and efficacy, and safety concerns.    Suicidal/Homicidal: Nowithout intent/plan  Therapist Response: [name] is a 51 y.o. female who presents with bipolar and trauma symptoms. Patient arrived within time allowed and reports she is feeling "pretty good." Patient rates her mood at an 7 on a 1-10 scale with 10 being great. Patient reports she slept 11+ hours last night and is feeling well rested. Pt reports she feels she is catching up on sleep, as it was poor at the beginning of the week. Pt shares issues she was having with a date. Pt was validated for following appropriate trust foundations and setting good boundaries. Patient engaged in activity and discussion. Patient shares cognitive distortions of all or nothing thinking, personalization, and over-generalization are ones she particularly struggles with. Patient demonstrates some progress as evidenced by recognizing areas in which she is vulnerable. Patient  denies SI/HI/self-harm thoughts at the end of group.    Plan: Pt will continue in PHP to increase ability to manage emotions and distress while decreasing depression and trauma symptoms.   Diagnosis: Bipolar disorder, current episode depressed, severe, without psychotic features (HCC) [F31.4]    1. Bipolar disorder, current episode depressed, severe, without psychotic features (HCC)   2. PTSD (post-traumatic stress disorder)       [name], LCSW 03/15/2017

## 2017-03-18 ENCOUNTER — Other Ambulatory Visit (HOSPITAL_COMMUNITY): Payer: PRIVATE HEALTH INSURANCE | Attending: Psychiatry | Admitting: Licensed Clinical Social Worker

## 2017-03-18 DIAGNOSIS — F4312 Post-traumatic stress disorder, chronic: Secondary | ICD-10-CM | POA: Insufficient documentation

## 2017-03-18 DIAGNOSIS — F329 Major depressive disorder, single episode, unspecified: Secondary | ICD-10-CM | POA: Diagnosis present

## 2017-03-18 DIAGNOSIS — F314 Bipolar disorder, current episode depressed, severe, without psychotic features: Secondary | ICD-10-CM

## 2017-03-18 DIAGNOSIS — F4310 Post-traumatic stress disorder, unspecified: Secondary | ICD-10-CM

## 2017-03-18 NOTE — Psych (Signed)
   [facility] BH PHP THERAPIST PROGRESS NOTE  Stefanie Galvan [id number]  Session Time: 9 am - 2 pm  Participation Level: Active  Behavioral Response: CasualAlertAnxious  Type of Therapy: Group Therapy; Psychotherapy,  Psychoeducation, Medication Group  Treatment Goals addressed: Coping  Interventions: CBT, DBT, Supportive and Reframing  Summary:  9:00 - 11:00: Pharmacist discussed medication with group and answered questions  11:00 -12:00: Clinician led check-in regarding current stressors and situation, and review of patient completed daily inventory. Clinician utilized active listening and empathetic response and validated patient emotions. Clinician facilitated processing group on pertinent issues.  12:00 - 12:45: Reflection group: Patients encouraged to practice skills and interpersonal techniques or work on mindfulness and relaxation techniques. The importance of self-care and making skills part of a routine to increase usage when stressed.  12:45 - 1:50: Clinician introduced topic of "Positive Psychology". Group watched "Positive Psychology" Ted-Talk. Patients discussed how their "lens" of life effects the way they feel. Patients identified two strategies they would be willing to try to change their "lens".  1:50 - 2:00: Clinician led check-out. Clinician assessed for immediate needs, medication compliance and efficacy, and safety concerns.    Suicidal/Homicidal: Nowithout intent/plan  Therapist Response: [name] is a 51 y.o. female who presents with bipolar and trauma symptoms. Patient arrived within time allowed and reports she is feeling "irritable." Patient rates her mood at an 6.5 on a 1-10 scale with 10 being great. Patient reports she had a decent weekend however was upset by an incident with her mom in which pt felt her privacy was violated. Patient  Became anxious during medication group and was able to self manage her symptoms. Patient engaged in activity and  discussion. Patient demonstrates some progress as evidenced by sharing continued use of self soothe. Patient denies SI/HI/self-harm thoughts at the end of group.    Plan: Pt will continue in PHP to increase ability to manage emotions and distress while decreasing depression and trauma symptoms.   Diagnosis: Bipolar disorder, current episode depressed, severe, without psychotic features (HCC) [F31.4]    1. Bipolar disorder, current episode depressed, severe, without psychotic features (HCC)   2. PTSD (post-traumatic stress disorder)       [name], LCSW 03/18/2017

## 2017-03-19 ENCOUNTER — Other Ambulatory Visit (HOSPITAL_COMMUNITY): Payer: PRIVATE HEALTH INSURANCE

## 2017-03-19 ENCOUNTER — Encounter (HOSPITAL_COMMUNITY): Payer: Self-pay

## 2017-03-19 ENCOUNTER — Other Ambulatory Visit (HOSPITAL_COMMUNITY): Payer: PRIVATE HEALTH INSURANCE | Admitting: Licensed Clinical Social Worker

## 2017-03-19 ENCOUNTER — Inpatient Hospital Stay (HOSPITAL_COMMUNITY)
Admission: RE | Admit: 2017-03-19 | Discharge: 2017-03-22 | DRG: 885 | Disposition: A | Discharge status: Home / Self Care | Payer: PRIVATE HEALTH INSURANCE | Attending: Psychiatry | Admitting: Psychiatry

## 2017-03-19 DIAGNOSIS — Z915 Personal history of self-harm: Secondary | ICD-10-CM

## 2017-03-19 DIAGNOSIS — Z888 Allergy status to other drugs, medicaments and biological substances status: Secondary | ICD-10-CM

## 2017-03-19 DIAGNOSIS — F332 Major depressive disorder, recurrent severe without psychotic features: Secondary | ICD-10-CM

## 2017-03-19 DIAGNOSIS — F4310 Post-traumatic stress disorder, unspecified: Secondary | ICD-10-CM | POA: Diagnosis present

## 2017-03-19 DIAGNOSIS — M1990 Unspecified osteoarthritis, unspecified site: Secondary | ICD-10-CM | POA: Diagnosis present

## 2017-03-19 DIAGNOSIS — F4312 Post-traumatic stress disorder, chronic: Secondary | ICD-10-CM | POA: Diagnosis not present

## 2017-03-19 DIAGNOSIS — F17210 Nicotine dependence, cigarettes, uncomplicated: Secondary | ICD-10-CM | POA: Diagnosis not present

## 2017-03-19 DIAGNOSIS — Z87820 Personal history of traumatic brain injury: Secondary | ICD-10-CM

## 2017-03-19 DIAGNOSIS — F3132 Bipolar disorder, current episode depressed, moderate: Secondary | ICD-10-CM | POA: Diagnosis present

## 2017-03-19 DIAGNOSIS — Z62810 Personal history of physical and sexual abuse in childhood: Secondary | ICD-10-CM | POA: Diagnosis present

## 2017-03-19 DIAGNOSIS — F319 Bipolar disorder, unspecified: Secondary | ICD-10-CM | POA: Diagnosis present

## 2017-03-19 DIAGNOSIS — F419 Anxiety disorder, unspecified: Secondary | ICD-10-CM | POA: Diagnosis not present

## 2017-03-19 DIAGNOSIS — F329 Major depressive disorder, single episode, unspecified: Secondary | ICD-10-CM | POA: Diagnosis present

## 2017-03-19 DIAGNOSIS — R45851 Suicidal ideations: Secondary | ICD-10-CM | POA: Diagnosis present

## 2017-03-19 DIAGNOSIS — F39 Unspecified mood [affective] disorder: Secondary | ICD-10-CM | POA: Diagnosis not present

## 2017-03-19 HISTORY — DX: Unspecified intracranial injury with loss of consciousness of unspecified duration, initial encounter: S06.9X9A

## 2017-03-19 LAB — URINALYSIS, ROUTINE W REFLEX MICROSCOPIC
BACTERIA UA: NONE SEEN
Bilirubin Urine: NEGATIVE
GLUCOSE, UA: NEGATIVE mg/dL
HGB URINE DIPSTICK: NEGATIVE
Ketones, ur: NEGATIVE mg/dL
NITRITE: NEGATIVE
PH: 8 (ref 5.0–8.0)
Protein, ur: 30 mg/dL — AB
SPECIFIC GRAVITY, URINE: 1.021 (ref 1.005–1.030)

## 2017-03-19 LAB — LITHIUM LEVEL: LITHIUM LVL: 1 mmol/L (ref 0.60–1.20)

## 2017-03-19 LAB — COMPREHENSIVE METABOLIC PANEL
ALBUMIN: 4.7 g/dL (ref 3.5–5.0)
ALT: 19 U/L (ref 14–54)
ANION GAP: 8 (ref 5–15)
AST: 19 U/L (ref 15–41)
Alkaline Phosphatase: 72 U/L (ref 38–126)
BILIRUBIN TOTAL: 0.6 mg/dL (ref 0.3–1.2)
BUN: 13 mg/dL (ref 6–20)
CALCIUM: 9.4 mg/dL (ref 8.9–10.3)
CO2: 26 mmol/L (ref 22–32)
CREATININE: 1.03 mg/dL — AB (ref 0.44–1.00)
Chloride: 104 mmol/L (ref 101–111)
GFR calc non Af Amer: >60 mL/min (ref >60)
Glucose, Bld: 126 mg/dL — ABNORMAL HIGH (ref 65–99)
POTASSIUM: 3.9 mmol/L (ref 3.5–5.1)
SODIUM: 138 mmol/L (ref 135–145)
TOTAL PROTEIN: 8.1 g/dL (ref 6.5–8.1)

## 2017-03-19 LAB — RAPID URINE DRUG SCREEN, HOSP PERFORMED
Amphetamines: NOT DETECTED
BARBITURATES: NOT DETECTED
Benzodiazepines: NOT DETECTED
Cocaine: NOT DETECTED
Opiates: NOT DETECTED
TETRAHYDROCANNABINOL: NOT DETECTED

## 2017-03-19 LAB — CBC
HEMATOCRIT: 36.9 % (ref 36.0–46.0)
Hemoglobin: 12.8 g/dL (ref 12.0–15.0)
MCH: 28.5 pg (ref 26.0–34.0)
MCHC: 34.7 g/dL (ref 30.0–36.0)
MCV: 82.2 fL (ref 78.0–100.0)
Platelets: 237 10*3/uL (ref 150–400)
RBC: 4.49 MIL/uL (ref 3.87–5.11)
RDW: 13.1 % (ref 11.5–15.5)
WBC: 6.4 10*3/uL (ref 4.0–10.5)

## 2017-03-19 LAB — HEMOGLOBIN A1C
HEMOGLOBIN A1C: 4.9 % (ref 4.8–5.6)
MEAN PLASMA GLUCOSE: 93.93 mg/dL

## 2017-03-19 LAB — TSH: TSH: 3.823 u[IU]/mL (ref 0.350–4.500)

## 2017-03-19 MED ORDER — ACETAMINOPHEN 325 MG PO TABS
650.0000 mg | ORAL_TABLET | Freq: Four times a day (QID) | ORAL | Status: DC | PRN
  Administered 2017-03-21: 650 mg via ORAL
  Filled 2017-03-19: qty 2

## 2017-03-19 MED ORDER — ESCITALOPRAM OXALATE 10 MG PO TABS
10.0000 mg | ORAL_TABLET | Freq: Every day | ORAL | Status: DC
  Filled 2017-03-19 (×2): qty 1

## 2017-03-19 MED ORDER — LITHIUM CARBONATE 300 MG PO CAPS
300.0000 mg | ORAL_CAPSULE | Freq: Three times a day (TID) | ORAL | Status: DC
  Administered 2017-03-20 – 2017-03-22 (×8): 300 mg via ORAL
  Filled 2017-03-19 (×13): qty 1

## 2017-03-19 MED ORDER — LITHIUM CARBONATE 300 MG PO CAPS
300.0000 mg | ORAL_CAPSULE | Freq: Once | ORAL | Status: AC
  Administered 2017-03-19: 300 mg via ORAL
  Filled 2017-03-19 (×2): qty 1

## 2017-03-19 MED ORDER — ESCITALOPRAM OXALATE 10 MG PO TABS
10.0000 mg | ORAL_TABLET | Freq: Every day | ORAL | Status: DC
  Administered 2017-03-19 – 2017-03-21 (×3): 10 mg via ORAL
  Filled 2017-03-19 (×6): qty 1

## 2017-03-19 MED ORDER — TRAZODONE HCL 100 MG PO TABS
100.0000 mg | ORAL_TABLET | Freq: Every day | ORAL | Status: DC
  Administered 2017-03-19 – 2017-03-21 (×3): 100 mg via ORAL
  Filled 2017-03-19 (×6): qty 1

## 2017-03-19 MED ORDER — LITHIUM CARBONATE 300 MG PO CAPS
300.0000 mg | ORAL_CAPSULE | Freq: Two times a day (BID) | ORAL | Status: DC
  Administered 2017-03-19: 300 mg via ORAL
  Filled 2017-03-19 (×3): qty 1

## 2017-03-19 MED ORDER — ALUM & MAG HYDROXIDE-SIMETH 200-200-20 MG/5ML PO SUSP: 30.0000 mL | ORAL | Status: DC | PRN

## 2017-03-19 MED ORDER — HYDROXYZINE HCL 25 MG PO TABS
25.0000 mg | ORAL_TABLET | Freq: Three times a day (TID) | ORAL | Status: DC | PRN
  Administered 2017-03-19 – 2017-03-21 (×3): 25 mg via ORAL
  Filled 2017-03-19 (×3): qty 1

## 2017-03-19 MED ORDER — MAGNESIUM HYDROXIDE 400 MG/5ML PO SUSP: 30.0000 mL | Freq: Every day | ORAL | Status: DC | PRN

## 2017-03-19 MED ORDER — ESCITALOPRAM OXALATE 10 MG PO TABS: 10.0000 mg | ORAL_TABLET | Freq: Every day | ORAL | Status: DC

## 2017-03-19 NOTE — Progress Notes (Signed)
Progress note     Stefanie Galvan is depressed and hopeless with suicidal thoughts and a plan to overdose with pills if she goes home.  She was just in the hospital at [facility] before being in the partial program and does not want to be in the hospital but nevertheless is afraid to be on her own without a good support system at home      Plan:  Refer for evaluation for inpatient admission

## 2017-03-19 NOTE — Progress Notes (Signed)
Individual session as component of PHP  Cln met with pt individually when OT clinician shared pt was experiencing SI. Pt reports that while she reported in check in that she had suicidal thoughts last night and had managed them, that she was still having active thoughts and they were getting worse. Pt reports she did attempt to manage them last night and it worked while actively engaged in the coping skill but did not last. Pt was tearful throughout the session and states increasing hopelessness. Pt reports no one will miss her, that her children are grown and won't need her, that she is a disappointment to everyone. Pt is not responsive to reframing and checking the facts. Pt is unclear about safety. She states that "next time I attempt I will succeed" and that she has a plan to overdose, however when asked if she needs to go to the hospital, she states she is unsure.  Pt is amenable to meeting with the psychiatrist and to return to group until able to meet with him.  [name] MSW, LCSW, LCAS

## 2017-03-19 NOTE — Tx Team (Signed)
Initial Treatment Plan 03/19/2017 5:59 PM Stefanie Galvan ZOX:[id number]    PATIENT STRESSORS: Marital or family conflict Occupational concerns   PATIENT STRENGTHS: Ability for insight Average or above average intelligence Capable of independent living Metallurgist fund of knowledge Motivation for treatment/growth Physical Health Religious Affiliation Supportive family/friends Work skills   PATIENT IDENTIFIED PROBLEMS: "How to cope better when I am depressed, how to control those feelings"  "How to cope with SI without ending up in the hospital."                   DISCHARGE CRITERIA:  Ability to meet basic life and health needs Improved stabilization in mood, thinking, and/or behavior Need for constant or close observation no longer present Reduction of life-threatening or endangering symptoms to within safe limits  PRELIMINARY DISCHARGE PLAN: Attend aftercare/continuing care group Attend PHP/IOP  PATIENT/FAMILY INVOLVEMENT: This treatment plan has been presented to and reviewed with the patient, Stefanie Galvan.  The patient and family have been given the opportunity to ask questions and make suggestions.  Lindajo Royal, RN 03/19/2017, 5:59 PM
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
## 2017-03-19 NOTE — H&P (Signed)
Behavioral Health Medical Screening Exam  Stefanie Galvan is an 51 y.o. female who arrived voluntarily to [facility] by the recommendation of her partial program counselor with c/o suicide ideations with plan to overdose. Patient appears depressed and tearful and she does not want to continue to feel this way. She stated that she want to end it all. Patient unable to contract for safety.  Total Time spent with patient: 30 minutes  Psychiatric Specialty Exam: Physical Exam  Vitals reviewed. Constitutional: She is oriented to person, place, and time. She appears well-developed and well-nourished.  HENT:  Head: Normocephalic and atraumatic.  Eyes: Pupils are equal, round, and reactive to light.  Neck: Normal range of motion. Neck supple.  Cardiovascular: Normal rate, regular rhythm and normal heart sounds.   Respiratory: Effort normal and breath sounds normal.  GI: Soft. Bowel sounds are normal.  Genitourinary:  Genitourinary Comments: Deferred  Musculoskeletal: Normal range of motion.  Neurological: She is alert and oriented to person, place, and time.  Skin: Skin is warm and dry.    Review of Systems  Psychiatric/Behavioral: Positive for depression and suicidal ideas.  All other systems reviewed and are negative.   Blood pressure 97/63, pulse (!) 59, temperature 98.6 F (37 C), temperature source Oral, SpO2 97 %.There is no height or weight on file to calculate BMI.  General Appearance: Casual and Well Groomed  Eye Contact:  Good  Speech:  Clear and Coherent and Normal Rate  Volume:  Normal  Mood:  Anxious and Depressed  Affect:  Congruent and Depressed  Thought Process:  Coherent and Goal Directed  Orientation:  Full (Time, Place, and Person)  Thought Content:  WDL and Logical  Suicidal Thoughts:  Yes.  with intent/plan  Homicidal Thoughts:  No  Memory:  Immediate;   Good Recent;   Good Remote;   Good  Judgement:  Good  Insight:  Present  Psychomotor Activity:  Normal   Concentration: Concentration: Good and Attention Span: Good  Recall:  Good  Fund of Knowledge:Good  Language: Good  Akathisia:  Negative  Handed:  Right  AIMS (if indicated):     Assets:  Communication Skills Desire for Improvement Financial Resources/Insurance Housing Leisure Time Physical Health Resilience Social Support  Sleep:       Musculoskeletal: Strength & Muscle Tone: within normal limits Gait & Station: normal Patient leans: N/A  Blood pressure 97/63, pulse (!) 59, temperature 98.6 F (37 C), temperature source Oral, SpO2 97 %.  Recommendations:  Based on my evaluation the patient appears to have an emergency condition for which I recommend the patient be admitted inpatient for medication management and stabilization.  Delila Pereyra, NP 03/19/2017, 4:01 PM

## 2017-03-19 NOTE — Psych (Signed)
   [facility] PHP THERAPIST PROGRESS NOTE  [name] [id number]  Session Time: 9 am - 1:20 pm  Participation Level: Active  Behavioral Response: CasualAlertAnxious and Depressed  Type of Therapy: Group Therapy; Psychotherapy,  Psychoeducation, OT  Treatment Goals addressed: Coping  Interventions: CBT, DBT, Supportive and Reframing  Summary:  9:00 - 10:30 Clinician led check-in regarding current stressors and situation, and review of patient completed daily inventory. Clinician utilized active listening and empathetic response and validated patient emotions. Clinician facilitated processing group on pertinent issues.  10:30 -11:30: OT Group  11:30 - 12:00 Clinician led discussion on support systems and how their reactions to mental health effct [place].  12:00 - 12:45 Reflection group: Patients encouraged to practice skills and interpersonal techniques or work on mindfulness and relaxation techniques. The importance of self-care and making skills part of a routine to increase usage were stressed.  12:45 - 1:50 Cln provided psychoeducation on the 4 communication styles. Group identified their personal style and worked on recognizing the presentation of each communication style.  1:50 - 2:00 Clinician led check-out. Clinician assessed for immediate needs, medication compliance and efficacy, and safety concerns.    Suicidal/Homicidal: Cristopher [place] intent/plan  Therapist Response: Stefanie Galvan is a 51 y.o. female who presents with bipolar and trauma symptoms. Patient arrived within time allowed and reports she is feeling "just kind of blah." Patient rates her mood at an 7 on a 1-10 scale with 10 being great. Patient originally reports she experienced SI last night and was able to manage the thoughts by checking the facts and self soothing. Pt later informed cln she had not stopped having SI since the night before and was feeling increasingly hopeless. Pt reports she spoke to her boss  yesterday and is considering taking a transfer should they offer it. Patient engaged in activity and discussion however is less active than normal. Patient demonstrates lack of progress as evidenced by uncontrolled SI with plan. Pt met with group psychiatrist who assessed for SI.  Per his recommendation, patient will present to [facility] as a walk-in to be assessed for inpatient treatment.   Plan: Pt will pursue inpatient treatment to maintain safety and return to PHP or IOP upon discharge.    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 Severe recurrent major depression without psychotic features [facility])       [name], LCSW 03/19/2017
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
## 2017-03-20 ENCOUNTER — Other Ambulatory Visit (HOSPITAL_COMMUNITY): Payer: Self-pay

## 2017-03-20 DIAGNOSIS — F319 Bipolar disorder, unspecified: Principal | ICD-10-CM

## 2017-03-20 LAB — LIPID PANEL
CHOL/HDL RATIO: 5.1 ratio
CHOLESTEROL: 259 mg/dL — AB (ref 0–200)
HDL: 51 mg/dL (ref >40)
LDL Cholesterol: 179 mg/dL — ABNORMAL HIGH (ref 0–99)
Triglycerides: 145 mg/dL (ref <150)
VLDL: 29 mg/dL (ref 0–40)

## 2017-03-20 NOTE — Progress Notes (Signed)
Patient ID: Stefanie Galvan, female   DOB: Mar 04, 1966, 51 y.o.   MRN: [id number]  D: Patient observed watching TV and interacting well with peers on approach. Pt reports she had a good day using coping skills. Pt mood and affect appeared depressed and flat  Pt attended evening wrap up group and Interacted appropriately with peers. Denies  SI/HI/AVH and pain.No behavioral issues noted.  A: Support and encouragement offered as needed. Medications administered as prescribed.  R: Patient is safe and cooperative on unit. Will continue to monitor for safety and stability.

## 2017-03-20 NOTE — [facility] Suicide Risk Assessment (Signed)
[facility] INPATIENT:  Family/Significant Other Suicide Prevention Education  Suicide Prevention Education:  Education Completed; Stefanie Galvan (pt's mother) [phone] has been identified by the patient as the family member/significant other with whom the patient will be residing, and identified as the person(s) who will aid the patient in the event of a mental health crisis (suicidal ideations/suicide attempt).  With written consent from the patient, the family member/significant other has been provided the following suicide prevention education, prior to the and/or following the discharge of the patient.  The suicide prevention education provided includes the following:  Suicide risk factors  Suicide prevention and interventions  National Suicide Hotline telephone number  [facility] assessment telephone number  [facility] Emergency Assistance 911  [facility] and/or Residential Mobile Crisis Unit telephone number  Request made of family/significant other to:  Remove weapons (e.g., guns, rifles, knives), all items previously/currently identified as safety concern.    Remove drugs/medications (over-the-counter, prescriptions, illicit drugs), all items previously/currently identified as a safety concern.  The family member/significant other verbalizes understanding of the suicide prevention education information provided.  The family member/significant other agrees to remove the items of safety concern listed above.  Tyleah Loh N Smart LCSW 03/20/2017, 3:33 PM

## 2017-03-20 NOTE — Progress Notes (Signed)
D: Pt presents with a flat affect and a depressed mood.  Pt stated that she feels okay today but is concerned about the changes in her mood. Pt verbalized that she's been feeling depression and suicidal and is unsure if it's due to her age, starting Lithium or hx of TBI. Pt denies SI today and verbally contracts for safety. Pt stated that she didn't want to be hospitalized and that she wanted to attend out pt tx . Pt stated that she believes that she may need a med adjustment due to the changes in her mood. Pt signed 72 hour request for d/c today noted for 8:20 am. A: Medications reviewed with pt. Medications administered as ordered per MD. Verbal support provided. Pt encouraged to attend groups. 15 minute checks performed for safety.  R: Pt compliant with tx.
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
## 2017-03-20 NOTE — Progress Notes (Signed)
Pt attended wrap-up group and participated in a group activity.  

## 2017-03-20 NOTE — [facility] Group Notes (Signed)
[facility] Mental [facility] Association Group Therapy 03/20/2017 1:15pm  Type of Therapy: Mental Health Association Presentation  Participation Level: Active  Participation Quality: Attentive  Affect: Appropriate  Cognitive: Oriented  Insight: Developing/Improving  Engagement in Therapy: Engaged  Modes of Intervention: Discussion, Education and Socialization  Summary of Progress/Problems: Mental Health Association (MHA) Speaker came to talk about his personal journey with living with a mental health diagnosis. The pt processed ways by which to relate to the speaker. MHA speaker provided handouts and educational information pertaining to groups and services offered by the MHA. Pt was engaged in speaker's presentation and was receptive to resources provided.    Sueann Brownley B Hamzeh Tall, MSW, LCSWA 03/20/2017 5:09 PM   

## 2017-03-20 NOTE — Tx Team (Signed)
Interdisciplinary Treatment and Diagnostic Plan Update  03/20/2017 Time of Session: 0830AM Stefanie Galvan MRN: [id number]  Principal Diagnosis: Bipolar Disorder I  Secondary Diagnoses: Active Problems:   Bipolar 1 disorder, depressed (HCC)   Current Medications:  Current Facility-Administered Medications  Medication Dose Route Frequency Provider Last Rate Last Dose  . acetaminophen (TYLENOL) tablet 650 mg  650 mg Oral Q6H PRN Okonkwo, Justina A, NP      . alum & mag hydroxide-simeth (MAALOX/MYLANTA) 200-200-20 MG/5ML suspension 30 mL  30 mL Oral Q4H PRN Okonkwo, Justina A, NP      . escitalopram (LEXAPRO) tablet 10 mg  10 mg Oral QHS Nelly Rout, MD   10 mg at 03/19/17 2123  . hydrOXYzine (ATARAX/VISTARIL) tablet 25 mg  25 mg Oral TID PRN Ferne Reus A, NP   25 mg at 03/19/17 2123  . lithium carbonate capsule 300 mg  300 mg Oral TID Beryle Lathe, Justina A, NP   300 mg at 03/20/17 0801  . magnesium hydroxide (MILK OF MAGNESIA) suspension 30 mL  30 mL Oral Daily PRN Beryle Lathe, Justina A, NP      . traZODone (DESYREL) tablet 100 mg  100 mg Oral QHS Okonkwo, Justina A, NP   100 mg at 03/19/17 2122   PTA Medications: Prescriptions Prior to Admission  Medication Sig Dispense Refill Last Dose  . escitalopram (LEXAPRO) 10 MG tablet Take 10 mg by mouth at bedtime.   03/19/2017  . hydrOXYzine (VISTARIL) 50 MG capsule Take 50 mg by mouth at bedtime.   03/19/2017  . lithium carbonate 300 MG capsule Take 300 mg by mouth 3 (three) times daily with meals.   03/20/2017  . traZODone (DESYREL) 100 MG tablet Take 100 mg by mouth at bedtime.    03/19/2017    Patient Stressors: Marital or family conflict Occupational concerns  Patient Strengths: Ability for insight Average or above average intelligence Capable of independent living Metallurgist fund of knowledge Motivation for treatment/growth Physical Health Religious Affiliation Supportive  family/friends Work skills  Treatment Modalities: Medication Management, Group therapy, Case management,  1 to 1 session with clinician, Psychoeducation, Recreational therapy.   Physician Treatment Plan for Primary Diagnosis: Bipolar Disorder I  Medication Management: Evaluate patient's response, side effects, and tolerance of medication regimen.  Therapeutic Interventions: 1 to 1 sessions, Unit Group sessions and Medication administration.  Evaluation of Outcomes: Progressing  Physician Treatment Plan for Secondary Diagnosis: Active Problems:   Bipolar 1 disorder, depressed (HCC)  Long Term Goal(s):     Short Term Goals:       Medication Management: Evaluate patient's response, side effects, and tolerance of medication regimen.  Therapeutic Interventions: 1 to 1 sessions, Unit Group sessions and Medication administration.  Evaluation of Outcomes: Progressing   RN Treatment Plan for Primary Diagnosis:Bipolar Disorder I Long Term Goal(s): Knowledge of disease and therapeutic regimen to maintain health will improve  Short Term Goals: Ability to remain free from injury will improve, Ability to verbalize feelings will improve and Ability to disclose and discuss suicidal ideas  Medication Management: RN will administer medications as ordered by provider, will assess and evaluate patient's response and provide education to patient for prescribed medication. RN will report any adverse and/or side effects to prescribing provider.  Therapeutic Interventions: 1 on 1 counseling sessions, Psychoeducation, Medication administration, Evaluate responses to treatment, Monitor vital signs and CBGs as ordered, Perform/monitor CIWA, COWS, AIMS and Fall Risk screenings as ordered, Perform wound care treatments as ordered.  Evaluation  of Outcomes: Progressing   LCSW Treatment Plan for Primary Diagnosis: Bipolar Disorder I Long Term Goal(s): Safe transition to appropriate next level of care at  discharge, Engage patient in therapeutic group addressing interpersonal concerns.  Short Term Goals: Engage patient in aftercare planning with referrals and resources, Facilitate acceptance of mental health diagnosis and concerns and Facilitate patient progression through stages of change regarding substance use diagnoses and concerns  Therapeutic Interventions: Assess for all discharge needs, 1 to 1 time with Social worker, Explore available resources and support systems, Assess for adequacy in community support network, Educate family and significant other(s) on suicide prevention, Complete Psychosocial Assessment, Interpersonal group therapy.  Evaluation of Outcomes: Progressing   Progress in Treatment: Attending groups: Yes. Participating in groups: Yes. Taking medication as prescribed: Yes. Toleration medication: Yes. Family/Significant other contact made: No, will contact:  family member if patient consents Patient understands diagnosis: Yes. Discussing patient identified problems/goals with staff: Yes. Medical problems stabilized or resolved: Yes. Denies suicidal/homicidal ideation: Yes per self report.  Issues/concerns per patient self-inventory: Pt states that she must return home no later than Sunday due to an important meeting at her work on Monday morning to determine her new job position that will reduce her stress and anxiety in the workplace. Pt signed 72 hour request for discharge at 8:10AM on 03/20/17 (up on Saturday, 03/23/17 at 8:10AM).  Other: n/a   New problem(s) identified: No, Describe:  n/a  New Short Term/Long Term Goal(s): medication management for mood stabilization; development of comprehensive mental wellness plan; elimination of SI thoughts.   Patient Goal: "To get to feeling better and go home."   Discharge Plan or Barriers:   Reason for Continuation of Hospitalization: Anxiety Depression Medication stabilization Suicidal ideation  Estimated Length of  Stay: Saturday no later than 8:10AM (72 hour request for discharge).   Attendees: Patient: 03/20/2017 9:48 AM  Physician: Dr. Jama Flavors MD 03/20/2017 9:48 AM  Nursing: [name]; [name] 03/20/2017 9:48 AM  RN Care Manager: [name] CM 03/20/2017 9:48 AM  Social Worker: Trula Slade, LCSW 03/20/2017 9:48 AM  Recreational Therapist: x 03/20/2017 9:48 AM  Other: Armandina Stammer NP; Hillery Jacks NP; Feliz Beam Money NP 03/20/2017 9:48 AM  Other:  03/20/2017 9:48 AM  Other: 03/20/2017 9:48 AM    Scribe for Treatment Team: Ledell Peoples Smart, LCSW 03/20/2017 9:48 AM

## 2017-03-20 NOTE — Progress Notes (Signed)
Recreation Therapy Notes  Date: 03/20/17 Time: 0930 Location: 300 Hall Dayroom  Group Topic: Stress Management  Goal Area(s) Addresses:  Patient will verbalize importance of using healthy stress management.  Patient will identify positive emotions associated with healthy stress management.   Behavioral Response: Engaged  Intervention: Stress Management  Activity :  Meditation.  LRT introduced the stress management technique of meditation.  LRT played Galvan meditation from the Calm app that allowed patients the opportunity to take inventory of any feelings or sensations they were feeling throughout their bodies.  Patients were to listen and follow along as the meditation played.  Education:  Stress Management, Discharge Planning.   Education Outcome: Acknowledges edcuation/In group clarification offered/Needs additional education  Clinical Observations/Feedback: Pt attended group.   Stefanie Galvan, LRT/CTRS         Stefanie Galvan 03/20/2017 11:35 AM 

## 2017-03-20 NOTE — Progress Notes (Signed)
Stefanie Galvan had been up and visible in milieu this evening, she attended and participated in group activity. She spoke about being admitted earlier in the day and spoke about how she is here due to on-going depression that she has been having. She was seen interacting appropriately with peers in milieu, she denied any complaints of pain and was able to receive all bedtime medication without incident. A. Support and encouragement provided. R. Safety maintained, will continue to monitor.

## 2017-03-21 ENCOUNTER — Ambulatory Visit (HOSPITAL_COMMUNITY): Payer: Self-pay

## 2017-03-21 ENCOUNTER — Other Ambulatory Visit (HOSPITAL_COMMUNITY): Payer: Self-pay

## 2017-03-21 DIAGNOSIS — F39 Unspecified mood [affective] disorder: Secondary | ICD-10-CM

## 2017-03-21 DIAGNOSIS — F419 Anxiety disorder, unspecified: Secondary | ICD-10-CM

## 2017-03-21 NOTE — [facility] Group Notes (Signed)
Adult Psychoeducational Group Note  Date:  03/21/2017 Time:  5:41 PM  Group Topic/Focus:  Goals Group:   The focus of this group is to help patients establish daily goals to achieve during treatment and discuss how the patient can incorporate goal setting into their daily lives to aide in recovery.  Participation Level:  Active  Participation Quality:  Appropriate  Affect:  Appropriate  Cognitive:  Alert  Insight: Appropriate  Engagement in Group:  Engaged  Modes of Intervention:  Activity  Additional Comments:  Pt was alert and engaged in group activity. Pt goal for today is to go home.  Dellia Nims 03/21/2017, 5:41 PM

## 2017-03-21 NOTE — Progress Notes (Signed)
Patient ID: Stefanie Galvan, female   DOB: 1965/12/28, 51 y.o.   MRN: [id number]  D: Patient in room resting. Pt reports she is excited about discharge tomorrow. Pt attended evening wrap up group and engaged in discussions. Denies SI/HI/AVH.  A: Support and encouragement offered as needed. Medications administered as prescribed.  R: Patient is safe and cooperative on unit. Will continue to monitor for safety and stability.

## 2017-03-21 NOTE — Progress Notes (Signed)
[facility] 2 MD Progress Note  03/21/2017 2:24 PM Stefanie Galvan  MRN:  [id number]   Subjective:  Patient reports that she has been doing great and denies any depression, anxiety, SI/HI/AVH. She wants to be discharged soon so she can go back to partial. She reports sleeping pretty good but she has some hip pain from the mattress, but she knows it will be better when she gets back home.  Objective: Patient's chart and findings reviewed and discussed with treatment team. Patient has been very cooperative and pleasant. She has been actively involved in group and has been interacting appropriately with staff and other patients. Will continue her medications at the current regimen. Will discuss with CSW about discharge tomorrow and patient being re-established with partial hospitalization.  Principal Problem: Bipolar 1 disorder, depressed ([place]) Diagnosis:   Patient Active Problem List   Diagnosis Date Noted  . Bipolar 1 disorder, depressed ([place]) [F31.9] 03/19/2017  . Bipolar 2 disorder, major depressive episode ([place]) [F31.81] 03/04/2017    Class: Chronic  . Chronic post-traumatic stress disorder (PTSD) [F43.12] 03/04/2017    Class: Chronic   Total Time spent with patient: 15 minutes  Past Psychiatric History: See H&P  Past Medical History:  Past Medical History:  Diagnosis Date  . Arthritis   . Depression 2.5 yrs ago   situational dep-no current tx/meds  . TBI (traumatic brain injury) ([place]) 11/10/2005    Past Surgical History:  Procedure Laterality Date  . BREAST REDUCTION SURGERY  02/25/2012   Procedure: MAMMARY REDUCTION  (BREAST);  Surgeon: Cristine Polio, MD;  Location: [facility];  Service: Plastics;  Laterality: Bilateral;  bilateral reduction  . DILATION AND CURETTAGE OF UTERUS  2001  . [place]; 1996   X 2; bilateral  . TUBAL LIGATION  2003   Family History: History reviewed. No pertinent family history. Family Psychiatric  History:  See H&P Social History:  History  Alcohol Use No    Comment: very rare     History  Drug Use No    Social History   Social History  . Marital status: Divorced    Spouse name: N/A  . Number of children: N/A  . Years of education: N/A   Social History Main Topics  . Smoking status: Never Smoker  . Smokeless tobacco: Never Used  . Alcohol use No     Comment: very rare  . Drug use: No  . Sexual activity: Not Currently   Other Topics Concern  . None   Social History Narrative  . None   Additional Social History:    Pain Medications: see MAR Prescriptions: See MAR History of alcohol / drug use?: No history of alcohol / drug abuse                    Sleep: Fair  Appetite:  Good  Current Medications: Current Facility-Administered Medications  Medication Dose Route Frequency Provider Last Rate Last Dose  . acetaminophen (TYLENOL) tablet 650 mg  650 mg Oral Q6H PRN Okonkwo, Justina A, NP      . alum & mag hydroxide-simeth (MAALOX/MYLANTA) 200-200-20 MG/5ML suspension 30 mL  30 mL Oral Q4H PRN Okonkwo, Justina A, NP      . escitalopram (LEXAPRO) tablet 10 mg  10 mg Oral QHS Hampton Abbot, MD   10 mg at 03/20/17 2128  . hydrOXYzine (ATARAX/VISTARIL) tablet 25 mg  25 mg Oral TID PRN Lu Duffel, Justina A, NP   25 mg at  03/20/17 2130  . lithium carbonate capsule 300 mg  300 mg Oral TID Lu Duffel, Justina A, NP   300 mg at 03/21/17 1203  . magnesium hydroxide (MILK OF MAGNESIA) suspension 30 mL  30 mL Oral Daily PRN Lu Duffel, Justina A, NP      . traZODone (DESYREL) tablet 100 mg  100 mg Oral QHS Okonkwo, Justina A, NP   100 mg at 03/20/17 2128    Lab Results:  Results for orders placed or performed during the hospital encounter of 03/19/17 (from the past 48 hour(s))  Urine rapid drug screen (hosp performed)not at [facility]     Status: None   Collection Time: 03/19/17  5:05 PM  Result Value Ref Range   Opiates NONE DETECTED NONE DETECTED   Cocaine NONE DETECTED NONE  DETECTED   Benzodiazepines NONE DETECTED NONE DETECTED   Amphetamines NONE DETECTED NONE DETECTED   Tetrahydrocannabinol NONE DETECTED NONE DETECTED   Barbiturates NONE DETECTED NONE DETECTED    Comment:        DRUG SCREEN FOR MEDICAL PURPOSES ONLY.  IF CONFIRMATION IS NEEDED FOR ANY PURPOSE, NOTIFY LAB WITHIN 5 DAYS.        LOWEST DETECTABLE LIMITS FOR URINE DRUG SCREEN Drug Class       Cutoff (ng/mL) Amphetamine      1000 Barbiturate      200 Benzodiazepine   967 Tricyclics       591 Opiates          300 Cocaine          300 THC              50 Performed at [facility], [place] [address], [place], [place] [address]   Urinalysis, Routine w reflex microscopic     Status: Abnormal   Collection Time: 03/19/17  5:05 PM  Result Value Ref Range   Color, Urine YELLOW YELLOW   APPearance HAZY (A) CLEAR   Specific Gravity, Urine 1.021 1.005 - 1.030   pH 8.0 5.0 - 8.0   Glucose, UA NEGATIVE NEGATIVE mg/dL   Hgb urine dipstick NEGATIVE NEGATIVE   Bilirubin Urine NEGATIVE NEGATIVE   Ketones, ur NEGATIVE NEGATIVE mg/dL   Protein, ur 30 (A) NEGATIVE mg/dL   Nitrite NEGATIVE NEGATIVE   Leukocytes, UA TRACE (A) NEGATIVE   RBC / HPF 0-5 0 - 5 RBC/hpf   WBC, UA 6-30 0 - 5 WBC/hpf   Bacteria, UA NONE SEEN NONE SEEN   Squamous Epithelial / LPF 0-5 (A) NONE SEEN   Mucus PRESENT     Comment: Performed at [facility], [place] [address], [place], [place] [address]  CBC     Status: None   Collection Time: 03/19/17  6:22 PM  Result Value Ref Range   WBC 6.4 4.0 - 10.5 K/uL   RBC 4.49 3.87 - 5.11 MIL/uL   Hemoglobin 12.8 12.0 - 15.0 g/dL   HCT 36.9 36.0 - 46.0 %   MCV 82.2 78.0 - 100.0 fL   MCH 28.5 26.0 - 34.0 pg   MCHC 34.7 30.0 - 36.0 g/dL   RDW 13.1 11.5 - 15.5 %   Platelets 237 150 - 400 K/uL    Comment: Performed at [facility], [place] [address] [place], [place] [address]  Comprehensive metabolic panel     Status: Abnormal    Collection Time: 03/19/17  6:22 PM  Result Value Ref Range   Sodium 138 135 - 145 mmol/L  Potassium 3.9 3.5 - 5.1 mmol/L   Chloride 104 101 - 111 mmol/L   CO2 26 22 - 32 mmol/L   Glucose, Bld 126 (H) 65 - 99 mg/dL   BUN 13 6 - 20 mg/dL   Creatinine, Ser 1.03 (H) 0.44 - 1.00 mg/dL   Calcium 9.4 8.9 - 10.3 mg/dL   Total Protein 8.1 6.5 - 8.1 g/dL   Albumin 4.7 3.5 - 5.0 g/dL   AST 19 15 - 41 U/L   ALT 19 14 - 54 U/L   Alkaline Phosphatase 72 38 - 126 U/L   Total Bilirubin 0.6 0.3 - 1.2 mg/dL   GFR calc non Af Amer >60 >60 mL/min   GFR calc Af Amer >60 >60 mL/min    Comment: (NOTE) The eGFR has been calculated using the CKD EPI equation. This calculation has not been validated in all clinical situations. eGFR's persistently <60 mL/min signify possible Chronic Kidney Disease.    Anion gap 8 5 - 15    Comment: Performed at [facility], [place] [address] [place], [place] [address]  Hemoglobin A1c     Status: None   Collection Time: 03/19/17  6:22 PM  Result Value Ref Range   Hgb A1c MFr Bld 4.9 4.8 - 5.6 %    Comment: (NOTE) Pre diabetes:          5.7%-6.4% Diabetes:              >6.4% Glycemic control for   <7.0% adults with diabetes    Mean Plasma Glucose 93.93 mg/dL    Comment: Performed at [place] [address], [place], [place] [address]  TSH     Status: None   Collection Time: 03/19/17  6:22 PM  Result Value Ref Range   TSH 3.823 0.350 - 4.500 uIU/mL    Comment: Performed by a 3rd Generation assay with a functional sensitivity of <=0.01 uIU/mL. Performed at [facility], [place] [address] [place], [place] [address]   Lithium level     Status: None   Collection Time: 03/19/17  6:22 PM  Result Value Ref Range   Lithium Lvl 1.00 0.60 - 1.20 mmol/L    Comment: Performed at [facility], [place] [address], [place], [place] [address]  Lipid panel     Status: Abnormal   Collection Time: 03/20/17  6:23 AM   Result Value Ref Range   Cholesterol 259 (H) 0 - 200 mg/dL   Triglycerides 145 <150 mg/dL   HDL 51 >40 mg/dL   Total CHOL/HDL Ratio 5.1 RATIO   VLDL 29 0 - 40 mg/dL   LDL Cholesterol 179 (H) 0 - 99 mg/dL    Comment:        Total Cholesterol/HDL:CHD Risk Coronary Heart Disease Risk Table                     Men   Women  1/2 Average Risk   3.4   3.3  Average Risk       5.0   4.4  2 X Average Risk   9.6   7.1  3 X Average Risk  23.4   11.0        Use the calculated Patient Ratio above and the CHD Risk Table to determine the patient's CHD Risk.        ATP III CLASSIFICATION (LDL):  <100     mg/dL   Optimal  100-129  mg/dL  Near or Above                    Optimal  130-159  mg/dL   Borderline  160-189  mg/dL   High  >190     mg/dL   Very High Performed at [place] [address], [place], [place] [address]     Blood Alcohol level:  Lab Results  Component Value Date   [facility]  09/12/2008    <5        LOWEST DETECTABLE LIMIT FOR SERUM ALCOHOL IS 5 mg/dL FOR MEDICAL PURPOSES ONLY    Metabolic Disorder Labs: Lab Results  Component Value Date   HGBA1C 4.9 03/19/2017   MPG 93.93 03/19/2017   No results found for: PROLACTIN Lab Results  Component Value Date   CHOL 259 (H) 03/20/2017   TRIG 145 03/20/2017   HDL 51 03/20/2017   CHOLHDL 5.1 03/20/2017   VLDL 29 03/20/2017   LDLCALC 179 (H) 03/20/2017    Physical Findings: AIMS:  , ,  ,  ,    CIWA:    COWS:     Musculoskeletal: Strength & Muscle Tone: within normal limits Gait & Station: normal Patient leans: N/A  Psychiatric Specialty Exam: Physical Exam  ROS  Blood pressure 106/73, pulse 82, temperature 98.5 F (36.9 C), temperature source Oral, resp. rate 16, height 5' 8"  (1.727 m), weight 97.5 kg (215 lb), SpO2 99 %.Body mass index is 32.69 kg/m.  General Appearance: Casual  Eye Contact:  Good  Speech:  Clear and Coherent and Normal Rate  Volume:  Normal  Mood:  Euthymic  Affect:   Appropriate  Thought Process:  Coherent and Descriptions of Associations: Intact  Orientation:  Full (Time, Place, and Person)  Thought Content:  WDL  Suicidal Thoughts:  No  Homicidal Thoughts:  No  Memory:  Immediate;   Good Recent;   Good Remote;   Good  Judgement:  Good  Insight:  Good  Psychomotor Activity:  Normal  Concentration:  Concentration: Good and Attention Span: Good  Recall:  Good  Fund of Knowledge:  Good  Language:  Good  Akathisia:  No  Handed:  Right  AIMS (if indicated):     Assets:  Communication Skills Desire for Improvement Financial Resources/Insurance Housing Social Support Transportation  ADL's:  Intact  Cognition:  WNL  Sleep:  Number of Hours: 6.5     Treatment Plan Summary: Daily contact with patient to assess and evaluate symptoms and progress in treatment, Medication management and Plan is to:  -Continue Lithium 300 mg TID for mood stability -Continue Lexapro 10 mg PO Daily for mood stability -Continue Trazodone 100 mg PO QHS for mood stability -Continue Vistaril 25 mg PO TID PRN for anxiety -Encourage group therapy participation -Discharge tomorrow possibly  Lewis Shock, FNP 03/21/2017, 2:24 PM   Agree with NP Progress Note

## 2017-03-21 NOTE — Progress Notes (Signed)
Pt did not attend wrap-up group   

## 2017-03-21 NOTE — BHH Group Notes (Signed)
LCSW Group Therapy Note  03/21/2017 1:15pm  Type of Therapy/Topic:  Group Therapy:  Balance in Life  Participation Level:  Active  Description of Group:    This group will address the concept of balance and how it feels and looks when one is unbalanced. Patients will be encouraged to process areas in their lives that are out of balance and identify reasons for remaining unbalanced. Facilitators will guide patients in utilizing problem-solving interventions to address and correct the stressor making their life unbalanced. Understanding and applying boundaries will be explored and addressed for obtaining and maintaining a balanced life. Patients will be encouraged to explore ways to assertively make their unbalanced needs known to significant others in their lives, using other group members and facilitator for support and feedback.  Therapeutic Goals: 1. Patient will identify two or more emotions or situations they have that consume much of in their lives. 2. Patient will identify signs/triggers that life has become out of balance:  3. Patient will identify two ways to set boundaries in order to achieve balance in their lives:  4. Patient will demonstrate ability to communicate their needs through discussion and/or role plays  Summary of Patient Progress: Pt was present for the duration of the group. Pt states that she is somewhere between balanced and unbalanced. Pt reports feeling more balance since she's been admitted to the hospital. Pt states that she plans to incorporate several coping skills once she leaves such as mindfulness exercises and keeping a gratitude journal. Pt's affect was appropriate and pt's insight continues to improve.   Therapeutic Modalities:   Cognitive Behavioral Therapy Solution-Focused Therapy Assertiveness Training  Jonathon Jordan, MSW, LCSWA 03/21/2017 4:20 PM

## 2017-03-21 NOTE — Progress Notes (Signed)
D: Patient presents with flat affect and her mood appears sad and depressed.  She denies any depressive symptoms.  She denies any hopelessness or anxiety.  She is sleeping and eating good; her energy level is normal and her concentration is good.  She is observed in the day room interacting with others.  She continues to receive her lithium.  Patient seems minimal with staff; she forwards little information about herself. She denies any thoughts of self harm. A: Continue to monitor medication management and MD orders.  Safety checks completed every 15 minutes per protocol.  Offer support and encouragement as needed. R: Patient is receptive to staff; her behavior is appropriate.

## 2017-03-21 NOTE — Plan of Care (Signed)
Problem: Safety: Goal: Periods of time without injury will increase Outcome: Progressing Pt denies SI/HI/AVH. Pt is safe on the unit

## 2017-03-22 ENCOUNTER — Other Ambulatory Visit (HOSPITAL_COMMUNITY): Payer: Self-pay

## 2017-03-22 DIAGNOSIS — F17210 Nicotine dependence, cigarettes, uncomplicated: Secondary | ICD-10-CM

## 2017-03-22 MED ORDER — LITHIUM CARBONATE 300 MG PO CAPS: 300.0000 mg | ORAL_CAPSULE | Freq: Three times a day (TID) | ORAL | 0 refills | Status: DC

## 2017-03-22 MED ORDER — HYDROXYZINE HCL 25 MG PO TABS: 25.0000 mg | ORAL_TABLET | Freq: Three times a day (TID) | ORAL | 0 refills | Status: DC | PRN

## 2017-03-22 MED ORDER — ESCITALOPRAM OXALATE 10 MG PO TABS: 10.0000 mg | ORAL_TABLET | Freq: Every day | ORAL | 0 refills | Status: DC

## 2017-03-22 MED ORDER — TRAZODONE HCL 100 MG PO TABS: 100.0000 mg | ORAL_TABLET | Freq: Every day | ORAL | 0 refills | Status: DC

## 2017-03-22 NOTE — Progress Notes (Signed)
Recreation Therapy Notes  Date: 03/22/17 Time: 0930 Location: 300 Hall Dayroom  Group Topic: Stress Management  Goal Area(s) Addresses:  Patient will verbalize importance of using healthy stress management.  Patient will identify positive emotions associated with healthy stress management.   Behavioral Response: Engaged  Intervention: Stress Management  Activity :  Progressive Muscle Relaxation.  LRT introduced the stress management technique of progressive muscle relaxation.  LRT read Galvan script to guide patients through tensing and relaxing each muscle group.  Patients were to follow along as script was read to engage in the activity.  Education:  Stress Management, Discharge Planning.   Education Outcome: Acknowledges edcuation/In group clarification offered/Needs additional education  Clinical Observations/Feedback: Pt attended group.   Stefanie Galvan, LRT/CTRS         Stefanie Galvan, Stefanie Galvan 03/22/2017 11:22 AM

## 2017-03-22 NOTE — Progress Notes (Signed)
Pt has completed her daily assessment and on this she wrote  She deneid SI today and she rated her depression, hopelessness and anxiety " 0/0/0/", resepctively. Pt is prepared for discharge to home by this Clinical research associate. ALl DC instructions are reviewed with pt by this writer, with pt stated verbal understanding and willingness to comply. CC of dc instructions  ( AVS, SRA, transition  Record and SSP) given to pt and belongings were returned to her per routine and then she was escorted to building entrance and dc'd ambulato0ry.
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
## 2017-03-22 NOTE — BHH Suicide Risk Assessment (Signed)
[facility] Discharge Suicide Risk Assessment   Principal Problem: Bipolar 1 disorder, depressed [facility]) Discharge Diagnoses:  Patient Active Problem List   Diagnosis Date Noted  . Bipolar 1 disorder, depressed (HCC) [F31.9] 03/19/2017  . Bipolar 2 disorder, major depressive episode (HCC) [F31.81] 03/04/2017    Class: Chronic  . Chronic post-traumatic stress disorder (PTSD) [F43.12] 03/04/2017    Class: Chronic    Total Time spent with patient: 30 minutes  Musculoskeletal: Strength & Muscle Tone: within normal limits Gait & Station: normal Patient leans: N/A  Psychiatric Specialty Exam: ROS no headache, no chest pain , no shortness of breath, no vomiting , no rash  Blood pressure 104/72, pulse 82, temperature 99 F (37.2 C), temperature source Oral, resp. rate 18, height  (1.727 m), weight 97.5 kg (215 lb), SpO2 99 %.Body mass index is 32.69 kg/m.  General Appearance: Well Groomed  Eye Contact::  Good  Speech:  Normal Rate409  Volume:  Normal  Mood:  reports feeling better, mood presents euthymic  Affect:  Appropriate and Full Range  Thought Process:  Linear and Descriptions of Associations: Intact  Orientation:  Full (Time, Place, and Person)  Thought Content:  denies hallucinations, no delusions, not internally preoccupied   Suicidal Thoughts:  No denies any suicidal or self injurious ideations, denies any homicidal or violent ideations   Homicidal Thoughts:  No  Memory:  recent and remote grossly intact   Judgement:  Other:  improved   Insight:  improved   Psychomotor Activity:  Normal  Concentration:  Good  Recall:  Good  Fund of Knowledge:Good  Language: Good  Akathisia:  Negative  Handed:  Right  AIMS (if indicated):     Assets:  Communication Skills Desire for Improvement Resilience  Sleep:  Number of Hours: 5.25  Cognition: WNL  ADL's:  Intact   Mental Status Per Nursing Assessment::   On Admission:  Suicidal ideation indicated by patient, Suicide  plan  Demographic Factors:  51 year old married ( separated ) female , three adult children, patient lives with mother   Loss Factors: No acute stressors identified   Historical Factors: History of bipolar disorder diagnosis, history of PTSD symptoms, 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  Behavior on unit in good control. Pleasant on approach. Denies  Side effects at this time. We have reviewed side effect profile, including symptoms associated with Li toxicity.  Cognitive Features That Contribute To Risk:  No gross cognitive deficits noted upon discharge. Is alert , attentive, and oriented x 3   Suicide Risk:  Mild:  Suicidal ideation of limited frequency, intensity, duration, and specificity.  There are no identifiable plans, no associated intent, mild dysphoria and related symptoms, good self-control (both objective and subjective assessment), few other risk factors, and identifiable protective factors, including available and accessible social support.  Follow-up Information    BEHAVIORAL HEALTH PARTIAL HOSPITALIZATION PROGRAM Follow up on 03/25/2017.   Specialty:  Behavioral Health Why:  PHP appointment 10/8 at 9am to continue the program.  Contact information: [address] [place] [address] [id number] mc [place] [address] [phone]          Plan Of Care/Follow-up recommendations:  Activity:  as tolerated  Diet:  Regular  Tests:  NA Other:  See below   Patient is leaving unit in good spirits  Plans to return home Follow up as above  Has an established PCP for medical issues as needed   Craige Cotta,  MD 03/22/2017, 1:29 PM

## 2017-03-22 NOTE — Progress Notes (Signed)
  [facility] Adult Case Management Discharge Plan :  Will you be returning to the same living situation after discharge:  Yes,  Pt returning home At discharge, do you have transportation home?: Yes,  Pt family to pick up Do you have the ability to pay for your medications: Yes,  Pt provided with prescriptions  Release of information consent forms completed and in the chart;  Patient's signature needed at discharge.  Patient to Follow up at: Follow-up Information    BEHAVIORAL HEALTH PARTIAL HOSPITALIZATION PROGRAM Follow up on 03/25/2017.   Specialty:  Behavioral Health Why:  PHP appointment 10/8 at 9am to continue the program.  Contact information: [address] [place] [address] [id number] mc [place] [address] [phone]          Next level of care provider has access to [facility] Link:yes  Safety Planning and Suicide Prevention discussed: Yes,  with mother; see SPE note  Have you used any form of tobacco in the last 30 days? (Cigarettes, Smokeless Tobacco, Cigars, and/or Pipes): No  Has patient been referred to the Quitline?: N/A patient is not a smoker  Patient has been referred for addiction treatment: Yes  Verdene Lennert, LCSW 03/22/2017, 9:14 AM

## 2017-03-25 ENCOUNTER — Other Ambulatory Visit (HOSPITAL_COMMUNITY): Payer: PRIVATE HEALTH INSURANCE | Admitting: Licensed Clinical Social Worker

## 2017-03-25 DIAGNOSIS — F4312 Post-traumatic stress disorder, chronic: Secondary | ICD-10-CM | POA: Diagnosis not present

## 2017-03-25 DIAGNOSIS — F329 Major depressive disorder, single episode, unspecified: Secondary | ICD-10-CM | POA: Diagnosis present

## 2017-03-25 DIAGNOSIS — F314 Bipolar disorder, current episode depressed, severe, without psychotic features: Secondary | ICD-10-CM

## 2017-03-25 DIAGNOSIS — F4310 Post-traumatic stress disorder, unspecified: Secondary | ICD-10-CM

## 2017-03-26 ENCOUNTER — Other Ambulatory Visit (HOSPITAL_COMMUNITY): Payer: PRIVATE HEALTH INSURANCE | Admitting: Licensed Clinical Social Worker

## 2017-03-26 ENCOUNTER — Other Ambulatory Visit (HOSPITAL_COMMUNITY): Payer: PRIVATE HEALTH INSURANCE | Admitting: Occupational Therapy

## 2017-03-26 DIAGNOSIS — F329 Major depressive disorder, single episode, unspecified: Secondary | ICD-10-CM | POA: Diagnosis not present

## 2017-03-26 DIAGNOSIS — F3181 Bipolar II disorder: Secondary | ICD-10-CM

## 2017-03-26 DIAGNOSIS — F319 Bipolar disorder, unspecified: Secondary | ICD-10-CM

## 2017-03-26 NOTE — Therapy (Signed)
[facility] PARTIAL HOSPITALIZATION PROGRAM [address] [place], [place], [address] Phone: [phone]   Fax:  [phone]  Occupational Therapy Treatment  Patient Details  Name: Stefanie Galvan MRN: [id number] Date of Birth: [birth year] Referring Provider: Dr. Carolanne Grumbling  Encounter Date: 03/26/2017      OT End of Session - 03/26/17 1502    Visit Number 5   Number of Visits 6   Date for OT Re-Evaluation 03/29/17   Authorization Type [employer]   OT Start Time 1030   OT Stop Time 1130   OT Time Calculation (min) 60 min   Activity Tolerance Patient tolerated treatment well   Behavior During Therapy [facility] for tasks assessed/performed      Past Medical History:  Diagnosis Date  . Arthritis   . Depression 2.5 yrs ago   situational dep-no current tx/meds  . TBI (traumatic brain injury) (HCC) 11/10/2005    Past Surgical History:  Procedure Laterality Date  . BREAST REDUCTION SURGERY  02/25/2012   Procedure: MAMMARY REDUCTION  (BREAST);  Surgeon: Louisa Second, MD;  Location: [facility];  Service: Plastics;  Laterality: Bilateral;  bilateral reduction  . DILATION AND CURETTAGE OF UTERUS  2001  . TEMPOROMANDIBULAR JOINT SURGERY  1984; 1996   X 2; bilateral  . TUBAL LIGATION  2003    There were no vitals filed for this visit.      Subjective Assessment - 03/26/17 1501    Currently in Pain? No/denies            [facility] OT Assessment - 03/26/17 1501      Assessment   Diagnosis Bipolar Disorder     Precautions   Precautions None         OT Group: Communication Skills-Active Listening and Open Discussion   S:  "I think I can be assertive when I need to be."   O:  Patient actively participated in the following skilled occupational therapy treatment session this date:             Social and communication skills: Pt participated in group focusing on active listening and open communication via dialogue. Also discussed  assertiveness and appropriate communication skills/behavior. Pt participated in small group/pair activity-participants given a subject to discuss. One person talks about the subject for 3 minutes while the other person practices active listening; then the listener recapped what the speaker said but did not include opinions or personal thoughts. Pairs/groups then switch roles for the next subject. At end of small group activity all participants reconvene for large group discussion on a potentially controversial topic. Today's topic was social media and whether it is good or bad. Pt was actively engaged during both small and large group activities and contributed to the discussion.    A:  Patient participated in skilled occupational therapy group for social and communication skills this date.  Patient was engaged during activities and provided thoughts and feelings on session.      P:  Continue participation in skilled occupational therapy groups  1-2 times per week for 2 weeks in order to gain the necessary skills needed to return to full time community living and learn effective coping strategies to be a productive community resident.            OT Short Term Goals - 03/07/17 1459      OT SHORT TERM GOAL #1   Title Patient will be educated on strategies to improve psychosocial skills needed to  participate fully in all daily, work, and leisure activities.   Time 3   Period Weeks   Status On-going     OT SHORT TERM GOAL #2   Title Patient will be educated on a HEP and independent with implementation of HEP.   Time 3   Period Weeks   Status On-going     OT SHORT TERM GOAL #3   Title Patient will independently apply psychosocial skills and coping mechanisms to her daily activities in order to function independently.   Time 3   Period Weeks   Status On-going                  Plan - 03/26/17 1502    Consulted and Agree with Plan of Care Patient      Patient will benefit  from skilled therapeutic intervention in order to improve the following deficits and impairments:   (decreased coping skills, decreased psychosocial skills)  Visit Diagnosis: Bipolar 1 disorder, depressed (HCC)    Problem List Patient Active Problem List   Diagnosis Date Noted  . Bipolar 1 disorder, depressed (HCC) 03/19/2017  . Bipolar 2 disorder, major depressive episode (HCC) 03/04/2017    Class: Chronic  . Chronic post-traumatic stress disorder (PTSD) 03/04/2017    Class: Chronic   Ezra Sites, OTR/L  [phone] 03/26/2017, 3:02 PM  [facility] PARTIAL HOSPITALIZATION PROGRAM [address] [place], [place], [address] Phone: [phone]   Fax:  [phone]  Name: ARIBELLA VAVRA MRN: [id number] Date of Birth: 05/19/1966

## 2017-03-26 NOTE — Psych (Signed)
   [name] PHP THERAPIST PROGRESS NOTE  Stefanie Galvan [id number]  Session Time: 9 am - 2 pm  Participation Level: Active  Behavioral Response: CasualAlertEuthymic  Type of Therapy: Group Therapy; Psychotherapy,  Psychoeducation, Medication Group, Activity therapy  Treatment Goals addressed: Coping  Interventions: CBT, DBT, Supportive and Reframing  Summary:  9:00 - 11:00: Pharmacist discussed medication with group and answered questions.  11:00 -12:15: Clinician led check-in regarding current stressors and situation, and review of patient completed daily inventory. Clinician utilized active listening and empathetic response and validated patient emotions. Clinician facilitated processing group on pertinent issues.  12:15 - 1:00 Reflection group: Patients encouraged to practice skills and interpersonal techniques or work on mindfulness and relaxation techniques. The importance of self-care and making skills part of a routine to increase usage were stressed.  1:00 - 1:50 Clinician introduced topic of "Distress Tolerance". Clinician discussed Self-Soothe and how to utilize. Pt's identified ways to incorporate each of the five senses in a relevant way to them.  1:50 - 2:00 Clinician led check-out. Clinician assessed for immediate needs, medication compliance and efficacy, and safety concerns.    Suicidal/Homicidal: Nowithout intent/plan  Therapist Response: Stefanie Galvan is a 51 y.o. female who presents with bipolar and trauma symptoms. Patient arrived within time allowed and reports she is feeling "good." Patient rates her mood at an 8.5 on a 1-10 scale with 10 being great. Pt is returning to group post-discharge from inpatient admission for SI. Pt reports feeling increased stability since last in group. Pt reports she spent time with family over the weekend celebrating her birthday. Pt reports some anxiety about a meeting with her manager this afternoon. Patient engaged in  activity and discussion. Patient demonstrates some progress as evidenced by reporting increased ability to manage negative thoughts. Patient denies SI/HI/self-harm thoughts at the end of group.    Plan: Pt will continue in PHP to increase ability to manage emotions and distress while decreasing depression and trauma symptoms.   Diagnosis: Bipolar disorder, current episode depressed, severe, without psychotic features (HCC) [F31.4]    1. Bipolar disorder, current episode depressed, severe, without psychotic features (HCC)   2. PTSD (post-traumatic stress disorder)       [name], LCSW 03/26/2017

## 2017-03-26 NOTE — Progress Notes (Signed)
Progress note for re admission         Stefanie Galvan was discharged from inpatient and re admitted to [facility].  No suicidal thinking and mood is much improved.  She is more optimistic about her future and considering returning to work part time.          Diagnosis is bipolar 2 depressed and chronic PTSD        Plan is to complete one week in PHP and then transfer to IOP and determine day by day when she is ready to be discharged to outpatient.  Continue escitalopram 10 mg daily, lithium 300 mg tid, trazodone 100 mg hs and hydroxyzine25 mg tid as needed

## 2017-03-27 ENCOUNTER — Other Ambulatory Visit (HOSPITAL_COMMUNITY): Payer: PRIVATE HEALTH INSURANCE | Admitting: Licensed Clinical Social Worker

## 2017-03-27 DIAGNOSIS — F329 Major depressive disorder, single episode, unspecified: Secondary | ICD-10-CM | POA: Diagnosis not present

## 2017-03-27 DIAGNOSIS — F4310 Post-traumatic stress disorder, unspecified: Secondary | ICD-10-CM

## 2017-03-27 DIAGNOSIS — F3181 Bipolar II disorder: Secondary | ICD-10-CM

## 2017-03-28 ENCOUNTER — Other Ambulatory Visit (HOSPITAL_COMMUNITY): Payer: PRIVATE HEALTH INSURANCE | Admitting: Licensed Clinical Social Worker

## 2017-03-28 ENCOUNTER — Encounter (HOSPITAL_COMMUNITY): Payer: Self-pay | Admitting: Occupational Therapy

## 2017-03-28 ENCOUNTER — Other Ambulatory Visit (HOSPITAL_COMMUNITY): Payer: PRIVATE HEALTH INSURANCE | Admitting: Occupational Therapy

## 2017-03-28 DIAGNOSIS — F3181 Bipolar II disorder: Secondary | ICD-10-CM

## 2017-03-28 DIAGNOSIS — F314 Bipolar disorder, current episode depressed, severe, without psychotic features: Secondary | ICD-10-CM

## 2017-03-28 DIAGNOSIS — F329 Major depressive disorder, single episode, unspecified: Secondary | ICD-10-CM | POA: Diagnosis not present

## 2017-03-28 DIAGNOSIS — F4310 Post-traumatic stress disorder, unspecified: Secondary | ICD-10-CM

## 2017-03-28 NOTE — Psych (Signed)
  [facility] Partial Hospitalization Program Psych Discharge Summary  Stefanie Galvan [id number]  Admission date: 03/19/2017 Discharge date: 03/29/2017  Reason for admission: post inpatient stay for depression  Progress in Program Toward Treatment Goals: doing very well, depression much decreased  Progress (rationale): medication probably taking effect and she is very good at using coping skills  Discharge Plan: IOP recommended but if she continues doing so well it will be brief    Carolanne Grumbling, MD 03/28/2017

## 2017-03-28 NOTE — Psych (Signed)
   [facility] BH PHP THERAPIST PROGRESS NOTE  Stefanie Galvan [id number]  Session Time: 9 am - 2 pm  Participation Level: Active  Behavioral Response: CasualAlertEuthymic  Type of Therapy: Group Therapy; Psychotherapy,  Psychoeducation, Activity therapy, OT  Treatment Goals addressed: Coping  Interventions: CBT, DBT, Supportive and Reframing  Summary:  9:00 - 10:30 Clinician led check-in regarding current stressors and situation, and review of patient completed daily inventory. Clinician utilized active listening and empathetic response and validated patient emotions. Clinician facilitated processing group on pertinent issues.  10:30 -11:30: OT Group  11:30-12:00: Clinician led group discussion on decision making and how to make informed decisions.  12:00 - 12:45 Reflection group: Patients encouraged to practice skills and interpersonal techniques or work on mindfulness and relaxation techniques. The importance of self-care and making skills part of a routine to increase usage were stressed.  12:45 - 1:50 Clinician led discussion on friendship and support systems. Cln provided psychoeducation on how to build and foster strong relationships.  1:50 - 2:00 Clinician led check-out. Clinician assessed for immediate needs, medication compliance and efficacy, and safety concerns.    Suicidal/Homicidal: Nowithout intent/plan  Therapist Response: Stefanie Galvan is a 51 y.o. female who presents with bipolar and trauma symptoms. Patient arrived within time allowed and reports she is feeling "not over burdened." Patient rates her mood at an 9 on a 1-10 scale with 10 being great. Pt shares the meeting with her manager went well and she is feeling positive about going back to work with accommodations. Pt reports she did not wear makeup today and feels that is progress as she is working on liking herself more. Patient engaged in activity and discussion. Patient demonstrates some progress as  evidenced by reporting improved sleep and mood. Patient denies SI/HI/self-harm thoughts at the end of group.     Plan: Pt will continue in PHP to increase ability to manage emotions and distress while decreasing depression and trauma symptoms.   Diagnosis: Bipolar 2 disorder, major depressive episode (HCC) [F31.81]    1. Bipolar 2 disorder, major depressive episode (HCC)       [name], LCSW 03/28/2017

## 2017-03-28 NOTE — Therapy (Signed)
[place], [place], [address] Phone: [phone]   Fax:  [phone]  Occupational Therapy Treatment and Discharge  Patient Details  Name: Stefanie Galvan MRN: [id number] Date of Birth: [birth year] Referring Provider: Dr. Donnelly Angelica  Encounter Date: 03/28/2017      OT End of Session - 03/28/17 1323    Visit Number 6   Number of Visits 6   Date for OT Re-Evaluation 03/29/17   Authorization Type [employer]   OT Start Time 1030   OT Stop Time 1130   OT Time Calculation (min) 60 min   Activity Tolerance Patient tolerated treatment well   Behavior During Therapy [facility] for tasks assessed/performed      Past Medical History:  Diagnosis Date  . Arthritis   . Depression 2.5 yrs ago   situational dep-no current tx/meds  . TBI (traumatic brain injury) ([place]) 11/10/2005    Past Surgical History:  Procedure Laterality Date  . BREAST REDUCTION SURGERY  02/25/2012   Procedure: MAMMARY REDUCTION  (BREAST);  Surgeon: Cristine Polio, MD;  Location: [place];  Service: Plastics;  Laterality: Bilateral;  bilateral reduction  . DILATION AND CURETTAGE OF UTERUS  2001  . [place]; 1996   X 2; bilateral  . TUBAL LIGATION  2003    There were no vitals filed for this visit.      Subjective Assessment - 03/28/17 1322    Currently in Pain? No/denies            [facility] OT Assessment - 03/28/17 1322      Assessment   Diagnosis Bipolar Disorder     Precautions   Precautions None       OT Treatment Session: Social and Communication Skills II  S:  "Eye contact can show if your self-esteem is low or high."   O:  Patient actively participated in the following skilled occupational therapy treatment session this date:             Social and communication skills: Pt participated in discussion of social and communication skills including the importance of  social skills in various settings. Pt completed self-esteem alphabet worksheet identifying good or positive qualities/characteristics about himself.  Also discussed variables that affect self-esteem. Pt then participated in activity-"Elephant in the Room" focusing on sharing a difficult or taboo subject or situation and working through a problem-solving process with a small group. Pt was asked to identify if the situation was one he/she could C, I, or A-control, influence, or accept. Pt then actively participated in discussion with group working through the 4 W's-why is this happening? What is being done about it? Who can resolve it? and When can it be resolved?   A:  Patient participated in skilled occupational therapy group for social and communication skills this date.  Patient was engaged and open to ideas and strategies introduced.     P:  Continue participation in skilled occupational therapy groups  1-2 times per week for 2 weeks in order to gain the necessary skills needed to return to full time community living and learn effective coping strategies to be a productive community resident. Next session: physical and mental health and wellness.          OT Short Term Goals - 03/28/17 1323      OT SHORT TERM GOAL #1   Title Patient will be educated on strategies to improve psychosocial skills needed to  participate fully in all daily, work, and leisure activities.   Time 3   Period Weeks   Status Partially Met     OT SHORT TERM GOAL #2   Title Patient will be educated on a HEP and independent with implementation of HEP.   Time 3   Period Weeks   Status Partially Met     OT SHORT TERM GOAL #3   Title Patient will independently apply psychosocial skills and coping mechanisms to her daily activities in order to function independently.   Time 3   Period Weeks   Status Partially Met                  Plan - 03/28/17 1323    Consulted and Agree with Plan of Care Patient       Patient will benefit from skilled therapeutic intervention in order to improve the following deficits and impairments:   (decreased coping skills, decreased psychosocial skills)  Visit Diagnosis: PTSD (post-traumatic stress disorder)  Bipolar disorder, current episode depressed, severe, without psychotic features [facility])    Problem List Patient Active Problem List   Diagnosis Date Noted  . Bipolar 1 disorder, depressed ([place]) 03/19/2017  . Bipolar 2 disorder, major depressive episode ([place]) 03/04/2017    Class: Chronic  . Chronic post-traumatic stress disorder (PTSD) 03/04/2017    Class: Chronic   Guadelupe Sabin, OTR/L  [phone] 03/28/2017, 1:23 PM  [place], [place], [address] Phone: [phone]   Fax:  [phone]  Name: Stefanie Galvan MRN: [id number] Date of Birth: 1966/05/17    OCCUPATIONAL THERAPY DISCHARGE SUMMARY  Visits from Start of Care: 6  Current functional level related to goals / functional outcomes: Pt has actively participated in OT groups including time management, 2 stress management, 2 communication and social skills groups, job readiness. Pt is discharging from PHP.    Remaining deficits: Pt continues to have difficulty implementing coping strategies at times.    Education / Equipment: Pt educated on strategies and techniques for the above-mentioned areas.  Plan: Patient agrees to discharge.  Patient goals were partially met. Patient is being discharged due to being pleased with the current functional level.  ?????

## 2017-03-28 NOTE — Psych (Signed)
   [name]  Stefanie Galvan [id number]  Session Time: 9 am - 2 pm  Participation Level: Active  Behavioral Response: CasualAlertEuthymic  Type of Therapy: Group Therapy; Psychotherapy,  Psychoeducation, Activity Therapy, Spiritual Care Group  Treatment Goals addressed: Coping  Interventions: CBT, DBT, Supportive and Reframing  Summary:  9:00 - 10:30 Clinician led check-in regarding current stressors and situation, and review of patient completed daily inventory. Clinician utilized active listening and empathetic response and validated patient emotions. Clinician facilitated processing group on pertinent issues.  10:30 -12:00 Spiritual care group  12:00 - 12:45 Reflection group: Patients encouraged to practice skills and interpersonal techniques or work on mindfulness and relaxation techniques. The importance of self-care and making skills part of a routine to increase usage were stressed.  12:45 - 1:50 Clinician introduced topic of "Feelings and Emotions". Patients discussed how hard emotions and feelings can be to identify, accept, and handle. Clinician led feelings [name] game to aid in identification of feelings.  1:50 - 2:00 Clinician led check-out. Clinician assessed for immediate needs, medication compliance and efficacy, and safety concerns.    Suicidal/Homicidal: Nowithout intent/plan  Therapist Response: Stefanie Galvan is a 51 y.o. female who presents with bipolar and trauma symptoms. Patient arrived within time allowed and reports she is feeling "good." Patient rates her mood at a 9 on a 1-10 scale with 10 being great. Patient reports that she had a really good past couple of days. 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 Patient engaged in activity and discussion. Patient demonstrates some progress as evidenced by sharing with the group how she  does not let negative thoughts and comments affect her. Patient stated that she puts the negative thoughts in an "imaginary jar" and puts them away on a self. Patient denies SI/HI/self-harm at the end of group.  Plan: Pt will continue in PHP to increase ability to manage emotions and distress while decreasing depression and trauma symptoms.   Diagnosis: Bipolar 2 disorder, major depressive episode (HCC) [F31.81]    1. Bipolar 2 disorder, major depressive episode (HCC)   2. PTSD (post-traumatic stress disorder)     [name], LPCA 03/28/2017

## 2017-03-29 ENCOUNTER — Other Ambulatory Visit (HOSPITAL_COMMUNITY): Payer: PRIVATE HEALTH INSURANCE | Admitting: Professional

## 2017-03-29 VITALS — BP 108/70 | HR 70 | Ht 68.0 in | Wt 217.0 lb

## 2017-03-29 DIAGNOSIS — F3181 Bipolar II disorder: Secondary | ICD-10-CM

## 2017-03-29 DIAGNOSIS — F329 Major depressive disorder, single episode, unspecified: Secondary | ICD-10-CM | POA: Diagnosis not present

## 2017-03-29 NOTE — Psych (Signed)
   [facility] BH PHP THERAPIST PROGRESS NOTE  [name] [id number]  Session Time: 9 am - 2 pm  Participation Level: Active  Behavioral Response: CasualAlertEuthymic  Type of Therapy: Group Therapy; Psychotherapy,  Psychoeducation, Activity Therapy, OT  Treatment Goals addressed: Coping  Interventions: CBT, DBT, Supportive and Reframing  Summary:  9:00 - 10:30 Clinician led check-in regarding current stressors and situation, and review of patient completed daily inventory. Clinician utilized active listening and empathetic response and validated patient emotions. Clinician facilitated processing group on pertinent issues.  10:30 -11:30 OT  11:30 - 12:00 Clinician continued discussion on relationships and group discussed how to make and maintain friendships.  12:00 - 12:45 Reflection group: Patients encouraged to practice skills and interpersonal techniques or work on mindfulness and relaxation techniques. The importance of self-care and making skills part of a routine to increase usage when stressed.  12:45 - 1:50 Clinician introduced "Mindfulness". Clinician showed TedTalk on mindfulness and the group discussed. Patients identified what types of activities would help them practice mindfulness.  1:50 - 2:00 Clinician led check-out. Clinician assessed for immediate needs, medication compliance and efficacy, and safety concerns.     Suicidal/Homicidal: Nowithout intent/plan  Therapist Response: Stefanie Galvan is a 51 y.o. female who presents with bipolar and trauma symptoms. Patient arrived within time allowed and reports she is feeling "rested and good." Patient rates her mood at a 9 on a 1-10 scale with 10 being great. Patient reports that she had a relaxing afternoon. Patient shared that she window shopped after leaving group and napped for 3.5 hours once she got home. Patient also stated that she got her work schedule for when she returns to work next week. Patient reported  that she was satisfied with her new schedule. Patient engaged in activity and discussion. Patient demonstrates some progress as evidenced by sharing with other group members how she practices mindfulness techniques daily to help relieve her anxiety. Patient denies SI/HI/self-harm at the end of group.   Plan: Pt will continue in PHP to increase ability to manage emotions and distress while decreasing depression and trauma symptoms.   Diagnosis: Bipolar 2 disorder, major depressive episode (HCC) [F31.81]    1. Bipolar 2 disorder, major depressive episode (HCC)     [name], LCSW 03/29/2017

## 2017-03-29 NOTE — Progress Notes (Signed)
Patient presented with appropriate affect, level mood and denied any current or recent suicidal or homicidal ideations, no attempts to harm self and no auditory or visual hallucinations.  Patient reported not feeling as well physically today, states she thinks she is getting a little bit of a head cold but that she is not depressed.  Patient rated her depression a 0, anxiety a 2-3, and hopelessness a 0 today on a scale of 0-10 with 0 being none and 10 the worst she could experience.  Patient reported she was sleeping 11 hours a night now and having vivid dreams often.  Denies that these are frightening but this nurse informed this may be due to Trazodone.  Patient stated she may try cutting it back a little to see if stopped but denies as a problem at this time.  Patient admitted she is a little anxious about plan to transition to IOP in the coming week and to start back work part-time.  Discussed need to use coping skills learned to help with times of difficulty at work and patient discussed several that have helped and she would try.  Patient discussed her relationship with her Mother and children some and steps she has taken to improve these and will continue to try to be open with them to help her as well.  Patient denied any current concerns with medications and discussed diet and exercise to help manage weight as has gained 2 pounds since 03/01/17.  Patient stable today and to call this nurse or follow up with Dr. Ladona Ridgel, once in IOP in the coming week if has any increased symptoms or concerns with transition or with returning to work part-time.  Patient agreed with plan and returned to [facility] group to finish out her last day this date without any noted concerns.

## 2017-04-01 ENCOUNTER — Encounter (HOSPITAL_COMMUNITY): Payer: Self-pay | Admitting: Psychiatry

## 2017-04-01 ENCOUNTER — Other Ambulatory Visit (HOSPITAL_COMMUNITY): Payer: PRIVATE HEALTH INSURANCE | Admitting: Psychiatry

## 2017-04-01 ENCOUNTER — Other Ambulatory Visit (HOSPITAL_COMMUNITY): Payer: Self-pay

## 2017-04-01 DIAGNOSIS — F3181 Bipolar II disorder: Secondary | ICD-10-CM

## 2017-04-01 DIAGNOSIS — F329 Major depressive disorder, single episode, unspecified: Secondary | ICD-10-CM | POA: Diagnosis not present

## 2017-04-01 NOTE — Progress Notes (Signed)
Patient ID: Stefanie Galvan, female   DOB: [birth year], 51 y.o.   MRN: [id number]   Transfer note      Ms Hesch is being transferred from the partial hospital program to the intensive outpatient program.  She is anxious and irritable today in anticipation of returning to work part time this afternoon.  She suspects she will be fine after reporting to work but in the meantime she worries she may slip back.      Diagnosis remains bipolar 2 depressed.  She has also been diagnosed as bipolar 1 as well in the recent past     meds remain escitalopram 10 mg daily, lithium 300 mg tid, trazodone 100 mg hs and hydroxyzine 25 mg tid as needed     She will stay in this program until she has made a transition back to work without regressing to depression and suicidal thinking

## 2017-04-01 NOTE — Psych (Signed)
   [facility] PHP THERAPIST PROGRESS NOTE  MARLENA BARBATO [id number]  Session Time: 9 am - 1 pm  Participation Level: Active  Behavioral Response: CasualAlertEuthymic  Type of Therapy: Group Therapy; Psychotherapy,  Psychoeducation, Activity Therapy  Treatment Goals addressed: Coping  Interventions: CBT, DBT, Supportive and Reframing  Summary:  9:00 - 10:30: Clinician led check-in regarding current stressors and situation, and review of patient completed daily inventory. Clinician utilized active listening and empathetic response and validated patient emotions. Clinician facilitated processing group on pertinent issues.  10:30 -12:00: Clinician continued topic of "Feelings and Emotions". Clincian led psychotherapy on how to contextualize emotions and not to attach them when they are unfounded. Cln reviewed multiple metaphors that may help pt remember this concept.  12:00-12:50: Group viewed TED talk "100 days of rejection" and discussed how "icky" feelings can aid in personal growth.  12:50 - 1:00: Clinician led check-out. Clinician assessed for immediate needs, medication compliance and efficacy, and safety concerns.    Suicidal/Homicidal: Nowithout intent/plan  Therapist Response: Stefanie Galvan is a 51 y.o. female who presents with bipolar and trauma symptoms. Patient arrived within time allowed and reports she "doesn't feel horrible." Patient rates her mood at a 7 on a 1-10 scale with 10 being great. Patient reports that she is nervous about starting work on Monday. Patient shares that she sat outside for an hour and was mindful about the rain and wind blowing and hitting the side of her house. Patient engaged in activity and discussion. Patient demonstrates some progress as evidenced by stating she is going to continue to use "and" statements concerning her feelings about going back to work Monday. Patient stated that she is anxious about going back to work and she knows she  can do it. Patient denies SI/HI/self-harm at the end of group.   Plan: Pt will continue discharge from PHP due to meeting treatment goals of decreased depression and anxiety symptoms, increased ability to self manage symptoms. Pt will step down to IOP within this agency. Pt and psychiatrist are aligned with this plan. Pt will begin IOP on 10/15. Pt denies SI/HI at time of discharge.   Diagnosis: Bipolar 2 disorder, major depressive episode (HCC) [F31.81]    1. Bipolar 2 disorder, major depressive episode (HCC)     [name], LCSW 04/01/2017
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
## 2017-04-01 NOTE — Progress Notes (Signed)
    Daily Group Progress Note  Program: IOP  Group Time: 9:00-12:00   Participation Level: Active   Behavioral Response: Appropriate   Type of Therapy:  Group Therapy   Summary of Progress: Pt presented as engaged.  Pt listened to and engaged in presentation from pharmacist about medications.  Pt said that she struggles with self-acceptance and had a suicide attempt a couple months ago.  Pt related to other group members and said she has always struggled with body image, even when she was very thin.  Pt said that she is irritable today because she is returning to work after a month and a half of being gone.  Pt said that her job is stressful because if people don't show for their appointments, she doesn't make money.     Shaune Pollack, LPC

## 2017-04-02 ENCOUNTER — Telehealth (HOSPITAL_COMMUNITY): Payer: Self-pay | Admitting: Psychiatry

## 2017-04-02 ENCOUNTER — Other Ambulatory Visit (HOSPITAL_COMMUNITY): Payer: Self-pay

## 2017-04-02 ENCOUNTER — Other Ambulatory Visit (HOSPITAL_COMMUNITY): Payer: PRIVATE HEALTH INSURANCE | Admitting: Psychiatry

## 2017-04-03 ENCOUNTER — Other Ambulatory Visit (HOSPITAL_COMMUNITY): Payer: PRIVATE HEALTH INSURANCE | Admitting: Psychiatry

## 2017-04-03 ENCOUNTER — Other Ambulatory Visit (HOSPITAL_COMMUNITY): Payer: Self-pay

## 2017-04-03 DIAGNOSIS — F329 Major depressive disorder, single episode, unspecified: Secondary | ICD-10-CM | POA: Diagnosis not present

## 2017-04-03 DIAGNOSIS — F3181 Bipolar II disorder: Secondary | ICD-10-CM

## 2017-04-04 ENCOUNTER — Other Ambulatory Visit (HOSPITAL_COMMUNITY): Payer: Self-pay

## 2017-04-04 ENCOUNTER — Other Ambulatory Visit (HOSPITAL_COMMUNITY): Payer: PRIVATE HEALTH INSURANCE | Admitting: Psychiatry

## 2017-04-04 DIAGNOSIS — F3181 Bipolar II disorder: Secondary | ICD-10-CM

## 2017-04-04 DIAGNOSIS — F329 Major depressive disorder, single episode, unspecified: Secondary | ICD-10-CM | POA: Diagnosis not present

## 2017-04-04 NOTE — Progress Notes (Signed)
    Daily Group Progress Note  Program: IOP  Group Time: 9:00-12:00  Participation Level: Active  Behavioral Response: Appropriate  Type of Therapy:  Group Therapy  Summary of Progress: Pt. Presents with bright affect, talkative, engaged in the group process. Pt. Shared grounding intervention with another Pt. Who was struggling with anxiety. Pt. Shared a reading about forgiveness that was meaningful to her picking up from yesterday's group. Pt. Shared with another group member her thoughts about "taking one day at a time" and not thinking too far ahead about her recovery process in order to help manage her longterm recovery process. Pt. Participated in guided meditation, breathwork, and bodyscan with the counselor. Pt. Participated in yoga therapy with Forde RadonLeanne Yates, LPC.       Shaune PollackBrown, Jennifer B, LPC

## 2017-04-05 ENCOUNTER — Other Ambulatory Visit (HOSPITAL_COMMUNITY): Payer: Self-pay

## 2017-04-05 ENCOUNTER — Other Ambulatory Visit (HOSPITAL_COMMUNITY): Payer: PRIVATE HEALTH INSURANCE | Admitting: Psychiatry

## 2017-04-05 DIAGNOSIS — F329 Major depressive disorder, single episode, unspecified: Secondary | ICD-10-CM | POA: Diagnosis not present

## 2017-04-05 DIAGNOSIS — F3181 Bipolar II disorder: Secondary | ICD-10-CM

## 2017-04-08 ENCOUNTER — Other Ambulatory Visit (HOSPITAL_COMMUNITY): Payer: PRIVATE HEALTH INSURANCE

## 2017-04-08 ENCOUNTER — Other Ambulatory Visit (HOSPITAL_COMMUNITY): Payer: Self-pay

## 2017-04-08 NOTE — Progress Notes (Signed)
    Daily Group Progress Note  Program: IOP  Group Time: 9:00-12:00   Participation Level: Active   Behavioral Response: Appropriate   Type of Therapy:  Group Therapy   Summary of Progress: Pt presented as engaged.  Pt apologized to the group for lying when she first came in-pt said when asked why she was not in group the previous day, she said that she wasn't feeling well, which was only partially true.  Pt said she did not get out of bed the day before and was just feeling numbness.  Counselor and group members affirmed pt's honesty.  Pt expressed her idea of healthy vs. unhealthy expectations and shared a helpful analogy with the group.  Pt participated in activity about forgiveness and chose quotes that particularly stood out to her.  Pt participated in guided meditation.  Shaune PollackBrown, Maday Guarino B, LPC
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
## 2017-04-09 ENCOUNTER — Other Ambulatory Visit (HOSPITAL_COMMUNITY): Payer: Self-pay

## 2017-04-09 ENCOUNTER — Other Ambulatory Visit (HOSPITAL_COMMUNITY): Payer: PRIVATE HEALTH INSURANCE | Admitting: Psychiatry

## 2017-04-09 DIAGNOSIS — F3181 Bipolar II disorder: Secondary | ICD-10-CM

## 2017-04-09 DIAGNOSIS — F329 Major depressive disorder, single episode, unspecified: Secondary | ICD-10-CM | POA: Diagnosis not present

## 2017-04-10 ENCOUNTER — Other Ambulatory Visit (HOSPITAL_COMMUNITY): Payer: Self-pay

## 2017-04-10 ENCOUNTER — Other Ambulatory Visit (HOSPITAL_COMMUNITY): Payer: PRIVATE HEALTH INSURANCE | Admitting: Psychiatry

## 2017-04-10 DIAGNOSIS — F3181 Bipolar II disorder: Secondary | ICD-10-CM

## 2017-04-10 DIAGNOSIS — F329 Major depressive disorder, single episode, unspecified: Secondary | ICD-10-CM | POA: Diagnosis not present

## 2017-04-11 ENCOUNTER — Other Ambulatory Visit (HOSPITAL_COMMUNITY): Payer: Self-pay

## 2017-04-11 ENCOUNTER — Other Ambulatory Visit (HOSPITAL_COMMUNITY): Payer: PRIVATE HEALTH INSURANCE

## 2017-04-12 ENCOUNTER — Other Ambulatory Visit (HOSPITAL_COMMUNITY): Payer: PRIVATE HEALTH INSURANCE | Admitting: Psychiatry

## 2017-04-12 ENCOUNTER — Other Ambulatory Visit (HOSPITAL_COMMUNITY): Payer: Self-pay

## 2017-04-12 DIAGNOSIS — F3181 Bipolar II disorder: Secondary | ICD-10-CM

## 2017-04-12 DIAGNOSIS — F329 Major depressive disorder, single episode, unspecified: Secondary | ICD-10-CM | POA: Diagnosis not present

## 2017-04-12 NOTE — Patient Instructions (Signed)
D:  Pt successfully completed MH-IOP today.  A:  Discharge today.  Follow up with Dr. [name] on 04-15-17 @ 7 a.m and Boneta LucksJennifer Brown, PhD on 04-17-17 @ 2:30 pm.  Encouraged support groups.  Return to work full-time without any restrictions on 04-18-17.  R:  Pt receptive.

## 2017-04-12 NOTE — Progress Notes (Signed)
  [facility] Int[facility]patient [facility] Discharge Summary  [name] [id number]  Admission date: 03/26/2017. Discharge date: 04/12/2017  Reason for admission: patient was readmitted to IOP from inpatient services.  Patient was admitted to inpatient services due to worsening of depression and having suicidal thoughts.  Patient has history of bipolar disorder and chronic PTSD.  [facility] and also [facility] and [facility].  Please see admission assessment note for more details.  Chemical Use History: patient denies any drug use.  She's been taking lithium, trazodone, Vistariland Lexapro.  Family of Origin Issues: reviewed.  Progress in Program Toward Treatment Goals: patient did participate in intensive outpatient program.  She is feeling much better.  She attained all her goals.  She sleeping better and reported no side effects medication  Progress (rationale): overall patient improved and achieved her goal.  She is living at her mother's basement for past one year.  She is currently separated and she is relieved that she is no longer with her husband.  Patient acknowledged her mental illness which includes bipolar disorder and PTSD.  Currently she has no mania, psychosis, hallucination, suicidal thoughts.  Her sleep is improved.  She will follow-up with this writer next week and also see therapist for individual counseling.  Patient feels good and ready for discharge from the program.  She will continue her current psychiatric medications which are Lexapro 10 mg daily, hydroxyzine 25 mg as needed, lithium 300 mg 3 times a day and trazodone 100 mg at bedtime.    [facility] 04/12/2017
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
## 2017-04-15 ENCOUNTER — Other Ambulatory Visit (HOSPITAL_COMMUNITY): Payer: PRIVATE HEALTH INSURANCE

## 2017-04-15 ENCOUNTER — Encounter (HOSPITAL_COMMUNITY): Payer: Self-pay | Admitting: Psychiatry

## 2017-04-15 ENCOUNTER — Ambulatory Visit (INDEPENDENT_AMBULATORY_CARE_PROVIDER_SITE_OTHER): Payer: PRIVATE HEALTH INSURANCE | Admitting: Psychiatry

## 2017-04-15 VITALS — BP 128/74 | HR 64 | Ht 68.0 in | Wt 212.0 lb

## 2017-04-15 DIAGNOSIS — Z91411 Personal history of adult psychological abuse: Secondary | ICD-10-CM | POA: Diagnosis not present

## 2017-04-15 DIAGNOSIS — R4582 Worries: Secondary | ICD-10-CM

## 2017-04-15 DIAGNOSIS — Z635 Disruption of family by separation and divorce: Secondary | ICD-10-CM

## 2017-04-15 DIAGNOSIS — Z818 Family history of other mental and behavioral disorders: Secondary | ICD-10-CM | POA: Diagnosis not present

## 2017-04-15 DIAGNOSIS — R4587 Impulsiveness: Secondary | ICD-10-CM

## 2017-04-15 DIAGNOSIS — Z62810 Personal history of physical and sexual abuse in childhood: Secondary | ICD-10-CM

## 2017-04-15 DIAGNOSIS — R454 Irritability and anger: Secondary | ICD-10-CM | POA: Diagnosis not present

## 2017-04-15 DIAGNOSIS — F4310 Post-traumatic stress disorder, unspecified: Secondary | ICD-10-CM

## 2017-04-15 DIAGNOSIS — F3131 Bipolar disorder, current episode depressed, mild: Secondary | ICD-10-CM | POA: Diagnosis not present

## 2017-04-15 MED ORDER — TRAZODONE HCL 100 MG PO TABS: 100.0000 mg | ORAL_TABLET | Freq: Every day | ORAL | 1 refills | Status: DC

## 2017-04-15 MED ORDER — ESCITALOPRAM OXALATE 10 MG PO TABS: 10.0000 mg | ORAL_TABLET | Freq: Every day | ORAL | 1 refills | Status: DC

## 2017-04-15 MED ORDER — HYDROXYZINE HCL 25 MG PO TABS: ORAL_TABLET | ORAL | 1 refills | Status: DC

## 2017-04-15 MED ORDER — LITHIUM CARBONATE 300 MG PO CAPS: 300.0000 mg | ORAL_CAPSULE | Freq: Three times a day (TID) | ORAL | 1 refills | Status: DC
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
## 2017-04-16 ENCOUNTER — Other Ambulatory Visit (HOSPITAL_COMMUNITY): Payer: PRIVATE HEALTH INSURANCE

## 2017-04-17 ENCOUNTER — Ambulatory Visit (INDEPENDENT_AMBULATORY_CARE_PROVIDER_SITE_OTHER): Payer: PRIVATE HEALTH INSURANCE | Admitting: Psychiatry

## 2017-04-17 ENCOUNTER — Other Ambulatory Visit (HOSPITAL_COMMUNITY): Payer: PRIVATE HEALTH INSURANCE

## 2017-04-17 DIAGNOSIS — F4310 Post-traumatic stress disorder, unspecified: Secondary | ICD-10-CM

## 2017-04-17 DIAGNOSIS — F3131 Bipolar disorder, current episode depressed, mild: Secondary | ICD-10-CM | POA: Diagnosis not present
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
## 2017-04-18 ENCOUNTER — Other Ambulatory Visit (HOSPITAL_COMMUNITY): Payer: PRIVATE HEALTH INSURANCE

## 2017-04-19 ENCOUNTER — Other Ambulatory Visit (HOSPITAL_COMMUNITY): Payer: PRIVATE HEALTH INSURANCE

## 2017-04-22 ENCOUNTER — Other Ambulatory Visit (HOSPITAL_COMMUNITY): Payer: PRIVATE HEALTH INSURANCE

## 2017-04-23 ENCOUNTER — Other Ambulatory Visit (HOSPITAL_COMMUNITY): Payer: PRIVATE HEALTH INSURANCE

## 2017-04-24 ENCOUNTER — Other Ambulatory Visit (HOSPITAL_COMMUNITY): Payer: PRIVATE HEALTH INSURANCE

## 2017-04-25 ENCOUNTER — Other Ambulatory Visit (HOSPITAL_COMMUNITY): Payer: PRIVATE HEALTH INSURANCE

## 2017-04-26 ENCOUNTER — Other Ambulatory Visit (HOSPITAL_COMMUNITY): Payer: PRIVATE HEALTH INSURANCE

## 2017-04-29 ENCOUNTER — Other Ambulatory Visit (HOSPITAL_COMMUNITY): Payer: PRIVATE HEALTH INSURANCE

## 2017-04-29 ENCOUNTER — Ambulatory Visit (INDEPENDENT_AMBULATORY_CARE_PROVIDER_SITE_OTHER): Payer: PRIVATE HEALTH INSURANCE | Admitting: Psychiatry

## 2017-04-29 DIAGNOSIS — F3131 Bipolar disorder, current episode depressed, mild: Secondary | ICD-10-CM

## 2017-04-30 ENCOUNTER — Other Ambulatory Visit (HOSPITAL_COMMUNITY): Payer: PRIVATE HEALTH INSURANCE

## 2017-05-01 ENCOUNTER — Other Ambulatory Visit (HOSPITAL_COMMUNITY): Payer: PRIVATE HEALTH INSURANCE
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
## 2017-05-02 ENCOUNTER — Other Ambulatory Visit (HOSPITAL_COMMUNITY): Payer: PRIVATE HEALTH INSURANCE

## 2017-05-03 ENCOUNTER — Other Ambulatory Visit (HOSPITAL_COMMUNITY): Payer: PRIVATE HEALTH INSURANCE

## 2017-05-06 ENCOUNTER — Other Ambulatory Visit (HOSPITAL_COMMUNITY): Payer: PRIVATE HEALTH INSURANCE

## 2017-05-07 ENCOUNTER — Other Ambulatory Visit (HOSPITAL_COMMUNITY): Payer: PRIVATE HEALTH INSURANCE

## 2017-05-08 ENCOUNTER — Other Ambulatory Visit (HOSPITAL_COMMUNITY): Payer: PRIVATE HEALTH INSURANCE

## 2017-05-09 ENCOUNTER — Ambulatory Visit (HOSPITAL_COMMUNITY): Payer: Self-pay | Admitting: Psychiatry

## 2017-05-10 ENCOUNTER — Other Ambulatory Visit (HOSPITAL_COMMUNITY): Payer: PRIVATE HEALTH INSURANCE

## 2017-05-13 ENCOUNTER — Other Ambulatory Visit (HOSPITAL_COMMUNITY): Payer: PRIVATE HEALTH INSURANCE

## 2017-05-14 ENCOUNTER — Other Ambulatory Visit (HOSPITAL_COMMUNITY): Payer: PRIVATE HEALTH INSURANCE

## 2017-05-14 ENCOUNTER — Ambulatory Visit (INDEPENDENT_AMBULATORY_CARE_PROVIDER_SITE_OTHER): Payer: PRIVATE HEALTH INSURANCE | Admitting: Psychiatry

## 2017-05-14 DIAGNOSIS — F319 Bipolar disorder, unspecified: Secondary | ICD-10-CM

## 2017-05-14 DIAGNOSIS — F3131 Bipolar disorder, current episode depressed, mild: Secondary | ICD-10-CM

## 2017-05-14 NOTE — Addendum Note (Signed)
Addended by: Boneta LucksBROWN, JENNIFER B on: 05/14/2017 04:35 PM   Modules accepted: Level of Service

## 2017-05-15 ENCOUNTER — Other Ambulatory Visit (HOSPITAL_COMMUNITY): Payer: PRIVATE HEALTH INSURANCE

## 2017-05-16 ENCOUNTER — Other Ambulatory Visit (HOSPITAL_COMMUNITY): Payer: PRIVATE HEALTH INSURANCE

## 2017-05-17 ENCOUNTER — Other Ambulatory Visit (HOSPITAL_COMMUNITY): Payer: PRIVATE HEALTH INSURANCE

## 2017-05-17 NOTE — Progress Notes (Signed)
   THERAPIST PROGRESS NOTE    Session Time:1:05-2:00  Participation Level:Active  Behavioral Response:CasualAlertEuthymic  Type of Therapy:Individual Therapy  Treatment Goals addressed:Coping  Interventions:CBT and Supportive  Summary:Stefanie D Lineberryis a 51 y.o.femalewho presents with MDD.   Suicidal/Homicidal:Nowithout intent/plan  Therapist Response: Pt. Presents with euthymic mood. Pt. Discussed that things continued to go well for her at work. Pt. Discussed that work was particularly slow for her this week and was unexpected given the holiday weekend. Despite not making expected pay, Pt. Was very proud of herself for meeting her financial goals and becoming less financially dependent on her mother and step-father. Pt. Discussed that her Thanksgiving was very good with her family. Pt. Discussed pride in her children for taking an active role in preparing dishes and bringing them to her mother's home as she was not able to cook this year. 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 Pt. Discussed her desire for a healthy romantic relationship and perceived barriers to having a good relationship. Pt discussed recent relationship interest, that turned out to be more of a good friend, and has helped her to clarify her values and needs in a long-term partner. Pt. Discussed that she has not actively pursued interests, hobbies outside of work and family and that this would be important for her to find a significant other with similar interests and values.  Plan: Return again in2weeks, Continue with CBT based treatment  Diagnosis:Bipolar Affective Disorder        Shaune PollackBrown, Jennifer B, [facility] 05/17/2017

## 2017-05-20 ENCOUNTER — Other Ambulatory Visit (HOSPITAL_COMMUNITY): Payer: PRIVATE HEALTH INSURANCE

## 2017-05-21 ENCOUNTER — Other Ambulatory Visit (HOSPITAL_COMMUNITY): Payer: PRIVATE HEALTH INSURANCE

## 2017-05-22 ENCOUNTER — Other Ambulatory Visit (HOSPITAL_COMMUNITY): Payer: PRIVATE HEALTH INSURANCE

## 2017-05-23 ENCOUNTER — Other Ambulatory Visit (HOSPITAL_COMMUNITY): Payer: PRIVATE HEALTH INSURANCE

## 2017-05-24 ENCOUNTER — Other Ambulatory Visit (HOSPITAL_COMMUNITY): Payer: PRIVATE HEALTH INSURANCE

## 2017-05-27 ENCOUNTER — Ambulatory Visit (HOSPITAL_COMMUNITY): Payer: Self-pay | Admitting: Psychiatry

## 2017-05-28 ENCOUNTER — Ambulatory Visit (INDEPENDENT_AMBULATORY_CARE_PROVIDER_SITE_OTHER): Payer: PRIVATE HEALTH INSURANCE | Admitting: Psychiatry

## 2017-05-28 DIAGNOSIS — F3131 Bipolar disorder, current episode depressed, mild: Secondary | ICD-10-CM

## 2017-05-29 NOTE — Progress Notes (Signed)
THERAPIST PROGRESS NOTE  Session Time:1:15-2:00  Participation Level:Active  Behavioral Response:CasualAlertEuthymic  Type of Therapy:Individual Therapy  Treatment Goals addressed:Coping  Interventions:CBT and Supportive  Summary:Stefanie D Lineberryis a 51 y.o.femalewho presents with MDD.   Suicidal/Homicidal:Nowithout intent/plan  Therapist Response:Pt. Continues to present with euthymic mood. Pt. Colored hair blonde since last session. First few minutes discussed the emotional significance of the change for her. Pt. Discussed that when her hair was dark she was "obviously unsure" of herself and as a blond she felt "Secretly spicy" indicating that she was on a journey of self discovery, becoming more secure in herself and self-confident. Pt. Discussed a recent panic attack at work that felt somewhat like mania. Pt. Discussed that she was able to self-soothe during the attack by using the mindfulness sensory awareness skills, focusing on nature, how the wind felt on her skin, etc. that she learned in the [facility] program. Pt. Was affirmed for the use of these skills and her self-awareness that something was not right with her and that she needed to pause and exercise self-care. Pt. Discussed growing awareness in her relationship with her mother. Pt. Discussed ongoing issue of her mother's judgment of her that was triggered by her hair color change an makeup, which Pt. Is realizing has less to do with her and more to do with Pt.'s mother's view's about herself. Pt. Is recognizing growing sense of self for expanding creativity and meeting financial goals and independence from her mother and step-father. Pt. Also met goal of going out socially with her work friends. Pt. Was genuinely happy and proud of herself for going out with her friends and looking forward to going out again. Counselor congratulate Pt. Progress on her goals and growing sense of self-confidence.  Plan: Return  again in2weeks, Continue with CBT based treatment  Diagnosis:Bipolar Affective Disorder       Nancie Neas, [facility] 05/29/2017

## 2017-05-30 ENCOUNTER — Ambulatory Visit (INDEPENDENT_AMBULATORY_CARE_PROVIDER_SITE_OTHER): Payer: PRIVATE HEALTH INSURANCE | Admitting: Psychiatry

## 2017-05-30 ENCOUNTER — Encounter (HOSPITAL_COMMUNITY): Payer: Self-pay | Admitting: Psychiatry

## 2017-05-30 DIAGNOSIS — Z818 Family history of other mental and behavioral disorders: Secondary | ICD-10-CM | POA: Diagnosis not present

## 2017-05-30 DIAGNOSIS — F3131 Bipolar disorder, current episode depressed, mild: Secondary | ICD-10-CM

## 2017-05-30 DIAGNOSIS — F4310 Post-traumatic stress disorder, unspecified: Secondary | ICD-10-CM | POA: Diagnosis not present

## 2017-05-30 MED ORDER — LITHIUM CARBONATE 300 MG PO CAPS: 300.0000 mg | ORAL_CAPSULE | Freq: Three times a day (TID) | ORAL | 2 refills | Status: DC

## 2017-05-30 MED ORDER — HYDROXYZINE HCL 25 MG PO TABS: ORAL_TABLET | ORAL | 1 refills | Status: DC

## 2017-05-30 MED ORDER — ESCITALOPRAM OXALATE 10 MG PO TABS: 10.0000 mg | ORAL_TABLET | Freq: Every day | ORAL | 2 refills | Status: DC

## 2017-05-30 MED ORDER — TRAZODONE HCL 100 MG PO TABS: ORAL_TABLET | ORAL | 1 refills | Status: DC
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
## 2017-06-25 ENCOUNTER — Ambulatory Visit (INDEPENDENT_AMBULATORY_CARE_PROVIDER_SITE_OTHER): Payer: PRIVATE HEALTH INSURANCE | Admitting: Psychiatry

## 2017-06-25 DIAGNOSIS — F3131 Bipolar disorder, current episode depressed, mild: Secondary | ICD-10-CM

## 2017-06-28 NOTE — Progress Notes (Signed)
   THERAPIST PROGRESS NOTE    Session Time:1:10-2:00  Participation Level:Active  Behavioral Response:CasualAlertEuthymic  Type of Therapy:Individual Therapy  Treatment Goals addressed:Coping  Interventions:CBT and Supportive  Summary:Stefanie D Lineberryis a 52 y.o.femalewho presents with MDD.   Suicidal/Homicidal:Nowithout intent/plan  Therapist Response:Pt. Continues to present with euthymic mood. Pt. Reports that [name] was very good and that her relationship with her mother continues to improve. Pt. Reports that she is doing well, followed through with plan or mental health service project at work and excited about mental health advocacy. Pt. Also shared excitement about her son's engagement and her love for her son's fiance. Pt. Discussed relationship issue and decision to end the relationship because she recognized that her values are very inconsistent with the person. Pt. Weighed options of how to end the relationship while wanting to make the end clear and not causing harm to the person. Pt. Also discussed a work relationship with a co-worker who causes stress to her and other co-workers. Pt. Was able to develop empathy with this co-worker and develop awareness of how she can be more present for this person while reduce the stress that she causes her.  Plan: Return again in2weeks, Continue with CBT based treatment  Diagnosis:Bipolar Affective Disorder       Shaune PollackBrown, Jennifer B, [facility] 06/28/2017

## 2017-07-03 ENCOUNTER — Encounter (HOSPITAL_COMMUNITY): Payer: Self-pay | Admitting: Psychiatry

## 2017-07-03 ENCOUNTER — Ambulatory Visit (INDEPENDENT_AMBULATORY_CARE_PROVIDER_SITE_OTHER): Payer: PRIVATE HEALTH INSURANCE | Admitting: Psychiatry

## 2017-07-03 DIAGNOSIS — F3131 Bipolar disorder, current episode depressed, mild: Secondary | ICD-10-CM
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
## 2017-07-09 ENCOUNTER — Ambulatory Visit (INDEPENDENT_AMBULATORY_CARE_PROVIDER_SITE_OTHER): Payer: PRIVATE HEALTH INSURANCE | Admitting: Psychiatry

## 2017-07-09 DIAGNOSIS — F3131 Bipolar disorder, current episode depressed, mild: Secondary | ICD-10-CM

## 2017-07-16 NOTE — Progress Notes (Signed)
   THERAPIST PROGRESS NOTE  Session Time:1:10-1:40  Participation Level:Active  Behavioral Response:CasualAlertSad  Type of Therapy:Individual Therapy  Treatment Goals addressed:Coping  Interventions:CBT and Supportive  Summary:Stefanie D Lineberryis a 52 y.o.femalewho presents with Bipolar Disorder   Suicidal/Homicidal:Nowithout intent/plan  Therapist Response:Pt. Returned for follow-up appointment after previous week's incident at work. Pt. Presented as calm, talkative, good eye contact, reflective on the events of the previous week. Pt. Expressed relief that she was not fired from work. Pt. Discussed that she was called in by her manager and she communicated full responsibility for her behavior. Pt. Reported that as a result her manager was very happy and she was notified that she was being moved to another division that better suited her skills and would be less stress for her. Pt. Also acknowledged that she managed the fear of being fired by spending money. Pt. Is aware of the pattern and discussed that part of her therapy goals moving forward is learning how to sit with the discomfort of fear of the unknown without numbing with spending. The session was shortened to 30 minutes due to therapist becoming ill during the session.   Plan: Return again in2weeks, Continue with CBT based treatment  Diagnosis:Bipolar Affective Disorder   Shaune PollackBrown, Jennifer B, [facility] 07/16/2017

## 2017-07-23 ENCOUNTER — Ambulatory Visit (INDEPENDENT_AMBULATORY_CARE_PROVIDER_SITE_OTHER): Payer: PRIVATE HEALTH INSURANCE | Admitting: Psychiatry

## 2017-07-23 DIAGNOSIS — F3131 Bipolar disorder, current episode depressed, mild: Secondary | ICD-10-CM | POA: Diagnosis not present

## 2017-07-25 NOTE — Progress Notes (Signed)
   THERAPIST PROGRESS NOTE  Session Time:1:10-1:40  Participation Level:Active  Behavioral Response:CasualAlertEuthymic  Type of Therapy:Individual Therapy  Treatment Goals addressed:Coping  Interventions:CBT and Supportive  Summary:Stefanie D Lineberryis a 52 y.o.femalewho presents withBipolar Disorder  Suicidal/Homicidal:Nowithout intent/plan  Therapist Response:Pt. Presents with euthymic mood, talkative, engaged in therapeutic process. Pt. Discussed her trust and boundaries in current work relationships. Pt. Discussed the importance of "knowing how far to go" in work and personal relationships and that this has sometimes been difficult for her to navigate. Pt. Discussed that she has come to be known in the office as "the person with mental illness" which is at times a double edged sword because she does not want this label to be used against her even though she does not feel any shame about her mental health diagnosis. Pt. Discussed that her relationship with her mother has much improved and that she is very happy about the developments in this relationship. Pt. Attributes this to improvements in her ability to communicate with her mother, allowing herself to be more open to spending time with her mother, developing empathy towards her mother in therapy, and becoming less reactive her mother's comments to her. Pt. Discussed that she is currently working through difficult situation at work with co-workers who trigger her. Pt. Has been offered opportunity to be moved to another location. Pt. Has rejected the offer because she does not want to be known as "difficult". Pt. Indicated that she would prefer to work on her skills in dealing with difficult situations rather than needing to be moved when they arise.  Plan: Return again in2weeks, Continue with CBT based treatment  Diagnosis:Bipolar Affective Disorder  Shaune PollackBrown, Ishika Chesterfield B, [facility] 07/25/2017

## 2017-08-06 ENCOUNTER — Ambulatory Visit (INDEPENDENT_AMBULATORY_CARE_PROVIDER_SITE_OTHER): Payer: PRIVATE HEALTH INSURANCE | Admitting: Psychiatry

## 2017-08-06 DIAGNOSIS — F319 Bipolar disorder, unspecified: Secondary | ICD-10-CM

## 2017-08-06 DIAGNOSIS — F3131 Bipolar disorder, current episode depressed, mild: Secondary | ICD-10-CM

## 2017-08-09 NOTE — Progress Notes (Signed)
   THERAPIST PROGRESS NOTE   Session Time:1:05-2:00  Participation Level:Active  Behavioral Response:CasualAlertEuthymic  Type of Therapy:Individual Therapy  Treatment Goals addressed:Coping  Interventions:CBT and Supportive  Summary:Benelli D Lineberryis a 52 y.o.femalewho presents withBipolar Disorder  Suicidal/Homicidal:Nowithout intent/plan  Therapist Response:Pt. Continues to present with euthymic mood, talkative, engaged in therapeutic process. Pt. Reports that she continues to do well at work and is happy with new placement. Pt. Reports that her work relationships are improving and that she is finding opportunities to work on trust and sharing her "true self" safely with co-workers. Pt. Shared recent conversation with at-work counselor who recommended that she be careful about sharing her diagnosis. Pt. Rejected this advice and enthusiastically finds opportunities to share her diagnosis. Pt. Is not ashamed of her diagnosis, embraces the challenge of living with her diagnosis. Pt. Spent significant time in session distinguishing between the part of herself that is creative, talkative, open to others, sharing, sarcastic and her manic side that oversteps interpersonal boundaries. Pt. Discussed learning to distinguish between "healthy [name]" and "unhealthy Selena BattenKim" and learning to trust co-workers and friends to learn the difference through communicating with her. Pt. Shared that her relationship with her mother is steadily improving through her willingness to communicate and be less defensive. Pt. Shared that she is taking risk in reestablishing old relationship and she is happy about this. Significant time in session was spent discussion values in relationship, I.e., work ethic and personal strength.  Plan: Return again in2weeks, Continue with CBT based treatment  Diagnosis:Bipolar Affective Disorder  Shaune PollackBrown, Jennifer B, [facility] 08/09/2017

## 2017-08-20 ENCOUNTER — Ambulatory Visit (INDEPENDENT_AMBULATORY_CARE_PROVIDER_SITE_OTHER): Payer: PRIVATE HEALTH INSURANCE | Admitting: Psychiatry

## 2017-08-20 DIAGNOSIS — F319 Bipolar disorder, unspecified: Secondary | ICD-10-CM | POA: Diagnosis not present

## 2017-08-20 DIAGNOSIS — F3131 Bipolar disorder, current episode depressed, mild: Secondary | ICD-10-CM

## 2017-08-27 ENCOUNTER — Ambulatory Visit (HOSPITAL_COMMUNITY): Payer: Self-pay | Admitting: Psychiatry

## 2017-08-27 NOTE — Progress Notes (Signed)
   THERAPIST PROGRESS NOTE   Session Time:2:10-3:00  Participation Level:Active  Behavioral Response:CasualAlertEuthymic  Type of Therapy:Individual Therapy  Treatment Goals addressed:Coping; Relationship boundaries  Interventions:CBT and Supportive  Summary:Stefanie D Lineberryis a 52 y.o.femalewho presents withBipolar Disorder  Suicidal/Homicidal:Nowithout intent/plan  Therapist Response:Pt. Continues to present with euthymic mood, talkative, engaged in therapeutic process.  Pt. Continues to perform well at work. Pt. Also discussed that she continues to make improvements in her relationship with her mother which she attributes to continuing to work on communication with her mother and being less defensive in her interactions with her mother. Significant time in session was spent processing Pt.'s decision to visit former boyfriend in [place]. Pt. Discussed her excitement about the trip and her expectations for the relationship. Pt. Discussed her relationship history which was complicated by his infidelity and ongoing feelings of guilt on her part. Pt. Discussed her plans to stay with her friend during the trip. Therapist discussed the importance of setting realistic expectations and establishing healthy boundaries before leaving in order to avoid problems with reestablishing the relationship. Pt. Discussed what she values in a relationship (I.e., kindness, commitment, communication, strong work ethic) in order to inform her personal boundaries.  Plan: Return again in2weeks, Continue with CBT based treatment  Diagnosis:Bipolar Affective Disorder   Shaune PollackBrown, Jennifer B, [facility] 08/27/2017

## 2017-09-03 ENCOUNTER — Ambulatory Visit (INDEPENDENT_AMBULATORY_CARE_PROVIDER_SITE_OTHER): Payer: PRIVATE HEALTH INSURANCE | Admitting: Psychiatry

## 2017-09-03 DIAGNOSIS — F3131 Bipolar disorder, current episode depressed, mild: Secondary | ICD-10-CM | POA: Diagnosis not present

## 2017-09-09 NOTE — Progress Notes (Signed)
   THERAPIST PROGRESS NOTE   Session Time:1:10-2:00  Participation Level:Active  Behavioral Response:CasualAlertEuthymic  Type of Therapy:Individual Therapy  Treatment Goals addressed:Coping; Relationship boundaries  Interventions:CBT and Supportive  Summary:Stefanie D Lineberryis a 52 y.o.femalewho presents withBipolar Disorder  Suicidal/Homicidal:Nowithout intent/plan  Therapist Response:Pt. Continues to present with euthymic mood, talkative, engaged in therapeutic process. Pt. Discussed that she continues to excel at her new work position, is extending herself positively in relationships with her co-workers. Pt. Discussed situation with a difficult customer that she was able to handle professionally, with healthy boundaries, and use humor to cope effectively. Similarly, Pt. Discussed recent interaction with her mother who was invasive of her personal space and made assumptions about her behavior. Pt. Was happy that she was able to remain respectful with her mother and not engage her mother's behavior. Pt. Is developing insight about her mother's interactions and her capacity to acknowledge, but not to engage to further an argument. Pt. Discussed upcoming trip to [place] that she is very excited about. Pt. Discussed her plans to stay with her female friend, visit with old female friends, and enjoy some time by herself. Pt. Discussed plans to make short trips a regular part of her ongoing self-care.  Plan: Return again in2weeks, Continue with CBT based treatment  Diagnosis:Bipolar Affective Disorder   Shaune PollackBrown, [name], [name] 09/09/2017

## 2017-09-12 ENCOUNTER — Encounter (HOSPITAL_COMMUNITY): Payer: Self-pay | Admitting: Psychiatry

## 2017-09-12 ENCOUNTER — Ambulatory Visit (INDEPENDENT_AMBULATORY_CARE_PROVIDER_SITE_OTHER): Payer: PRIVATE HEALTH INSURANCE | Admitting: Psychiatry

## 2017-09-12 DIAGNOSIS — Z818 Family history of other mental and behavioral disorders: Secondary | ICD-10-CM

## 2017-09-12 DIAGNOSIS — G4700 Insomnia, unspecified: Secondary | ICD-10-CM | POA: Diagnosis not present

## 2017-09-12 DIAGNOSIS — F3131 Bipolar disorder, current episode depressed, mild: Secondary | ICD-10-CM | POA: Diagnosis not present

## 2017-09-12 DIAGNOSIS — Z915 Personal history of self-harm: Secondary | ICD-10-CM

## 2017-09-12 DIAGNOSIS — F4310 Post-traumatic stress disorder, unspecified: Secondary | ICD-10-CM | POA: Diagnosis not present

## 2017-09-12 MED ORDER — ESCITALOPRAM OXALATE 10 MG PO TABS: 10.0000 mg | ORAL_TABLET | Freq: Every day | ORAL | 2 refills | Status: DC

## 2017-09-12 MED ORDER — LITHIUM CARBONATE 300 MG PO CAPS
300.0000 mg | ORAL_CAPSULE | Freq: Three times a day (TID) | ORAL | 2 refills | Status: DC
Start: 2017-09-12 — End: 2017-09-24

## 2017-09-12 MED ORDER — TRAZODONE HCL 100 MG PO TABS: ORAL_TABLET | ORAL | 2 refills | Status: DC
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
## 2017-09-17 ENCOUNTER — Ambulatory Visit (HOSPITAL_COMMUNITY): Payer: Self-pay | Admitting: Psychiatry

## 2017-09-18 ENCOUNTER — Ambulatory Visit (HOSPITAL_COMMUNITY): Payer: PRIVATE HEALTH INSURANCE | Admitting: Psychiatry

## 2017-09-22 ENCOUNTER — Inpatient Hospital Stay (HOSPITAL_COMMUNITY)
Admission: EM | Admit: 2017-09-22 | Discharge: 2017-09-24 | DRG: 918 | Disposition: A | Discharge status: Other Acute Inpatient Hospital | Payer: PRIVATE HEALTH INSURANCE | Attending: Internal Medicine | Admitting: Internal Medicine

## 2017-09-22 DIAGNOSIS — T43222A Poisoning by selective serotonin reuptake inhibitors, intentional self-harm, initial encounter: Secondary | ICD-10-CM | POA: Diagnosis present

## 2017-09-22 DIAGNOSIS — R9431 Abnormal electrocardiogram [ECG] [EKG]: Secondary | ICD-10-CM | POA: Diagnosis present

## 2017-09-22 DIAGNOSIS — Z79899 Other long term (current) drug therapy: Secondary | ICD-10-CM

## 2017-09-22 DIAGNOSIS — F3132 Bipolar disorder, current episode depressed, moderate: Secondary | ICD-10-CM | POA: Diagnosis present

## 2017-09-22 DIAGNOSIS — F3130 Bipolar disorder, current episode depressed, mild or moderate severity, unspecified: Secondary | ICD-10-CM | POA: Diagnosis present

## 2017-09-22 DIAGNOSIS — F4325 Adjustment disorder with mixed disturbance of emotions and conduct: Secondary | ICD-10-CM

## 2017-09-22 DIAGNOSIS — G4700 Insomnia, unspecified: Secondary | ICD-10-CM | POA: Diagnosis present

## 2017-09-22 DIAGNOSIS — I4581 Long QT syndrome: Secondary | ICD-10-CM | POA: Diagnosis present

## 2017-09-22 DIAGNOSIS — T43212A Poisoning by selective serotonin and norepinephrine reuptake inhibitors, intentional self-harm, initial encounter: Secondary | ICD-10-CM | POA: Diagnosis present

## 2017-09-22 DIAGNOSIS — T56892A Toxic effect of other metals, intentional self-harm, initial encounter: Principal | ICD-10-CM | POA: Diagnosis present

## 2017-09-22 DIAGNOSIS — F319 Bipolar disorder, unspecified: Secondary | ICD-10-CM

## 2017-09-22 DIAGNOSIS — Z818 Family history of other mental and behavioral disorders: Secondary | ICD-10-CM

## 2017-09-22 DIAGNOSIS — F4312 Post-traumatic stress disorder, chronic: Secondary | ICD-10-CM | POA: Diagnosis present

## 2017-09-22 DIAGNOSIS — T56891A Toxic effect of other metals, accidental (unintentional), initial encounter: Secondary | ICD-10-CM | POA: Diagnosis present

## 2017-09-22 LAB — CBC
HCT: 36.8 % (ref 36.0–46.0)
Hemoglobin: 12.4 g/dL (ref 12.0–15.0)
MCH: 28.2 pg (ref 26.0–34.0)
MCHC: 33.7 g/dL (ref 30.0–36.0)
MCV: 83.6 fL (ref 78.0–100.0)
PLATELETS: 239 10*3/uL (ref 150–400)
RBC: 4.4 MIL/uL (ref 3.87–5.11)
RDW: 13.6 % (ref 11.5–15.5)
WBC: 6.7 10*3/uL (ref 4.0–10.5)

## 2017-09-22 LAB — COMPREHENSIVE METABOLIC PANEL
ALT: 23 U/L (ref 14–54)
ANION GAP: 9 (ref 5–15)
AST: 26 U/L (ref 15–41)
Albumin: 3.9 g/dL (ref 3.5–5.0)
Alkaline Phosphatase: 62 U/L (ref 38–126)
BUN: 6 mg/dL (ref 6–20)
CHLORIDE: 109 mmol/L (ref 101–111)
CO2: 24 mmol/L (ref 22–32)
CREATININE: 0.8 mg/dL (ref 0.44–1.00)
Calcium: 9 mg/dL (ref 8.9–10.3)
Glucose, Bld: 104 mg/dL — ABNORMAL HIGH (ref 65–99)
POTASSIUM: 3.8 mmol/L (ref 3.5–5.1)
SODIUM: 142 mmol/L (ref 135–145)
Total Bilirubin: 0.9 mg/dL (ref 0.3–1.2)
Total Protein: 7.1 g/dL (ref 6.5–8.1)

## 2017-09-22 LAB — LITHIUM LEVEL
Lithium Lvl: 0.89 mmol/L (ref 0.60–1.20)
Lithium Lvl: 2.17 mmol/L (ref 0.60–1.20)

## 2017-09-22 LAB — RAPID URINE DRUG SCREEN, HOSP PERFORMED
AMPHETAMINES: NOT DETECTED
Barbiturates: NOT DETECTED
Benzodiazepines: NOT DETECTED
COCAINE: NOT DETECTED
OPIATES: NOT DETECTED
Tetrahydrocannabinol: NOT DETECTED

## 2017-09-22 LAB — SALICYLATE LEVEL

## 2017-09-22 LAB — ACETAMINOPHEN LEVEL
Acetaminophen (Tylenol), Serum: <10 ug/mL — ABNORMAL LOW (ref 10–30)
Acetaminophen (Tylenol), Serum: <10 ug/mL — ABNORMAL LOW (ref 10–30)

## 2017-09-22 LAB — MAGNESIUM: Magnesium: 2 mg/dL (ref 1.7–2.4)

## 2017-09-22 LAB — ETHANOL

## 2017-09-22 MED ORDER — ACETAMINOPHEN 650 MG RE SUPP
650.0000 mg | Freq: Four times a day (QID) | RECTAL | Status: DC | PRN
Start: 2017-09-22 — End: 2017-09-24

## 2017-09-22 MED ORDER — SODIUM CHLORIDE 0.9 % IV BOLUS
1000.0000 mL | Freq: Once | INTRAVENOUS | Status: AC
  Administered 2017-09-22: 1000 mL via INTRAVENOUS

## 2017-09-22 MED ORDER — ACETAMINOPHEN 325 MG PO TABS
650.0000 mg | ORAL_TABLET | Freq: Four times a day (QID) | ORAL | Status: DC | PRN
  Administered 2017-09-24: 650 mg via ORAL
  Filled 2017-09-22: qty 2

## 2017-09-22 MED ORDER — SODIUM CHLORIDE 0.9 % IV SOLN
INTRAVENOUS | Status: DC
  Administered 2017-09-22 – 2017-09-23 (×4): via INTRAVENOUS

## 2017-09-22 NOTE — ED Notes (Signed)
Poison control recommended that patient get maintained iv fluid and replace magnesium and potassium. Continue to monitor patient neuro status and recheck EKG after magnesium and potassium is completed. Recheck lithium levels every 2-3 hours apart. Also check vitals sign including temp.

## 2017-09-22 NOTE — H&P (Signed)
History and Physical    Stefanie Galvan ZOX:[id number] DOB: [birth year] DOA: 09/22/2017  PCP: Alain Honey, MD  Patient coming from: Home  I have personally briefly reviewed patient's old medical records in [facility]  Chief Complaint: OD  HPI: Stefanie Galvan is a 52 y.o. female with medical history significant of BPD on lithium.  Patient presents to ED after OD on trazodone, lexapro, and lithium in apparent self-harm attempt.  Patient is sleepy at this time but answering questions.  ED Course: Lithium level 0.89 initially, 2.17 on repeat.  EKG shows slight QTc prolongation in the 400s.   Review of Systems: As per HPI otherwise 10 point review of systems negative.   Past Medical History:  Diagnosis Date  . Anxiety   . Arthritis   . Depression 2.5 yrs ago   situational dep-no current tx/meds  . TBI (traumatic brain injury) (HCC) 11/10/2005    Past Surgical History:  Procedure Laterality Date  . BREAST REDUCTION SURGERY  02/25/2012   Procedure: MAMMARY REDUCTION  (BREAST);  Surgeon: Louisa Second, MD;  Location: [facility];  Service: Plastics;  Laterality: Bilateral;  bilateral reduction  . DILATION AND CURETTAGE OF UTERUS  2001  . TEMPOROMANDIBULAR JOINT SURGERY  1984; 1996   X 2; bilateral  . TUBAL LIGATION  2003     reports that she has never smoked. She has never used smokeless tobacco. She reports that she drinks alcohol. She reports that she does not use drugs.  Allergies  Allergen Reactions  . Anaprox [Naproxen Sodium] Shortness Of Breath    Heart palpitations, shortness of breath, generalized swelling    Family History  Problem Relation Age of Onset  . Depression Father   . Anxiety disorder Brother   . Depression Maternal Grandmother      Prior to Admission medications   Medication Sig Start Date End Date Taking? Authorizing Provider  escitalopram (LEXAPRO) 10 MG tablet Take 1 tablet (10 mg total) by mouth at bedtime. For  mood control 09/12/17  Yes Arfeen, Phillips Grout, MD  lithium carbonate 300 MG capsule Take 1 capsule (300 mg total) by mouth 3 (three) times daily. For mood control 09/12/17  Yes Arfeen, Phillips Grout, MD  traZODone (DESYREL) 100 MG tablet Take 1/2 to 1 tab as needed for insomnia 09/12/17  Yes Arfeen, Phillips Grout, MD  hydrOXYzine (ATARAX/VISTARIL) 25 MG tablet Take 1-2 capsule as needed for anxiety Patient not taking: Reported on 09/22/2017 05/30/17   Cleotis Nipper, MD    Physical Exam: Vitals:   09/22/17 2030 09/22/17 2130 09/22/17 2200 09/22/17 2300  BP: 118/78 100/62 100/65 111/68  Pulse: 62 66 62 (!) 54  Resp: 16 15 15 13   Temp:      TempSrc:      SpO2: 99% 94% 95% 96%  Weight:      Height:        Constitutional: NAD, calm, comfortable Eyes: PERRL, lids and conjunctivae normal ENMT: Mucous membranes are moist. Posterior pharynx clear of any exudate or lesions.Normal dentition.  Neck: normal, supple, no masses, no thyromegaly Respiratory: clear to auscultation bilaterally, no wheezing, no crackles. Normal respiratory effort. No accessory muscle use.  Cardiovascular: Regular rate and rhythm, no murmurs / rubs / gallops. No extremity edema. 2+ pedal pulses. No carotid bruits.  Abdomen: no tenderness, no masses palpated. No hepatosplenomegaly. Bowel sounds positive.  Musculoskeletal: no clubbing / cyanosis. No joint deformity upper and lower extremities. Good ROM, no contractures. Normal muscle tone.  Skin: no rashes, lesions, ulcers. No induration Neurologic: CN 2-12 grossly intact. Sensation intact, DTR normal. Strength 5/5 in all 4.  Psychiatric: Drowsy, flat affect, depressed mood.   Labs on Admission: I have personally reviewed following labs and imaging studies  CBC: Recent Labs  Lab 09/22/17 1518  WBC 6.7  HGB 12.4  HCT 36.8  MCV 83.6  PLT 239   Basic Metabolic Panel: Recent Labs  Lab 09/22/17 1518 09/22/17 1555  NA 142  --   K 3.8  --   CL 109  --   CO2 24  --   GLUCOSE 104*   --   BUN 6  --   CREATININE 0.80  --   CALCIUM 9.0  --   MG  --  2.0   GFR: Estimated Creatinine Clearance: 98.6 mL/min (by C-G formula based on SCr of 0.8 mg/dL). Liver Function Tests: Recent Labs  Lab 09/22/17 1518  AST 26  ALT 23  ALKPHOS 62  BILITOT 0.9  PROT 7.1  ALBUMIN 3.9   No results for input(s): LIPASE, AMYLASE in the last 168 hours. No results for input(s): AMMONIA in the last 168 hours. Coagulation Profile: No results for input(s): INR, PROTIME in the last 168 hours. Cardiac Enzymes: No results for input(s): CKTOTAL, CKMB, CKMBINDEX, TROPONINI in the last 168 hours. BNP (last 3 results) No results for input(s): PROBNP in the last 8760 hours. HbA1C: No results for input(s): HGBA1C in the last 72 hours. CBG: No results for input(s): GLUCAP in the last 168 hours. Lipid Profile: No results for input(s): CHOL, HDL, LDLCALC, TRIG, CHOLHDL, LDLDIRECT in the last 72 hours. Thyroid Function Tests: No results for input(s): TSH, T4TOTAL, FREET4, T3FREE, THYROIDAB in the last 72 hours. Anemia Panel: No results for input(s): VITAMINB12, FOLATE, FERRITIN, TIBC, IRON, RETICCTPCT in the last 72 hours. Urine analysis:    Component Value Date/Time   COLORURINE YELLOW 03/19/2017 1705   APPEARANCEUR HAZY (A) 03/19/2017 1705   LABSPEC 1.021 03/19/2017 1705   PHURINE 8.0 03/19/2017 1705   GLUCOSEU NEGATIVE 03/19/2017 1705   HGBUR NEGATIVE 03/19/2017 1705   BILIRUBINUR NEGATIVE 03/19/2017 1705   KETONESUR NEGATIVE 03/19/2017 1705   PROTEINUR 30 (A) 03/19/2017 1705   UROBILINOGEN 0.2 09/12/2008 1641   NITRITE NEGATIVE 03/19/2017 1705   LEUKOCYTESUR TRACE (A) 03/19/2017 1705    Radiological Exams on Admission: No results found.  EKG: Independently reviewed.  Assessment/Plan Principal Problem:   Lithium overdose, intentional self-harm, initial encounter [facility](HCC) Active Problems:   Bipolar 1 disorder, depressed (HCC)   QT prolongation    1. Lithium OD - 1. IVF: 3L  NS in ED then 125 cc/hr 2. Checking levels Q6H 3. Psych eval in AM 4. Sitter at bedside and suicide precautions 2. QT prolongation - 1. Minor at the moment in the upper 400s 2. Tele monitor to watch QTC  DVT prophylaxis: Lovenox Code Status: Full Family Communication: No family in room Disposition Plan: Home after admit Consults called: None Admission status: Place in obs   GARDNER, Heywood IlesJARED M. DO [facility] Pager [phone]  If 7AM-7PM, please contact day team taking care of patient [email or web address] Password [email or web address]  09/22/2017, 11:37 PM

## 2017-09-22 NOTE — ED Notes (Signed)
ED TO INPATIENT HANDOFF REPORT  Name/Age/Gender Stefanie Galvan 52 y.o. female  Code Status    Code Status Orders  (From admission, onward)        Start     Ordered   09/22/17 2314  Full code  Continuous     09/22/17 2314    Code Status History    Date Active Date Inactive Code Status Order ID Comments User Context   03/19/2017 1600 03/22/2017 2211 Full Code [id number]  Vicenta Aly, NP Inpatient      Home/SNF/Other Home  Chief Complaint overdose   Level of Care/Admitting Diagnosis ED Disposition    ED Disposition Condition [facility] Area: [facility] [[id number]]  Level of Care: Telemetry [5]  Admit to tele based on following criteria: Monitor QTC interval  Diagnosis: Lithium overdose, intentional self-harm, initial encounter [facility]) [[id number]]  Admitting Physician: Etta Quill [phone]  Attending Physician: Etta Quill [[id number]]  PT Class (Do Not Modify): Observation [104]  PT Acc Code (Do Not Modify): Observation [10022]       Medical History Past Medical History:  Diagnosis Date  . Anxiety   . Arthritis   . Depression 2.5 yrs ago   situational dep-no current tx/meds  . TBI (traumatic brain injury) ([place]) 11/10/2005    Allergies Allergies  Allergen Reactions  . Anaprox [Naproxen Sodium] Shortness Of Breath    Heart palpitations, shortness of breath, generalized swelling    IV Location/Drains/Wounds Patient Lines/Drains/Airways Status   Active Line/Drains/Airways    None          Labs/Imaging Results for orders placed or performed during the hospital encounter of 09/22/17 (from the past 48 hour(s))  CBC     Status: None   Collection Time: 09/22/17  3:18 PM  Result Value Ref Range   WBC 6.7 4.0 - 10.5 K/uL   RBC 4.40 3.87 - 5.11 MIL/uL   Hemoglobin 12.4 12.0 - 15.0 g/dL   HCT 36.8 36.0 - 46.0 %   MCV 83.6 78.0 - 100.0 fL   MCH 28.2 26.0 - 34.0 pg   MCHC 33.7 30.0 - 36.0 g/dL   RDW 13.6 11.5 -  15.5 %   Platelets 239 150 - 400 K/uL    Comment: Performed at [facility], [place] [address], [place], [place] [address]  Comprehensive metabolic panel     Status: Abnormal   Collection Time: 09/22/17  3:18 PM  Result Value Ref Range   Sodium 142 135 - 145 mmol/L   Potassium 3.8 3.5 - 5.1 mmol/L   Chloride 109 101 - 111 mmol/L   CO2 24 22 - 32 mmol/L   Glucose, Bld 104 (H) 65 - 99 mg/dL   BUN 6 6 - 20 mg/dL   Creatinine, Ser 0.80 0.44 - 1.00 mg/dL   Calcium 9.0 8.9 - 10.3 mg/dL   Total Protein 7.1 6.5 - 8.1 g/dL   Albumin 3.9 3.5 - 5.0 g/dL   AST 26 15 - 41 U/L   ALT 23 14 - 54 U/L   Alkaline Phosphatase 62 38 - 126 U/L   Total Bilirubin 0.9 0.3 - 1.2 mg/dL   GFR calc non Af Amer >60 >60 mL/min   GFR calc Af Amer >60 >60 mL/min    Comment: (NOTE) The eGFR has been calculated using the CKD EPI equation. This calculation has not been validated in all clinical situations. eGFR's persistently <60 mL/min signify possible Chronic Kidney Disease.  Anion gap 9 5 - 15    Comment: Performed at [facility], [place] [address], [place], [place] [address]  Ethanol     Status: None   Collection Time: 09/22/17  3:18 PM  Result Value Ref Range   Alcohol, Ethyl (B) <10 <10 mg/dL    Comment:        LOWEST DETECTABLE LIMIT FOR SERUM ALCOHOL IS 10 mg/dL FOR MEDICAL PURPOSES ONLY Performed at [facility], [place] [address] [place], [place] [address]   Rapid urine drug screen (hospital performed)     Status: None   Collection Time: 09/22/17  3:18 PM  Result Value Ref Range   Opiates NONE DETECTED NONE DETECTED   Cocaine NONE DETECTED NONE DETECTED   Benzodiazepines NONE DETECTED NONE DETECTED   Amphetamines NONE DETECTED NONE DETECTED   Tetrahydrocannabinol NONE DETECTED NONE DETECTED   Barbiturates NONE DETECTED NONE DETECTED    Comment: (NOTE) DRUG SCREEN FOR MEDICAL PURPOSES ONLY.  IF CONFIRMATION IS NEEDED FOR ANY PURPOSE, NOTIFY  LAB WITHIN 5 DAYS. LOWEST DETECTABLE LIMITS FOR URINE DRUG SCREEN Drug Class                     Cutoff (ng/mL) Amphetamine and metabolites    1000 Barbiturate and metabolites    200 Benzodiazepine                 233 Tricyclics and metabolites     300 Opiates and metabolites        300 Cocaine and metabolites        300 THC                            50 Performed at [facility], [place] [address] [place], [place] [address]   Acetaminophen level     Status: Abnormal   Collection Time: 09/22/17  3:18 PM  Result Value Ref Range   Acetaminophen (Tylenol), Serum <10 (L) 10 - 30 ug/mL    Comment:        THERAPEUTIC CONCENTRATIONS VARY SIGNIFICANTLY. A RANGE OF 10-30 ug/mL MAY BE AN EFFECTIVE CONCENTRATION FOR MANY PATIENTS. HOWEVER, SOME ARE BEST TREATED AT CONCENTRATIONS OUTSIDE THIS RANGE. ACETAMINOPHEN CONCENTRATIONS >150 ug/mL AT 4 HOURS AFTER INGESTION AND >50 ug/mL AT 12 HOURS AFTER INGESTION ARE OFTEN ASSOCIATED WITH TOXIC REACTIONS. Performed at [facility], [place] [address] [place], [place] [address]   Salicylate level     Status: None   Collection Time: 09/22/17  3:18 PM  Result Value Ref Range   Salicylate Lvl <5.4 2.8 - 30.0 mg/dL    Comment: Performed at [facility], [place] [address], [place], [place] [address]  Lithium level     Status: None   Collection Time: 09/22/17  3:18 PM  Result Value Ref Range   Lithium Lvl 0.89 0.60 - 1.20 mmol/L    Comment: Performed at [facility], [place] [address], [place], [place] [address]  Magnesium     Status: None   Collection Time: 09/22/17  3:55 PM  Result Value Ref Range   Magnesium 2.0 1.7 - 2.4 mg/dL    Comment: Performed at [facility], [place] [address], [place], [place] [address]  Lithium level     Status: Abnormal   Collection Time: 09/22/17  8:01 PM  Result Value Ref Range   Lithium Lvl 2.17 (HH) 0.60 - 1.20  mmol/L     Comment: CRITICAL RESULT CALLED TO, READ BACK BY AND VERIFIED WITH: ATTAKINS,LISA AT 2048 ON 09/22/17 BY MOHAMED,A Performed at [facility], [place] [address] [place], [place] [address]   Acetaminophen level     Status: Abnormal   Collection Time: 09/22/17  8:01 PM  Result Value Ref Range   Acetaminophen (Tylenol), Serum <10 (L) 10 - 30 ug/mL    Comment:        THERAPEUTIC CONCENTRATIONS VARY SIGNIFICANTLY. A RANGE OF 10-30 ug/mL MAY BE AN EFFECTIVE CONCENTRATION FOR MANY PATIENTS. HOWEVER, SOME ARE BEST TREATED AT CONCENTRATIONS OUTSIDE THIS RANGE. ACETAMINOPHEN CONCENTRATIONS >150 ug/mL AT 4 HOURS AFTER INGESTION AND >50 ug/mL AT 12 HOURS AFTER INGESTION ARE OFTEN ASSOCIATED WITH TOXIC REACTIONS. Performed at [facility], [place] [address] [place], [place] [address]    No results found.  Pending Labs Unresulted Labs (From admission, onward)   Start     Ordered   09/23/17 0000  Lithium level  Now then every 6 hours,   R     09/22/17 2312   09/22/17 2314  HIV antibody (Routine Testing)  Once,   R     09/22/17 2314      Vitals/Pain Today's Vitals   09/22/17 2030 09/22/17 2130 09/22/17 2200 09/22/17 2300  BP: 118/78 100/62 100/65 111/68  Pulse: 62 66 62 (!) 54  Resp: _0 Temp:      TempSrc:      SpO2: 99% 94% 95% 96%  Weight:      Height:        Isolation Precautions No active isolations  Medications Medications  acetaminophen (TYLENOL) tablet 650 mg (has no administration in time range)    Or  acetaminophen (TYLENOL) suppository 650 mg (has no administration in time range)  0.9 %  sodium chloride infusion (has no administration in time range)  sodium chloride 0.9 % bolus 1,000 mL (0 mLs Intravenous Stopped 09/22/17 1848)  sodium chloride 0.9 % bolus 1,000 mL (0 mLs Intravenous Stopped 09/22/17 2041)  sodium chloride 0.9 % bolus 1,000 mL (0 mLs Intravenous Stopped 09/22/17 2311)    Mobility walks

## 2017-09-22 NOTE — ED Provider Notes (Signed)
[facility]-EMERGENCY DEPT Provider Note   CSN: [id number] Arrival date & time: 09/22/17  1455     History   Chief Complaint Chief Complaint  Patient presents with  . Ingestion    HPI Stefanie Galvan is a 52 y.o. female.  Patient with hx bipolar disorder, presents approximately 1 hr post intentional overdose. States she took about 10 of her lithium and trazodone after argument with mother. Denies other ingestion. +depressed/stressed. Denies other attempt at self harm. Symptoms acute onset, moderate, persistent, worse today. Post ingestion, has had episode of nausea and vomiting.   The history is provided by the patient.  Ingestion  Pertinent negatives include no chest pain, no abdominal pain, no headaches and no shortness of breath.    Past Medical History:  Diagnosis Date  . Anxiety   . Arthritis   . Depression 2.5 yrs ago   situational dep-no current tx/meds  . TBI (traumatic brain injury) (HCC) 11/10/2005    Patient Active Problem List   Diagnosis Date Noted  . Bipolar 1 disorder, depressed (HCC) 03/19/2017  . Bipolar 2 disorder, major depressive episode (HCC) 03/04/2017    Class: Chronic  . Chronic post-traumatic stress disorder (PTSD) 03/04/2017    Class: Chronic  . DDD (degenerative disc disease), lumbar 03/04/2017  . Arthritis 08/29/2016    Past Surgical History:  Procedure Laterality Date  . BREAST REDUCTION SURGERY  02/25/2012   Procedure: MAMMARY REDUCTION  (BREAST);  Surgeon: Louisa Second, MD;  Location: [facility];  Service: Plastics;  Laterality: Bilateral;  bilateral reduction  . DILATION AND CURETTAGE OF UTERUS  2001  . TEMPOROMANDIBULAR JOINT SURGERY  1984; 1996   X 2; bilateral  . TUBAL LIGATION  2003     OB History   None      Home Medications    Prior to Admission medications   Medication Sig Start Date End Date Taking? Authorizing Provider  escitalopram (LEXAPRO) 10 MG tablet Take 1 tablet  (10 mg total) by mouth at bedtime. For mood control 09/12/17   Arfeen, Phillips Grout, MD  hydrOXYzine (ATARAX/VISTARIL) 25 MG tablet Take 1-2 capsule as needed for anxiety 05/30/17   Arfeen, Phillips Grout, MD  lithium carbonate 300 MG capsule Take 1 capsule (300 mg total) by mouth 3 (three) times daily. For mood control 09/12/17   Arfeen, Phillips Grout, MD  traZODone (DESYREL) 100 MG tablet Take 1/2 to 1 tab as needed for insomnia 09/12/17   Arfeen, Phillips Grout, MD    Family History Family History  Problem Relation Age of Onset  . Depression Father   . Anxiety disorder Brother   . Depression Maternal Grandmother     Social History Social History   Tobacco Use  . Smoking status: Never Smoker  . Smokeless tobacco: Never Used  Substance Use Topics  . Alcohol use: Yes    Comment: Occasional drink   . Drug use: No     Allergies   Anaprox [naproxen sodium]   Review of Systems Review of Systems  Constitutional: Negative for fever.  HENT: Negative for sore throat.   Eyes: Negative for redness.  Respiratory: Negative for shortness of breath.   Cardiovascular: Negative for chest pain.  Gastrointestinal: Positive for nausea and vomiting. Negative for abdominal pain.  Genitourinary: Negative for flank pain.  Musculoskeletal: Negative for neck pain.  Skin: Negative for rash.  Neurological: Negative for headaches.  Hematological: Does not bruise/bleed easily.  Psychiatric/Behavioral: Positive for dysphoric mood and suicidal ideas.  The patient is nervous/anxious.      Physical Exam Updated Vital Signs BP (!) 153/96   Pulse (!) 122   Temp 98.7 F (37.1 C) (Oral)   Resp 18   Ht 1.702 m (5\' 7" )   Wt 95.3 kg (210 lb)   SpO2 97%   BMI 32.89 kg/m   Physical Exam  Constitutional: She appears well-developed and well-nourished. No distress.  HENT:  Head: Atraumatic.  Mouth/Throat: Oropharynx is clear and moist.  Eyes: Pupils are equal, round, and reactive to light. Conjunctivae are normal. No scleral  icterus.  Neck: Neck supple. No tracheal deviation present.  Cardiovascular: Normal rate, regular rhythm, normal heart sounds and intact distal pulses.  Pulmonary/Chest: Effort normal and breath sounds normal. No respiratory distress.  Abdominal: Soft. Normal appearance and bowel sounds are normal. She exhibits no distension. There is no tenderness.  Musculoskeletal: She exhibits no edema.  Neurological: She is alert.  Drowsy, but answers questions. Motor intact bil, stre 5/5.   Skin: Skin is warm and dry. No rash noted. She is not diaphoretic.  Psychiatric:  Drowsy. Flat affect, depressed mood.   Nursing note and vitals reviewed.    ED Treatments / Results  Labs (all labs ordered are listed, but only abnormal results are displayed) Results for orders placed or performed during the hospital encounter of 09/22/17  CBC  Result Value Ref Range   WBC 6.7 4.0 - 10.5 K/uL   RBC 4.40 3.87 - 5.11 MIL/uL   Hemoglobin 12.4 12.0 - 15.0 g/dL   HCT 16.136.8 09.636.0 - 04.546.0 %   MCV 83.6 78.0 - 100.0 fL   MCH 28.2 26.0 - 34.0 pg   MCHC 33.7 30.0 - 36.0 g/dL   RDW 40.913.6 81.111.5 - 91.415.5 %   Platelets 239 150 - 400 K/uL  Comprehensive metabolic panel  Result Value Ref Range   Sodium 142 135 - 145 mmol/L   Potassium 3.8 3.5 - 5.1 mmol/L   Chloride 109 101 - 111 mmol/L   CO2 24 22 - 32 mmol/L   Glucose, Bld 104 (H) 65 - 99 mg/dL   BUN 6 6 - 20 mg/dL   Creatinine, Ser 7.820.80 0.44 - 1.00 mg/dL   Calcium 9.0 8.9 - 95.610.3 mg/dL   Total Protein 7.1 6.5 - 8.1 g/dL   Albumin 3.9 3.5 - 5.0 g/dL   AST 26 15 - 41 U/L   ALT 23 14 - 54 U/L   Alkaline Phosphatase 62 38 - 126 U/L   Total Bilirubin 0.9 0.3 - 1.2 mg/dL   GFR calc non Af Amer >60 >60 mL/min   GFR calc Af Amer >60 >60 mL/min   Anion gap 9 5 - 15  Ethanol  Result Value Ref Range   Alcohol, Ethyl (B) <10 <10 mg/dL  Rapid urine drug screen (hospital performed)  Result Value Ref Range   Opiates NONE DETECTED NONE DETECTED   Cocaine NONE DETECTED NONE  DETECTED   Benzodiazepines NONE DETECTED NONE DETECTED   Amphetamines NONE DETECTED NONE DETECTED   Tetrahydrocannabinol NONE DETECTED NONE DETECTED   Barbiturates NONE DETECTED NONE DETECTED  Acetaminophen level  Result Value Ref Range   Acetaminophen (Tylenol), Serum <10 (L) 10 - 30 ug/mL  Salicylate level  Result Value Ref Range   Salicylate Lvl <7.0 2.8 - 30.0 mg/dL  Lithium level  Result Value Ref Range   Lithium Lvl 0.89 0.60 - 1.20 mmol/L  Magnesium  Result Value Ref Range   Magnesium 2.0 1.7 -  2.4 mg/dL  Lithium level  Result Value Ref Range   Lithium Lvl 2.17 (HH) 0.60 - 1.20 mmol/L  Acetaminophen level  Result Value Ref Range   Acetaminophen (Tylenol), Serum <10 (L) 10 - 30 ug/mL    EKG None  Radiology No results found.  Procedures Procedures (including critical care time)  Medications Ordered in ED Medications  sodium chloride 0.9 % bolus 1,000 mL (has no administration in time range)     Initial Impression / Assessment and Plan / ED Course  I have reviewed the triage vital signs and the nursing notes.  Pertinent labs & imaging results that were available during my care of the patient were reviewed by me and considered in my medical decision making (see chart for details).  Iv ns. Labs. Continuous pulse ox and monitor.   Iv ns bolus. zofran iv.  Poison control contacted.   Reviewed nursing notes and prior charts for additional history.   Poison control recommends observation, iv fluids, repeat li level, inpatient monitoring.   Repeat lithium level is increased. Repeat ecg at 2039, qtc 474 (not increased  From earlier).  Medical service consulted for admission.  Pt remains awake. No resp depression. Vitals normal.    Final Clinical Impressions(s) / ED Diagnoses   Final diagnoses:  None    ED Discharge Orders    None       Cathren Laine, MD 09/22/17 2106

## 2017-09-22 NOTE — ED Notes (Signed)
Posion control is concerned QTC prolongation, if over 500 replace potassium and magnesium. States to recheck lithium levels every 4-6 hours. Observe for 24 hrs.  Continuing IV fluids to reach 1-2cc per kg per hour of urine.

## 2017-09-22 NOTE — ED Triage Notes (Signed)
Pt took unknown amount of Trazodone 100mg  Escitalopram 10mg  and Lithium 300 mg approx 30 min ago, mother told her she was a piece of trash. Pt sleepy and becoming confused in triage.

## 2017-09-22 NOTE — ED Notes (Signed)
Bed: WA15 Expected date:  Expected time:  Means of arrival:  Comments: Resus B 

## 2017-09-22 NOTE — ED Notes (Signed)
Poison recommend replacing magnesium and potassium replace to high normal. Also to given normal saline iv

## 2017-09-23 ENCOUNTER — Other Ambulatory Visit: Payer: Self-pay

## 2017-09-23 ENCOUNTER — Encounter (HOSPITAL_COMMUNITY): Payer: Self-pay

## 2017-09-23 DIAGNOSIS — Z818 Family history of other mental and behavioral disorders: Secondary | ICD-10-CM

## 2017-09-23 DIAGNOSIS — F4325 Adjustment disorder with mixed disturbance of emotions and conduct: Secondary | ICD-10-CM

## 2017-09-23 DIAGNOSIS — F4312 Post-traumatic stress disorder, chronic: Secondary | ICD-10-CM | POA: Diagnosis present

## 2017-09-23 DIAGNOSIS — Z6379 Other stressful life events affecting family and household: Secondary | ICD-10-CM

## 2017-09-23 DIAGNOSIS — I4581 Long QT syndrome: Secondary | ICD-10-CM | POA: Diagnosis present

## 2017-09-23 DIAGNOSIS — T56891A Toxic effect of other metals, accidental (unintentional), initial encounter: Secondary | ICD-10-CM | POA: Diagnosis present

## 2017-09-23 DIAGNOSIS — G4700 Insomnia, unspecified: Secondary | ICD-10-CM | POA: Diagnosis not present

## 2017-09-23 DIAGNOSIS — Z62820 Parent-biological child conflict: Secondary | ICD-10-CM | POA: Diagnosis not present

## 2017-09-23 DIAGNOSIS — T43222A Poisoning by selective serotonin reuptake inhibitors, intentional self-harm, initial encounter: Secondary | ICD-10-CM

## 2017-09-23 DIAGNOSIS — T56892A Toxic effect of other metals, intentional self-harm, initial encounter: Secondary | ICD-10-CM | POA: Diagnosis not present

## 2017-09-23 DIAGNOSIS — T1491XA Suicide attempt, initial encounter: Secondary | ICD-10-CM | POA: Diagnosis not present

## 2017-09-23 DIAGNOSIS — T43212A Poisoning by selective serotonin and norepinephrine reuptake inhibitors, intentional self-harm, initial encounter: Secondary | ICD-10-CM | POA: Diagnosis not present

## 2017-09-23 DIAGNOSIS — Z79899 Other long term (current) drug therapy: Secondary | ICD-10-CM | POA: Diagnosis not present

## 2017-09-23 DIAGNOSIS — F3130 Bipolar disorder, current episode depressed, mild or moderate severity, unspecified: Secondary | ICD-10-CM | POA: Diagnosis present

## 2017-09-23 LAB — HIV ANTIBODY (ROUTINE TESTING W REFLEX): HIV SCREEN 4TH GENERATION: NONREACTIVE

## 2017-09-23 LAB — LITHIUM LEVEL
Lithium Lvl: 1.4 mmol/L — ABNORMAL HIGH (ref 0.60–1.20)
Lithium Lvl: 1.93 mmol/L (ref 0.60–1.20)

## 2017-09-23 MED ORDER — ENOXAPARIN SODIUM 40 MG/0.4ML ~~LOC~~ SOLN
40.0000 mg | SUBCUTANEOUS | Status: DC
  Administered 2017-09-23: 40 mg via SUBCUTANEOUS
  Filled 2017-09-23: qty 0.4

## 2017-09-23 NOTE — Progress Notes (Signed)
Patient arrived to room 1515 from ED. Patient is alert and oriented x4. Has SI sitter at bedside. Patient has right AC IV infusing NS at 125. Patient ambulatory with standby assist. Has occasional dyskinesia to arms. Patient reports not new symptom as occurs while she takes Lithium. Patient placed on tele box 55. Oriented to room, call bed, bed use. Room is prepped and checked per SI precautions. Advised pt of SI precautions. Pt reports no intent to harm self now and no ideations. Will ct monitor.

## 2017-09-23 NOTE — Progress Notes (Signed)
Phone call f/u from [employer]. Discussed vitals, tele monitoring, QRS, QTC, lithium level and pt urinary output. Advised that patient will be continued to be monitored for 24 hours from poison control. Will c/t monitor.

## 2017-09-23 NOTE — Consult Note (Addendum)
[facility] Consult   Reason for Consult:  Suicide attempt by drug overdose. Referring Physician:  Dr. Herbert Moors Patient Identification: Stefanie Galvan MRN:  [id number] Principal Diagnosis: Adjustment disorder with mixed disturbance of emotions and conduct Diagnosis:   Patient Active Problem List   Diagnosis Date Noted  . Lithium overdose, intentional self-harm, initial encounter ([place]) [B63.893T] 09/22/2017  . QT prolongation [R94.31] 09/22/2017  . Bipolar 1 disorder, depressed ([place]) [F31.9] 03/19/2017  . Bipolar 2 disorder, major depressive episode ([place]) [F31.81] 03/04/2017    Class: Chronic  . Chronic post-traumatic stress disorder (PTSD) [F43.12] 03/04/2017    Class: Chronic  . DDD (degenerative disc disease), lumbar [M51.36] 03/04/2017  . Arthritis [M19.90] 08/29/2016    Total Time spent with patient: 1 hour  Subjective:   Stefanie Galvan is a 52 y.o. female patient admitted with suicide attempt by drug overdose.  HPI:   Per chart review, patient was admitted with suicide attempt by Lithium overdose. She has a history of BPAD. She overdosed on Trazodone, Lexapro and Lithium. Lithium level was 0.89 on admission (increased up to 2.17) with QTc prolongation of 514 UDS was negative and BAL was negative on admission.   Of note, she is followed by her outpatient psychiatrist, Dr. Adele Schilder. She was last seen on 09/12/17. She was noted to be doing well. Her home medications were continued. She is prescribed Lithium 300 mg TID, Lexapro 10 mg daily and Trazodone 50-100 mg qhs for insomnia.   On interview, [name] reports that her biggest trigger which caused her to overdose is her mother.  She reports that she rents her mother's basement and has no privacy.  She reports that her mother says "mean and hateful stuff" to her.  She had an argument yesterday with her mother.  She told her that she was going to overdose on her medications and that she should hurry up and  called 911 because she would have it done by the time she called.  She reports driving her car to the store and purchasing some water to ingest handfuls of multiple medications including Lithium, Trazodone and Lexapro.  She reports normally having good days and bad days.  She reports poor sleep and multiple nighttime awakenings.  She reports improved appetite.  She denies a history of manic symptoms.  She reports taking her medications as prescribed. She sees [name] every 2 weeks for therapy.   Past Psychiatric History: BPAD type 1, PTSD and anxiety   Risk to Self: Is patient at risk for suicide?: No, but patient needs Medical Clearance Risk to Others:  None. Denies HI.  Prior Inpatient Therapy:  She has a history of multiple hospitalizations for depression and suicide attempts.  Prior Outpatient Therapy:  She is followed by Dr. Adele Schilder. Prior medications include Seroquel, Zoloft, Cymbalta, Paxil, Prozac, Ativan, Wellbutrin, Abilify and Prazosin.   Past Medical History:  Past Medical History:  Diagnosis Date  . Anxiety   . Arthritis   . Depression 2.5 yrs ago   situational dep-no current tx/meds  . TBI (traumatic brain injury) ([place]) 11/10/2005    Past Surgical History:  Procedure Laterality Date  . BREAST REDUCTION SURGERY  02/25/2012   Procedure: MAMMARY REDUCTION  (BREAST);  Surgeon: Cristine Polio, MD;  Location: [facility];  Service: Plastics;  Laterality: Bilateral;  bilateral reduction  . DILATION AND CURETTAGE OF UTERUS  2001  . [place]; 1996   X 2; bilateral  . TUBAL LIGATION  2003   Family History:  Family History  Problem Relation Age of Onset  . Depression Father   . Anxiety disorder Brother   . Depression Maternal Grandmother    Family Psychiatric  History: As listed above.  Social History:  Social History   Substance and Sexual Activity  Alcohol Use Yes   Comment: Occasional drink      Social History    Substance and Sexual Activity  Drug Use No    Social History   Socioeconomic History  . Marital status: Divorced    Spouse name: Not on file  . Number of children: Not on file  . Years of education: Not on file  . Highest education level: Not on file  Occupational History  . Not on file  Social Needs  . Financial resource strain: Not on file  . Food insecurity:    Worry: Not on file    Inability: Not on file  . Transportation needs:    Medical: Not on file    Non-medical: Not on file  Tobacco Use  . Smoking status: Never Smoker  . Smokeless tobacco: Never Used  Substance and Sexual Activity  . Alcohol use: Yes    Comment: Occasional drink   . Drug use: No  . Sexual activity: Not Currently  Lifestyle  . Physical activity:    Days per week: Not on file    Minutes per session: Not on file  . Stress: Not on file  Relationships  . Social connections:    Talks on phone: Not on file    Gets together: Not on file    Attends religious service: Not on file    Active member of club or organization: Not on file    Attends meetings of clubs or organizations: Not on file    Relationship status: Not on file  Other Topics Concern  . Not on file  Social History Narrative  . Not on file   Additional Social History: She is married but separated from her husband. She lives at home with her mother. She has a close relationship with her 52, 33 and 52 y/o children. She works at [employer] x 2 years. She denies alcohol or illicit substance use.     Allergies:   Allergies  Allergen Reactions  . Anaprox [Naproxen Sodium] Shortness Of Breath    Heart palpitations, shortness of breath, generalized swelling    Labs:  Results for orders placed or performed during the hospital encounter of 09/22/17 (from the past 48 hour(s))  CBC     Status: None   Collection Time: 09/22/17  3:18 PM  Result Value Ref Range   WBC 6.7 4.0 - 10.5 K/uL   RBC 4.40 3.87 - 5.11  MIL/uL   Hemoglobin 12.4 12.0 - 15.0 g/dL   HCT 36.8 36.0 - 46.0 %   MCV 83.6 78.0 - 100.0 fL   MCH 28.2 26.0 - 34.0 pg   MCHC 33.7 30.0 - 36.0 g/dL   RDW 13.6 11.5 - 15.5 %   Platelets 239 150 - 400 K/uL    Comment: Performed at [facility], [place] [address] [place], [place] [address]  Comprehensive metabolic panel     Status: Abnormal   Collection Time: 09/22/17  3:18 PM  Result Value Ref Range   Sodium 142 135 - 145 mmol/L   Potassium 3.8 3.5 - 5.1 mmol/L   Chloride 109 101 - 111 mmol/L   CO2 24 22 -  32 mmol/L   Glucose, Bld 104 (H) 65 - 99 mg/dL   BUN 6 6 - 20 mg/dL   Creatinine, Ser 0.80 0.44 - 1.00 mg/dL   Calcium 9.0 8.9 - 10.3 mg/dL   Total Protein 7.1 6.5 - 8.1 g/dL   Albumin 3.9 3.5 - 5.0 g/dL   AST 26 15 - 41 U/L   ALT 23 14 - 54 U/L   Alkaline Phosphatase 62 38 - 126 U/L   Total Bilirubin 0.9 0.3 - 1.2 mg/dL   GFR calc non Af Amer >60 >60 mL/min   GFR calc Af Amer >60 >60 mL/min    Comment: (NOTE) The eGFR has been calculated using the CKD EPI equation. This calculation has not been validated in all clinical situations. eGFR's persistently <60 mL/min signify possible Chronic Kidney Disease.    Anion gap 9 5 - 15    Comment: Performed at [facility], [place] [address] [place], [place] [address]  Ethanol     Status: None   Collection Time: 09/22/17  3:18 PM  Result Value Ref Range   Alcohol, Ethyl (B) <10 <10 mg/dL    Comment:        LOWEST DETECTABLE LIMIT FOR SERUM ALCOHOL IS 10 mg/dL FOR MEDICAL PURPOSES ONLY Performed at [facility], [place] [address] [place], [place] [address]   Rapid urine drug screen (hospital performed)     Status: None   Collection Time: 09/22/17  3:18 PM  Result Value Ref Range   Opiates NONE DETECTED NONE DETECTED   Cocaine NONE DETECTED NONE DETECTED   Benzodiazepines NONE DETECTED NONE DETECTED   Amphetamines NONE DETECTED NONE DETECTED   Tetrahydrocannabinol NONE  DETECTED NONE DETECTED   Barbiturates NONE DETECTED NONE DETECTED    Comment: (NOTE) DRUG SCREEN FOR MEDICAL PURPOSES ONLY.  IF CONFIRMATION IS NEEDED FOR ANY PURPOSE, NOTIFY LAB WITHIN 5 DAYS. LOWEST DETECTABLE LIMITS FOR URINE DRUG SCREEN Drug Class                     Cutoff (ng/mL) Amphetamine and metabolites    1000 Barbiturate and metabolites    200 Benzodiazepine                 923 Tricyclics and metabolites     300 Opiates and metabolites        300 Cocaine and metabolites        300 THC                            50 Performed at [facility], [place] [address], [place], [place] [address]   Acetaminophen level     Status: Abnormal   Collection Time: 09/22/17  3:18 PM  Result Value Ref Range   Acetaminophen (Tylenol), Serum <10 (L) 10 - 30 ug/mL    Comment:        THERAPEUTIC CONCENTRATIONS VARY SIGNIFICANTLY. A RANGE OF 10-30 ug/mL MAY BE AN EFFECTIVE CONCENTRATION FOR MANY PATIENTS. HOWEVER, SOME ARE BEST TREATED AT CONCENTRATIONS OUTSIDE THIS RANGE. ACETAMINOPHEN CONCENTRATIONS >150 ug/mL AT 4 HOURS AFTER INGESTION AND >50 ug/mL AT 12 HOURS AFTER INGESTION ARE OFTEN ASSOCIATED WITH TOXIC REACTIONS. Performed at [facility], [place] [address], [place], [place] [address]   Salicylate level     Status: None   Collection Time: 09/22/17  3:18 PM  Result Value Ref Range   Salicylate Lvl <3.5 2.8 -  30.0 mg/dL    Comment: Performed at [facility], [place] [address], [place], [place] [address]  Lithium level     Status: None   Collection Time: 09/22/17  3:18 PM  Result Value Ref Range   Lithium Lvl 0.89 0.60 - 1.20 mmol/L    Comment: Performed at [facility], [place] [address], [place], [place] [address]  Magnesium     Status: None   Collection Time: 09/22/17  3:55 PM  Result Value Ref Range   Magnesium 2.0 1.7 - 2.4 mg/dL    Comment: Performed at [facility], [place] [address], [place], [place] [address]  Lithium level     Status: Abnormal   Collection Time: 09/22/17  8:01 PM  Result Value Ref Range   Lithium Lvl 2.17 (HH) 0.60 - 1.20 mmol/L    Comment: CRITICAL RESULT CALLED TO, READ BACK BY AND VERIFIED WITH: ATTAKINS,LISA AT 2048 ON 09/22/17 BY MOHAMED,A Performed at [facility], [place] [address] [place], [place] [address]   Acetaminophen level     Status: Abnormal   Collection Time: 09/22/17  8:01 PM  Result Value Ref Range   Acetaminophen (Tylenol), Serum <10 (L) 10 - 30 ug/mL    Comment:        THERAPEUTIC CONCENTRATIONS VARY SIGNIFICANTLY. A RANGE OF 10-30 ug/mL MAY BE AN EFFECTIVE CONCENTRATION FOR MANY PATIENTS. HOWEVER, SOME ARE BEST TREATED AT CONCENTRATIONS OUTSIDE THIS RANGE. ACETAMINOPHEN CONCENTRATIONS >150 ug/mL AT 4 HOURS AFTER INGESTION AND >50 ug/mL AT 12 HOURS AFTER INGESTION ARE OFTEN ASSOCIATED WITH TOXIC REACTIONS. Performed at [facility], [place] [address] [place], [place] [address]   Lithium level     Status: Abnormal   Collection Time: 09/22/17 11:52 PM  Result Value Ref Range   Lithium Lvl 1.93 (HH) 0.60 - 1.20 mmol/L    Comment: CRITICAL RESULT CALLED TO, READ BACK BY AND VERIFIED WITH: Elmer Bales AT 0055 ON 09/23/17 BY MOHAMED,A Performed at [facility], [place] [address], [place], [place] [address]   Lithium level     Status: Abnormal   Collection Time: 09/23/17  5:31 AM  Result Value Ref Range   Lithium Lvl 1.40 (H) 0.60 - 1.20 mmol/L    Comment: Performed at [facility] [place] [address], [place], [place] [address]    Current Facility-Administered Medications  Medication Dose Route Frequency Provider Last Rate Last Dose  . 0.9 %  sodium chloride infusion   Intravenous Continuous Etta Quill, DO 125 mL/hr at 09/23/17 0743    . acetaminophen (TYLENOL) tablet 650 mg  650 mg Oral Q6H PRN Etta Quill, DO       Or  . acetaminophen  (TYLENOL) suppository 650 mg  650 mg Rectal Q6H PRN Etta Quill, DO        Musculoskeletal: Strength & Muscle Tone: within normal limits Gait & Station: UTA due to patient lying in bed. Patient leans: N/A  Psychiatric Specialty Exam: Physical Exam  Nursing note and vitals reviewed. Constitutional: She is oriented to person, place, and time. She appears well-developed and well-nourished.  HENT:  Head: Normocephalic and atraumatic.  Neck: Normal range of motion.  Respiratory: Effort normal.  Musculoskeletal: Normal range of motion.  Neurological: She is alert and oriented to person, place, and time.  Skin: No rash noted.  Psychiatric: Her speech is normal and behavior is normal. Thought content normal. Cognition and memory are normal. She expresses impulsivity.  She exhibits a depressed mood.    Review of Systems  Cardiovascular: Negative for chest pain.  Gastrointestinal: Negative for abdominal pain, constipation, diarrhea, nausea and vomiting.  Psychiatric/Behavioral: Positive for depression. Negative for hallucinations, substance abuse and suicidal ideas. The patient has insomnia.   All other systems reviewed and are negative.   Blood pressure 122/71, pulse 65, temperature 98.7 F (37.1 C), temperature source Oral, resp. rate 16, height 5' 7"  (1.702 m), weight 95.3 kg (210 lb), SpO2 97 %.Body mass index is 32.89 kg/m.  General Appearance: Well Groomed, middle aged, Caucasian female who is lying in bed with short blonde hair. NAD.   Eye Contact:  Good  Speech:  Clear and Coherent and Normal Rate  Volume:  Normal  Mood:  Depressed  Affect:  Dysphoric and tearful  Thought Process:  Goal Directed, Linear and Descriptions of Associations: Intact  Orientation:  Full (Time, Place, and Person)  Thought Content:  Logical  Suicidal Thoughts:  No  Homicidal Thoughts:  No  Memory:  Immediate;   Good Recent;   Good Remote;   Good  Judgement:  Fair  Insight:  Fair  Psychomotor  Activity:  Normal  Concentration:  Concentration: Good and Attention Span: Good  Recall:  Good  Fund of Knowledge:  Good  Language:  Good  Akathisia:  No  Handed:  Right  AIMS (if indicated):   N/A  Assets:  Communication Skills Desire for Improvement Financial Resources/Insurance Housing Social Support  ADL's:  Intact  Cognition:  WNL  Sleep:   Poor   Assessment:  Stefanie Galvan is a 52 y.o. female who was admitted with suicide attempt by drug overdose in the setting of an argument with her mother. Lithium level increased to 2.17. She warrants inpatient psychiatric hospitalization for stabilization and treatment given her suicide attempt.    Treatment Plan Summary: -Continue to hold home psychiatric medications until patient is medically stable to resume medications. -Patient warrants inpatient psychiatric hospitalization given high risk of harm to self. -Continue bedside sitter.  -Please pursue involuntary commitment if patient refuses voluntary psychiatric hospitalization or attempts to leave the hospital.  -Will sign off on patient at this time. Please consult psychiatry again as needed.     Disposition: Recommend psychiatric Inpatient admission when medically cleared.  Faythe Dingwall, DO 09/23/2017 12:20 PM

## 2017-09-23 NOTE — Progress Notes (Signed)
[facility] [employer] called. Discussed vitals, tele monitoring, lithium level and patient urinary output. Advised that patient will be continued to be monitored. No further recommendations at this time. RN will continue to monitor.

## 2017-09-23 NOTE — Progress Notes (Signed)
CRITICAL VALUE ALERT  Critical Value:  Lithium 1.93  Date & Time Notied:  09/23/17 0116am  Provider Notified: NP Blount notified   Orders Received/Actions taken: awaiting callback

## 2017-09-23 NOTE — Progress Notes (Signed)
PROGRESS NOTE    Stefanie Galvan  ZOX:[id number] DOB: 01/22/1966 DOA: 09/22/2017 PCP: Alain HoneyHerber, Richard, MD     Brief Narrative:   52 year old with past medical history relevant for bipolar disorder, anxiety, depression who comes in after self-harm attempt and taking overdose of her lithium.   Assessment & Plan:   Principal Problem:   Lithium overdose, intentional self-harm, initial encounter [facility](HCC) Active Problems:   Bipolar 1 disorder, depressed (HCC)   QT prolongation   #) Intentional lithium overdose: Her level while initially spiking his come down quite nicely.  She was not prescribed extended release tablets and reports only taking 10.  Poison control has been contacted and recommends continued monitoring. -IV fluids -We will restart home lithium once level falls below 1 - Hold home escitalopram 10 mg nightly at this time -Telemetry -Psychiatry consult for need for inpatient evaluation/treatment - Sitter at bedside  #)prolonged QT: Noted to have QT that ranges in the 490s-500s.  Has not appreciably prolonged with lithium overdose. -Telemetry  #) Anxiety/depression: - Restart home hydroxyzine 25 mg to 50 mg every 6 hours as needed  Fluids: IV fluids Elect lites: Monitor and supplement Exline nutrition: Regular diet  Prophylaxis: Enoxaparin  Disposition: Pending psychiatry recommendations  Full code    Consultants:   Psychiatry  Procedures: (Don't include imaging studies which can be auto populated. Include things that cannot be auto populated i.e. Echo, Carotid and venous dopplers, Foley, Bipap, HD, tubes/drains, wound vac, central lines etc)  None  Antimicrobials: (specify start and planned stop date. Auto populated tables are space occupying and do not give end dates)  None   Subjective: Patient reports that she apparently lives in the basement of her mom's house.  She had a conflict with her mom and because of numerous stresses in her life and the  feeling that she did not have anyone to talk to she overdosed on her medications.  She apparently threatened her mom to call 911 and her mom reported that she would not.  Objective: Vitals:   09/23/17 0000 09/23/17 0030 09/23/17 0107 09/23/17 0659  BP: 109/71 100/66 115/66 122/71  Pulse: 63 61 (!) 59 65  Resp: 15 16 17 16   Temp:   98.2 F (36.8 C) 98.7 F (37.1 C)  TempSrc:   Oral Oral  SpO2: 96% 95% 97% 97%  Weight:      Height:        Intake/Output Summary (Last 24 hours) at 09/23/2017 1353 Last data filed at 09/23/2017 1112 Gross per 24 hour  Intake 14744.17 ml  Output 1450 ml  Net 13294.17 ml   Filed Weights   09/22/17 1507  Weight: 95.3 kg (210 lb)    Examination:  General exam: Easily distressed Respiratory system: Clear to auscultation. Respiratory effort normal. Cardiovascular system: Regular rate and rhythm, no murmurs Gastrointestinal system: Abdomen is nondistended, soft and nontender. No organomegaly or masses felt. Normal bowel sounds heard. Central nervous system: Alert and oriented. No focal neurological deficits. Extremities: Trace lower extremity edema Skin: No rashes visible skin Psychiatry: Judgement and insight appear fairly poor.  She has few coping skills and no social circle.  Mood & affect depressed.    Data Reviewed: I have personally reviewed following labs and imaging studies  CBC: Recent Labs  Lab 09/22/17 1518  WBC 6.7  HGB 12.4  HCT 36.8  MCV 83.6  PLT 239   Basic Metabolic Panel: Recent Labs  Lab 09/22/17 1518 09/22/17 1555  NA 142  --  K 3.8  --   CL 109  --   CO2 24  --   GLUCOSE 104*  --   BUN 6  --   CREATININE 0.80  --   CALCIUM 9.0  --   MG  --  2.0   GFR: Estimated Creatinine Clearance: 98.6 mL/min (by C-G formula based on SCr of 0.8 mg/dL). Liver Function Tests: Recent Labs  Lab 09/22/17 1518  AST 26  ALT 23  ALKPHOS 62  BILITOT 0.9  PROT 7.1  ALBUMIN 3.9   No results for input(s): LIPASE, AMYLASE in  the last 168 hours. No results for input(s): AMMONIA in the last 168 hours. Coagulation Profile: No results for input(s): INR, PROTIME in the last 168 hours. Cardiac Enzymes: No results for input(s): CKTOTAL, CKMB, CKMBINDEX, TROPONINI in the last 168 hours. BNP (last 3 results) No results for input(s): PROBNP in the last 8760 hours. HbA1C: No results for input(s): HGBA1C in the last 72 hours. CBG: No results for input(s): GLUCAP in the last 168 hours. Lipid Profile: No results for input(s): CHOL, HDL, LDLCALC, TRIG, CHOLHDL, LDLDIRECT in the last 72 hours. Thyroid Function Tests: No results for input(s): TSH, T4TOTAL, FREET4, T3FREE, THYROIDAB in the last 72 hours. Anemia Panel: No results for input(s): VITAMINB12, FOLATE, FERRITIN, TIBC, IRON, RETICCTPCT in the last 72 hours. Sepsis Labs: No results for input(s): PROCALCITON, LATICACIDVEN in the last 168 hours.  No results found for this or any previous visit (from the past 240 hour(s)).       Radiology Studies: No results found.      Scheduled Meds: Continuous Infusions: . sodium chloride 125 mL/hr at 09/23/17 0743     LOS: 0 days    Time spent: 30    Delaine Lame, MD [facility]  If 7PM-7AM, please contact night-coverage [email or web address] Password [email or web address] 09/23/2017, 1:53 PM

## 2017-09-23 NOTE — Progress Notes (Signed)
Patient belongings placed in clean utility area. Bag is labeled with patient label. Patient has pair of earrings in ears. Patient has one shirt in bag and one small bag that patient reports has no money in bag and reports only her cell phone as valuable. SI sitter VictoriaBrooklyn verified belongings in bag as RN removed with patient and placed back into bag and bag is tied and advised patient that will be placed into secured area for belongings until discharge. Patient verbalized understanding.

## 2017-09-23 NOTE — Progress Notes (Signed)
Phone call from poison control to check on patient and update. Vital signs provided, labs update and overall status. Poison control states patient seems to be at baseline with telemetry readings back in range and will sign off at this time. Will c/t monitor.

## 2017-09-24 ENCOUNTER — Inpatient Hospital Stay (HOSPITAL_COMMUNITY)
Admission: AD | Admit: 2017-09-24 | Discharge: 2017-09-28 | DRG: 885 | Disposition: A | Discharge status: Home / Self Care | Payer: PRIVATE HEALTH INSURANCE | Source: Intra-hospital | Attending: Psychiatry | Admitting: Psychiatry

## 2017-09-24 ENCOUNTER — Other Ambulatory Visit: Payer: Self-pay

## 2017-09-24 ENCOUNTER — Encounter (HOSPITAL_COMMUNITY): Payer: Self-pay

## 2017-09-24 DIAGNOSIS — F419 Anxiety disorder, unspecified: Secondary | ICD-10-CM | POA: Diagnosis not present

## 2017-09-24 DIAGNOSIS — F314 Bipolar disorder, current episode depressed, severe, without psychotic features: Secondary | ICD-10-CM | POA: Diagnosis not present

## 2017-09-24 DIAGNOSIS — F4312 Post-traumatic stress disorder, chronic: Secondary | ICD-10-CM | POA: Diagnosis present

## 2017-09-24 DIAGNOSIS — F4325 Adjustment disorder with mixed disturbance of emotions and conduct: Secondary | ICD-10-CM | POA: Diagnosis present

## 2017-09-24 DIAGNOSIS — Z62811 Personal history of psychological abuse in childhood: Secondary | ICD-10-CM | POA: Diagnosis not present

## 2017-09-24 DIAGNOSIS — T56892A Toxic effect of other metals, intentional self-harm, initial encounter: Secondary | ICD-10-CM | POA: Diagnosis not present

## 2017-09-24 DIAGNOSIS — T1491XA Suicide attempt, initial encounter: Secondary | ICD-10-CM | POA: Diagnosis not present

## 2017-09-24 DIAGNOSIS — Z87820 Personal history of traumatic brain injury: Secondary | ICD-10-CM

## 2017-09-24 DIAGNOSIS — F3131 Bipolar disorder, current episode depressed, mild: Secondary | ICD-10-CM

## 2017-09-24 DIAGNOSIS — J40 Bronchitis, not specified as acute or chronic: Secondary | ICD-10-CM | POA: Diagnosis present

## 2017-09-24 DIAGNOSIS — M1990 Unspecified osteoarthritis, unspecified site: Secondary | ICD-10-CM | POA: Diagnosis present

## 2017-09-24 DIAGNOSIS — F319 Bipolar disorder, unspecified: Secondary | ICD-10-CM | POA: Diagnosis present

## 2017-09-24 DIAGNOSIS — Z62810 Personal history of physical and sexual abuse in childhood: Secondary | ICD-10-CM | POA: Diagnosis not present

## 2017-09-24 DIAGNOSIS — Z818 Family history of other mental and behavioral disorders: Secondary | ICD-10-CM

## 2017-09-24 DIAGNOSIS — Z62820 Parent-biological child conflict: Secondary | ICD-10-CM | POA: Diagnosis not present

## 2017-09-24 DIAGNOSIS — Z91411 Personal history of adult psychological abuse: Secondary | ICD-10-CM | POA: Diagnosis not present

## 2017-09-24 DIAGNOSIS — Z888 Allergy status to other drugs, medicaments and biological substances status: Secondary | ICD-10-CM

## 2017-09-24 DIAGNOSIS — Z91410 Personal history of adult physical and sexual abuse: Secondary | ICD-10-CM | POA: Diagnosis not present

## 2017-09-24 LAB — LITHIUM LEVEL: Lithium Lvl: 0.6 mmol/L (ref 0.60–1.20)

## 2017-09-24 LAB — TSH: TSH: 5.925 u[IU]/mL — ABNORMAL HIGH (ref 0.350–4.500)

## 2017-09-24 MED ORDER — TRAZODONE HCL 50 MG PO TABS
50.0000 mg | ORAL_TABLET | Freq: Every evening | ORAL | Status: DC | PRN
  Filled 2017-09-24 (×2): qty 1

## 2017-09-24 MED ORDER — BENZONATATE 100 MG PO CAPS
100.0000 mg | ORAL_CAPSULE | Freq: Three times a day (TID) | ORAL | Status: DC | PRN
  Administered 2017-09-24: 100 mg via ORAL
  Filled 2017-09-24: qty 1

## 2017-09-24 MED ORDER — ESCITALOPRAM OXALATE 10 MG PO TABS: 10.0000 mg | ORAL_TABLET | Freq: Every day | ORAL | Status: DC

## 2017-09-24 MED ORDER — ENOXAPARIN SODIUM 40 MG/0.4ML ~~LOC~~ SOLN: 40.0000 mg | SUBCUTANEOUS | Status: DC

## 2017-09-24 MED ORDER — TRAZODONE HCL 50 MG PO TABS: 50.0000 mg | ORAL_TABLET | Freq: Every evening | ORAL | Status: DC | PRN

## 2017-09-24 MED ORDER — MAGNESIUM HYDROXIDE 400 MG/5ML PO SUSP: 30.0000 mL | Freq: Every day | ORAL | Status: DC | PRN

## 2017-09-24 MED ORDER — ESCITALOPRAM OXALATE 10 MG PO TABS
10.0000 mg | ORAL_TABLET | Freq: Every day | ORAL | Status: DC
  Administered 2017-09-24 – 2017-09-27 (×4): 10 mg via ORAL
  Filled 2017-09-24 (×6): qty 1

## 2017-09-24 MED ORDER — ALUM & MAG HYDROXIDE-SIMETH 200-200-20 MG/5ML PO SUSP: 30.0000 mL | ORAL | Status: DC | PRN

## 2017-09-24 MED ORDER — ENOXAPARIN SODIUM 40 MG/0.4ML ~~LOC~~ SOLN
40.0000 mg | SUBCUTANEOUS | Status: DC
  Filled 2017-09-24: qty 0.4

## 2017-09-24 MED ORDER — BENZONATATE 100 MG PO CAPS
100.0000 mg | ORAL_CAPSULE | Freq: Three times a day (TID) | ORAL | Status: DC | PRN
Start: 2017-09-24 — End: 2017-09-28
  Administered 2017-09-25 – 2017-09-27 (×5): 100 mg via ORAL
  Filled 2017-09-24 (×6): qty 1

## 2017-09-24 NOTE — Progress Notes (Signed)
Stefanie BradfordKimberly is a 52 year old female being admitted voluntarily to [facility] from [facility]med floor after intentional OD on Lithium and trazodone.  She reported argument with her mother who is not care and harsh.  "She is my trigger."  [facility] she was tearful but pleasant.  She stated that she does not get along with her mother and is currently living with her.  "I have to find somewhere else to live."  She denies SI/HI or A/V hallucinations.  She stated that she has occasionally seen animals that are around her but are not real.  She denies any current visual hallucinations.  Oriented her to the unit.  Admission paperwork completed and signed.  Belongings searched and secured in locker # 18, no contraband found.  Skin assessment completed and no skin issues noted.  Q 15 minute checks initiated for safety.  We will continue to monitor the progress towards her goals.

## 2017-09-24 NOTE — Progress Notes (Signed)
The patient attended group but refused to share.  

## 2017-09-24 NOTE — Progress Notes (Addendum)
[facility] bed 1.  Patient can transport at 18:30pm. Patient will transport by [facility].   [facility] set up at 15:03  Attending: Sharma CovertNorman  Accepting: Tamera Puntleary  RN report number: [phone]: [phone]  Please call report before patient leaves the facility.    BKJ

## 2017-09-24 NOTE — Progress Notes (Signed)
Provided support for pt on 5 east.   Familiar with patient from Partial hospitalization program.  Will continue to follow [facility].    [facility] Chaplain Burnis KingfisherMatthew Darius Lundberg, MDiv, [facility]

## 2017-09-24 NOTE — Clinical Social Work Note (Signed)
Clinical Social Work Assessment  Patient Details  Name: Stefanie Galvan MRN: [id number] Date of Birth: 1966-04-27  Date of referral:  09/24/17               Reason for consult:                   Permission sought to share information with:  Chartered certified accountant granted to share information::  Yes, Verbal Permission Granted  Name::        Agency::  Psych Facilities  Relationship::     Contact Information:     Housing/Transportation Living arrangements for the past 2 months:  Single Family Home Source of Information:  Patient Patient Interpreter Needed:  None Criminal Activity/Legal Involvement Pertinent to Current Situation/Hospitalization:  No - Comment as needed Significant Relationships:  Other(Comment)(Co workers) Lives with:  Parents, Siblings Do you feel safe going back to the place where you live?  No Need for family participation in patient care:  Yes (Comment)  Care giving concerns:  Patient reports she lives in her mothers basement. Patient reports that he mother is her trigger.    Social Worker assessment / plan: LCSW consulted for inpatient psych placement.   LCSW met with patient at bedside. Patient has sitter. No family present.   Patient admitted for intentional suicide.   Patient reports that a disagreement with her mother led to her most recent attempt. Patient reports that her mother is her trigger and really wants her mother to attend therapy or a support group to understand her depression.   Patient reports that she lives in her mothers basement and she pays $100 rent. Herbrother also lives in the home. Patient works full time and has 3 adult children.  Housing is a concern for patient and she does not feel that she can return to her mothers home. Patient requested affordable housing resources.   Patient is followed by Dr. Rhea Belton, Psychiatrist in the community and a therapist at [facility]. Patient reports she has not seen her  therapist is 3-4 weeks due to facility canceling appointments. she usually goes to therapy biweekly.   Patient reports she has been hospitalized 5x this year. She has been hospitalized a total of 12 times at various hospitals in the state.   PLAN: [facility]. LCSW provided housing resources.   Employment status:  [employer] information:  Managed Care PT Recommendations:  Not assessed at this time Information / Referral to community resources:     Patient/Family's Response to care:  Patient is thankful for LCSW visit and resources.   Patient/Family's Understanding of and Emotional Response to Diagnosis, Current Treatment, and Prognosis:  Patient is understanding of diagnosis and agreeable to inpatient psych at dc. Patient reports that she needs family support. Patient wishes her mother was more supportive and understanding.   Emotional Assessment Appearance:  Appears stated age Attitude/Demeanor/Rapport:    Affect (typically observed):  Depressed, Tearful/Crying, Overwhelmed Orientation:  Oriented to Self, Oriented to Place, Oriented to  Time, Oriented to Situation Alcohol / Substance use:  Not Applicable Psych involvement (Current and /or in the community):  Outpatient Provider  Discharge Needs  Concerns to be addressed:  No discharge needs identified Readmission within the last 30 days:  No Current discharge risk:  None Barriers to Discharge:  No Barriers Identified   Servando Snare, LCSW 09/24/2017, 2:38 PM

## 2017-09-24 NOTE — Progress Notes (Signed)
PROGRESS NOTE    Stefanie Galvan  ZOX:[id number] DOB: Mar 08, 1966 DOA: 09/22/2017 PCP: Alain Honey, MD     Brief Narrative:   52 year old with past medical history relevant for bipolar disorder, anxiety, depression who comes in after self-harm attempt and taking overdose of her lithium.   Assessment & Plan:   Principal Problem:   Adjustment disorder with mixed disturbance of emotions and conduct Active Problems:   Bipolar 1 disorder, depressed (HCC)   Lithium overdose, intentional self-harm, initial encounter (HCC)   QT prolongation   Lithium overdose   #) Intentional lithium overdose: Levels of down trended to normal. -IV fluids discontinued -Continue to hold home lithium dose pending inpatient psych placement -Continue escitalopram 10 mg nightly -Telemetry discontinue -Psychiatry consult recommends inpatient psych -Social work consult for inpatient psych placement - Sitter at bedside  #)prolonged QT: At baseline currently -Telemetry discontinue  #) Anxiety/depression: -Continue hydroxyzine 25 mg to 50 mg every 6 hours as needed  Fluids: Tolerating p.o. Elect lites: Monitor and supplement  nutrition: Regular diet  Prophylaxis: Enoxaparin  Disposition: Pending inpatient psych placement  Full code    Consultants:   Psychiatry  Procedures: (Don't include imaging studies which can be auto populated. Include things that cannot be auto populated i.e. Echo, Carotid and venous dopplers, Foley, Bipap, HD, tubes/drains, wound vac, central lines etc)  None  Antimicrobials: (specify start and planned stop date. Auto populated tables are space occupying and do not give end dates)  None   Subjective: Patient reports that she received a good night sleep and her mood is improved.  She is eager for inpatient psychiatry placement to see if she might benefit from having her mother involved so that she can understand how she makes the patient  feel.  Objective: Vitals:   09/23/17 0659 09/23/17 1439 09/23/17 2025 09/24/17 0555  BP: 122/71 108/60 119/71 122/75  Pulse: 65 72 68 60  Resp: 16  18 18   Temp: 98.7 F (37.1 C)  98.9 F (37.2 C) 98.1 F (36.7 C)  TempSrc: Oral  Oral Oral  SpO2: 97% 97% 96% 96%  Weight:      Height:        Intake/Output Summary (Last 24 hours) at 09/24/2017 1133 Last data filed at 09/24/2017 0800 Gross per 24 hour  Intake 2715.42 ml  Output 2600 ml  Net 115.42 ml   Filed Weights   09/22/17 1507  Weight: 95.3 kg (210 lb)    Examination:  General exam: No acute distress Respiratory system: Clear to auscultation. Respiratory effort normal. Cardiovascular system: Regular rate and rhythm, no murmurs Gastrointestinal system: Abdomen is nondistended, soft and nontender. No organomegaly or masses felt. Normal bowel sounds heard. Central nervous system: Alert and oriented. No focal neurological deficits. Extremities: Trace lower extremity edema Skin: No rashes visible skin Psychiatry: Judgement and insight appear fairly poor.  Mood and affect have improved.    Data Reviewed: I have personally reviewed following labs and imaging studies  CBC: Recent Labs  Lab 09/22/17 1518  WBC 6.7  HGB 12.4  HCT 36.8  MCV 83.6  PLT 239   Basic Metabolic Panel: Recent Labs  Lab 09/22/17 1518 09/22/17 1555  NA 142  --   K 3.8  --   CL 109  --   CO2 24  --   GLUCOSE 104*  --   BUN 6  --   CREATININE 0.80  --   CALCIUM 9.0  --   MG  --  2.0  GFR: Estimated Creatinine Clearance: 98.6 mL/min (by C-G formula based on SCr of 0.8 mg/dL). Liver Function Tests: Recent Labs  Lab 09/22/17 1518  AST 26  ALT 23  ALKPHOS 62  BILITOT 0.9  PROT 7.1  ALBUMIN 3.9   No results for input(s): LIPASE, AMYLASE in the last 168 hours. No results for input(s): AMMONIA in the last 168 hours. Coagulation Profile: No results for input(s): INR, PROTIME in the last 168 hours. Cardiac Enzymes: No results for  input(s): CKTOTAL, CKMB, CKMBINDEX, TROPONINI in the last 168 hours. BNP (last 3 results) No results for input(s): PROBNP in the last 8760 hours. HbA1C: No results for input(s): HGBA1C in the last 72 hours. CBG: No results for input(s): GLUCAP in the last 168 hours. Lipid Profile: No results for input(s): CHOL, HDL, LDLCALC, TRIG, CHOLHDL, LDLDIRECT in the last 72 hours. Thyroid Function Tests: Recent Labs    09/24/17 0605  TSH 5.925*   Anemia Panel: No results for input(s): VITAMINB12, FOLATE, FERRITIN, TIBC, IRON, RETICCTPCT in the last 72 hours. Sepsis Labs: No results for input(s): PROCALCITON, LATICACIDVEN in the last 168 hours.  No results found for this or any previous visit (from the past 240 hour(s)).       Radiology Studies: No results found.      Scheduled Meds: . enoxaparin (LOVENOX) injection  40 mg Subcutaneous Q24H  . escitalopram  10 mg Oral QHS   Continuous Infusions:    LOS: 1 day    Time spent: 30    Delaine LameShrey C Mccormick Macon, MD [facility]  If 7PM-7AM, please contact night-coverage [email or web address] Password [email or web address] 09/24/2017, 11:33 AM

## 2017-09-24 NOTE — Tx Team (Signed)
Initial Treatment Plan 09/24/2017 7:47 PM Stefanie CritchleyKimberly D Sher ZOX:[id number]    PATIENT STRESSORS: Marital or family conflict Traumatic event   PATIENT STRENGTHS: MetallurgistCommunication skills Financial means General fund of knowledge Physical Health Work skills   PATIENT IDENTIFIED PROBLEMS: Depression  Suicidal ideation/attempt  "Get out of my mother's house"  "Stay out of my mom's house"  "Never come back to the hospital"             DISCHARGE CRITERIA:  Improved stabilization in mood, thinking, and/or behavior Safe-care adequate arrangements made  PRELIMINARY DISCHARGE PLAN: Outpatient therapy Medication management  PATIENT/FAMILY INVOLVEMENT: This treatment plan has been presented to and reviewed with the patient, Stefanie Galvan.  The patient and family have been given the opportunity to ask questions and make suggestions.  Levin BaconHeather V Tonio Seider, RN 09/24/2017, 7:47 PM

## 2017-09-24 NOTE — Progress Notes (Signed)
Called gave report to RN. [facility] to transport patient. Patient belongings given at discharge.

## 2017-09-24 NOTE — Discharge Summary (Signed)
Physician Discharge Summary  Stefanie Galvan ZOX:[id number] DOB: Feb 14, 1966 DOA: 09/22/2017  PCP: Alain Honey, MD  Admit date: 09/22/2017 Discharge date: 09/24/2017  Admitted From: Home Disposition: Psychiatric hospital  Recommendations for Outpatient Follow-up:  1. Follow up with PCP in 1-2 weeks 2. Please obtain BMP/CBC in one week 3. Please discuss with your psychiatrist on when to restart her lithium.  Home Health: None Equipment/Devices: None  Discharge Condition: Stable CODE STATUS: Full Diet recommendation: Regular  Brief/Interim Summary:  #) Intentional lithium overdose: Patient took approximately 10 300 mg lithium tablets in a suicide attempt after getting into an argument with her mom.  On presentation her lithium levels were within normal range.  It then trended up to a peak of 2.17.  On recheck on discharge her lithium level was 0.6.  She was given aggressive IV fluids.  Poison control was contacted and recommended monitoring and IV fluids.  Patient's lithium was held throughout the stay.  She was monitored on telemetry with no abnormal arrhythmias.  The decision to restart her lithium was deferred to her psychiatrist at the inpatient psychiatric hospital.  Her escitalopram 10 mg was restarted on discharge.  She had a sitter during her hospitalization.    #) mildly prolonged QTC: Patient was noted to have QTC that was around 500-510 intermittently.  This was monitored.  She did not have any arrhythmias.  Her QTC should be monitored routinely when she is either starting or increasing doses of her lithium.  Discharge Diagnoses:  Principal Problem:   Adjustment disorder with mixed disturbance of emotions and conduct Active Problems:   Bipolar 1 disorder, depressed (HCC)   Lithium overdose, intentional self-harm, initial encounter (HCC)   QT prolongation   Lithium overdose    Discharge Instructions  Discharge Instructions    Call MD for:  difficulty breathing,  headache or visual disturbances   Complete by:  As directed    Call MD for:  hives   Complete by:  As directed    Call MD for:  persistant dizziness or light-headedness   Complete by:  As directed    Call MD for:  persistant nausea and vomiting   Complete by:  As directed    Call MD for:  redness, tenderness, or signs of infection (pain, swelling, redness, odor or green/yellow discharge around incision site)   Complete by:  As directed    Call MD for:  severe uncontrolled pain   Complete by:  As directed    Call MD for:  temperature >100.4   Complete by:  As directed    Diet - low sodium heart healthy   Complete by:  As directed    Discharge instructions   Complete by:  As directed    Please follow-up with your PCP 1 week on discharge.  Please discuss with your psychiatrist about when to restart her lithium doses.   Increase activity slowly   Complete by:  As directed      Allergies as of 09/24/2017      Reactions   Anaprox [naproxen Sodium] Shortness Of Breath   Heart palpitations, shortness of breath, generalized swelling      Medication List    STOP taking these medications   lithium carbonate 300 MG capsule     TAKE these medications   escitalopram 10 MG tablet Commonly known as:  LEXAPRO Take 1 tablet (10 mg total) by mouth at bedtime. For mood control   hydrOXYzine 25 MG tablet Commonly known as:  ATARAX/VISTARIL Take 1-2 capsule as needed for anxiety   traZODone 100 MG tablet Commonly known as:  DESYREL Take 1/2 to 1 tab as needed for insomnia       Allergies  Allergen Reactions  . Anaprox [Naproxen Sodium] Shortness Of Breath    Heart palpitations, shortness of breath, generalized swelling    Consultations:  Psychiatry   Procedures/Studies:  No results found.    Subjective:   Discharge Exam: Vitals:   09/23/17 2025 09/24/17 0555  BP: 119/71 122/75  Pulse: 68 60  Resp: 18 18  Temp: 98.9 F (37.2 C) 98.1 F (36.7 C)  SpO2: 96% 96%    Vitals:   09/23/17 0659 09/23/17 1439 09/23/17 2025 09/24/17 0555  BP: 122/71 108/60 119/71 122/75  Pulse: 65 72 68 60  Resp: 16  18 18   Temp: 98.7 F (37.1 C)  98.9 F (37.2 C) 98.1 F (36.7 C)  TempSrc: Oral  Oral Oral  SpO2: 97% 97% 96% 96%  Weight:      Height:        General: Pt is alert, awake, not in acute distress Cardiovascular: RRR, S1/S2 +, no rubs, no gallops Respiratory: CTA bilaterally, no wheezing, no rhonchi Abdominal: Soft, NT, ND, bowel sounds + Extremities: no edema    The results of significant diagnostics from this hospitalization (including imaging, microbiology, ancillary and laboratory) are listed below for reference.     Microbiology: No results found for this or any previous visit (from the past 240 hour(s)).   Labs: BNP (last 3 results) No results for input(s): BNP in the last 8760 hours. Basic Metabolic Panel: Recent Labs  Lab 09/22/17 1518 09/22/17 1555  NA 142  --   K 3.8  --   CL 109  --   CO2 24  --   GLUCOSE 104*  --   BUN 6  --   CREATININE 0.80  --   CALCIUM 9.0  --   MG  --  2.0   Liver Function Tests: Recent Labs  Lab 09/22/17 1518  AST 26  ALT 23  ALKPHOS 62  BILITOT 0.9  PROT 7.1  ALBUMIN 3.9   No results for input(s): LIPASE, AMYLASE in the last 168 hours. No results for input(s): AMMONIA in the last 168 hours. CBC: Recent Labs  Lab 09/22/17 1518  WBC 6.7  HGB 12.4  HCT 36.8  MCV 83.6  PLT 239   Cardiac Enzymes: No results for input(s): CKTOTAL, CKMB, CKMBINDEX, TROPONINI in the last 168 hours. BNP: Invalid input(s): POCBNP CBG: No results for input(s): GLUCAP in the last 168 hours. D-Dimer No results for input(s): DDIMER in the last 72 hours. Hgb A1c No results for input(s): HGBA1C in the last 72 hours. Lipid Profile No results for input(s): CHOL, HDL, LDLCALC, TRIG, CHOLHDL, LDLDIRECT in the last 72 hours. Thyroid function studies Recent Labs    09/24/17 0605  TSH 5.925*   Anemia work  up No results for input(s): VITAMINB12, FOLATE, FERRITIN, TIBC, IRON, RETICCTPCT in the last 72 hours. Urinalysis    Component Value Date/Time   COLORURINE YELLOW 03/19/2017 1705   APPEARANCEUR HAZY (A) 03/19/2017 1705   LABSPEC 1.021 03/19/2017 1705   PHURINE 8.0 03/19/2017 1705   GLUCOSEU NEGATIVE 03/19/2017 1705   HGBUR NEGATIVE 03/19/2017 1705   BILIRUBINUR NEGATIVE 03/19/2017 1705   KETONESUR NEGATIVE 03/19/2017 1705   PROTEINUR 30 (A) 03/19/2017 1705   UROBILINOGEN 0.2 09/12/2008 1641   NITRITE NEGATIVE 03/19/2017 1705   LEUKOCYTESUR TRACE (  A) 03/19/2017 1705   Sepsis Labs Invalid input(s): PROCALCITONIN,  WBC,  LACTICIDVEN Microbiology No results found for this or any previous visit (from the past 240 hour(s)).   Time coordinating discharge: Over 30 minutes  SIGNED:   Delaine Lame, MD  [facility] 09/24/2017, 2:15 PM   If 7PM-7AM, please contact night-coverage [email or web address] Password [email or web address]

## 2017-09-25 DIAGNOSIS — Z62820 Parent-biological child conflict: Secondary | ICD-10-CM

## 2017-09-25 DIAGNOSIS — Z91411 Personal history of adult psychological abuse: Secondary | ICD-10-CM

## 2017-09-25 DIAGNOSIS — T1491XA Suicide attempt, initial encounter: Secondary | ICD-10-CM

## 2017-09-25 DIAGNOSIS — Z91410 Personal history of adult physical and sexual abuse: Secondary | ICD-10-CM

## 2017-09-25 DIAGNOSIS — F419 Anxiety disorder, unspecified: Secondary | ICD-10-CM

## 2017-09-25 DIAGNOSIS — Z62811 Personal history of psychological abuse in childhood: Secondary | ICD-10-CM

## 2017-09-25 DIAGNOSIS — Z62810 Personal history of physical and sexual abuse in childhood: Secondary | ICD-10-CM

## 2017-09-25 DIAGNOSIS — Z818 Family history of other mental and behavioral disorders: Secondary | ICD-10-CM

## 2017-09-25 DIAGNOSIS — T56892A Toxic effect of other metals, intentional self-harm, initial encounter: Secondary | ICD-10-CM

## 2017-09-25 DIAGNOSIS — F314 Bipolar disorder, current episode depressed, severe, without psychotic features: Secondary | ICD-10-CM

## 2017-09-25 MED ORDER — ACETAMINOPHEN 325 MG PO TABS
650.0000 mg | ORAL_TABLET | Freq: Four times a day (QID) | ORAL | Status: DC | PRN
  Administered 2017-09-25 – 2017-09-27 (×3): 650 mg via ORAL
  Filled 2017-09-25 (×3): qty 2

## 2017-09-25 MED ORDER — LAMOTRIGINE 25 MG PO TABS
25.0000 mg | ORAL_TABLET | Freq: Two times a day (BID) | ORAL | Status: DC
Start: 2017-09-25 — End: 2017-09-28
  Administered 2017-09-25 – 2017-09-28 (×6): 25 mg via ORAL
  Filled 2017-09-25 (×9): qty 1

## 2017-09-25 NOTE — Plan of Care (Signed)
Problem: Activity: Goal: Interest or engagement in activities will improve Intervention: Patient has been provided a daily scheduled and is encouraged by staff to attend groups. Outcome: Patient is visible on the unit, interactive with peers/staff and attending groups. 09/25/2017 9:40 AM - Progressing by Ferrel Loganollazo, Ulyess Muto A, RN     Problem: Safety: Goal: Periods of time without injury will increase Intervention: Patient contracts for safety on the unit. Low fall risk precautions in place. Safety monitored with q15 minute checks. Outcome: Patient remains safe on the unit at this time. 09/25/2017 9:40 AM - Progressing by Ferrel Loganollazo, Girtie Wiersma A, RN

## 2017-09-25 NOTE — Tx Team (Signed)
Interdisciplinary Treatment and Diagnostic Plan Update  09/25/2017 Time of Session: 1:15pm Stefanie Galvan MRN: [id number]  Principal Diagnosis: <principal problem not specified>  Secondary Diagnoses: Active Problems:   Bipolar affective (HCC)   Current Medications:  Current Facility-Administered Medications  Medication Dose Route Frequency Provider Last Rate Last Dose  . acetaminophen (TYLENOL) tablet 650 mg  650 mg Oral Q6H PRN Antonieta Pertlary, Greg Lawson, MD   650 mg at 09/25/17 1409  . alum & mag hydroxide-simeth (MAALOX/MYLANTA) 200-200-20 MG/5ML suspension 30 mL  30 mL Oral Q4H PRN Rankin, Shuvon B, NP      . benzonatate (TESSALON) capsule 100 mg  100 mg Oral Q8H PRN Rankin, Shuvon B, NP   100 mg at 09/25/17 [id number]  . escitalopram (LEXAPRO) tablet 10 mg  10 mg Oral QHS Rankin, Shuvon B, NP   10 mg at 09/24/17 2127  . lamoTRIgine (LAMICTAL) tablet 25 mg  25 mg Oral BID Antonieta Pertlary, Greg Lawson, MD   25 mg at 09/25/17 1214  . magnesium hydroxide (MILK OF MAGNESIA) suspension 30 mL  30 mL Oral Daily PRN Rankin, Shuvon B, NP      . traZODone (DESYREL) tablet 50 mg  50 mg Oral QHS PRN Rankin, Shuvon B, NP       PTA Medications: Medications Prior to Admission  Medication Sig Dispense Refill Last Dose  . escitalopram (LEXAPRO) 10 MG tablet Take 1 tablet (10 mg total) by mouth at bedtime. For mood control 30 tablet 2 09/21/2017 at Unknown time  . hydrOXYzine (ATARAX/VISTARIL) 25 MG tablet Take 1-2 capsule as needed for anxiety (Patient not taking: Reported on 09/22/2017) 60 tablet 1 Not Taking at Unknown time  . traZODone (DESYREL) 100 MG tablet Take 1/2 to 1 tab as needed for insomnia 30 tablet 2 09/22/2017 at Unknown time    Patient Stressors: Marital or family conflict Traumatic event  Patient Strengths: MetallurgistCommunication skills Financial means General fund of knowledge Physical Health Work skills  Treatment Modalities: Medication Management, Group therapy, Case management,  1 to 1 session with  clinician, Psychoeducation, Recreational therapy.   Physician Treatment Plan for Primary Diagnosis: <principal problem not specified> Long Term Goal(s): Improvement in symptoms so as ready for discharge Improvement in symptoms so as ready for discharge   Short Term Goals: Ability to identify changes in lifestyle to reduce recurrence of condition will improve Ability to verbalize feelings will improve Ability to disclose and discuss suicidal ideas Ability to demonstrate self-control will improve Ability to identify and develop effective coping behaviors will improve Ability to maintain clinical measurements within normal limits will improve Compliance with prescribed medications will improve Ability to identify changes in lifestyle to reduce recurrence of condition will improve Ability to verbalize feelings will improve Ability to disclose and discuss suicidal ideas Ability to demonstrate self-control will improve Ability to identify and develop effective coping behaviors will improve Ability to maintain clinical measurements within normal limits will improve Compliance with prescribed medications will improve  Medication Management: Evaluate patient's response, side effects, and tolerance of medication regimen.  Therapeutic Interventions: 1 to 1 sessions, Unit Group sessions and Medication administration.  Evaluation of Outcomes: Progressing  Physician Treatment Plan for Secondary Diagnosis: Active Problems:   Bipolar affective (HCC)  Long Term Goal(s): Improvement in symptoms so as ready for discharge Improvement in symptoms so as ready for discharge   Short Term Goals: Ability to identify changes in lifestyle to reduce recurrence of condition will improve Ability to verbalize feelings will improve Ability to disclose  and discuss suicidal ideas Ability to demonstrate self-control will improve Ability to identify and develop effective coping behaviors will improve Ability to  maintain clinical measurements within normal limits will improve Compliance with prescribed medications will improve Ability to identify changes in lifestyle to reduce recurrence of condition will improve Ability to verbalize feelings will improve Ability to disclose and discuss suicidal ideas Ability to demonstrate self-control will improve Ability to identify and develop effective coping behaviors will improve Ability to maintain clinical measurements within normal limits will improve Compliance with prescribed medications will improve     Medication Management: Evaluate patient's response, side effects, and tolerance of medication regimen.  Therapeutic Interventions: 1 to 1 sessions, Unit Group sessions and Medication administration.  Evaluation of Outcomes: Progressing   RN Treatment Plan for Primary Diagnosis: <principal problem not specified> Long Term Goal(s): Knowledge of disease and therapeutic regimen to maintain health will improve  Short Term Goals: Ability to remain free from injury will improve, Ability to demonstrate self-control, Ability to disclose and discuss suicidal ideas and Compliance with prescribed medications will improve  Medication Management: RN will administer medications as ordered by provider, will assess and evaluate patient's response and provide education to patient for prescribed medication. RN will report any adverse and/or side effects to prescribing provider.  Therapeutic Interventions: 1 on 1 counseling sessions, Psychoeducation, Medication administration, Evaluate responses to treatment, Monitor vital signs and CBGs as ordered, Perform/monitor CIWA, COWS, AIMS and Fall Risk screenings as ordered, Perform wound care treatments as ordered.  Evaluation of Outcomes: Progressing   LCSW Treatment Plan for Primary Diagnosis: <principal problem not specified> Long Term Goal(s): Safe transition to appropriate next level of care at discharge, Engage patient  in therapeutic group addressing interpersonal concerns.  Short Term Goals: Engage patient in aftercare planning with referrals and resources, Increase social support, Increase emotional regulation and Increase skills for wellness and recovery  Therapeutic Interventions: Assess for all discharge needs, 1 to 1 time with Social worker, Explore available resources and support systems, Assess for adequacy in community support network, Educate family and significant other(s) on suicide prevention, Complete Psychosocial Assessment, Interpersonal group therapy.  Evaluation of Outcomes: Progressing   Progress in Treatment: Attending groups: No. Participating in groups: No. Taking medication as prescribed: Yes. Toleration medication: Yes. Family/Significant other contact made: Yes, individual(s) contacted:  patient's son(s) Patient understands diagnosis: Yes. Discussing patient identified problems/goals with staff: Yes. Medical problems stabilized or resolved: Yes. Denies suicidal/homicidal ideation: Yes. Issues/concerns per patient self-inventory: Yes.  New problem(s) identified: Yes, Describe:  issues with living situation  New Short Term/Long Term Goal(s): Patient's goal "I need to be stable. Work on Pharmacologist."  Discharge Plan or Barriers: Patient is unsure at this time and states she cannot return to her mother's home. Patient wants to move to [place] but must first contact her father. Social work asked if patient could temporarily stay with one of her adult children and this may be an option.  Reason for Continuation of Hospitalization: Anxiety Depression Suicidal ideation  Estimated Length of Stay: Continuing to assess  Attendees: Patient: Stefanie Galvan 09/25/2017 2:56 PM  Physician: Marguerita Merles 09/25/2017 2:56 PM  Nursing: Armando Reichert, RN 09/25/2017 2:56 PM  RN Care Manager: 09/25/2017 2:56 PM  Social Worker: Enid Cutter, Social Work Intern 09/25/2017 2:56 PM  Recreational  Therapist:  09/25/2017 2:56 PM  Other:  09/25/2017 2:56 PM  Other:  09/25/2017 2:56 PM  Other: 09/25/2017 2:56 PM    Scribe for Treatment Team: Darreld Mclean, Student-Social  Work 09/25/2017 2:56 PM

## 2017-09-25 NOTE — Progress Notes (Addendum)
Nursing Progress Note [phone] [id number]  Data: On initial approach, patient is observed up and interactive in the dayroom with peers. Patient presents with animated affect and euphoric mood. Patient reports she felt triggered last night "that I didn't matter, and that's how my mom makes me feel". Patient reports she overall feels "so much better" and "knows what I need to do when I leave".  Patient denies SI/HI/AVH. Patient contracts for safety on the unit at this time. Patient completed self-inventory sheet and rate depression, hopelessness, and anxiety 2,0,0 respectively. Patient reports her goals today are to "find a different place to live" and "figure out what I need to do to discharge". Patient has several somatic complaints today including a 7/10 headache and "the shakes".  Action: Patient safety maintained with q15 min safety checks. Low fall risk precautions in place. Emotional support given. 1:1 interaction and active listening provided. Patient encouraged to attend meals and groups. Labs, vital signs and patient behavior monitored throughout shift. Patient encouraged to work on treatment plan and goals.  Response: Patient remains safe on the unit at this time. Patient is interacting with peers appropriately on the unit. Will continue to support and monitor.
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
## 2017-09-25 NOTE — Progress Notes (Signed)
Recreation Therapy Notes  Date: 4.10.19 Time: 9:30 a.m. Location: [address] Dayroom   Group Topic: Stress Management   Goal Area(s) Addresses:  Goal 1.1: To reduce stress  -Patient will feel a reduction in stress level  -Patient will learn the importance of stress management  -Patient will participate during stress management group      Intervention: Stress Management  Activity: Meditation- Patients were in a peaceful environment with soft lighting enhancing patients mood. Patients listened to a deep concentration meditation to help decrease stress levels   Education: Stress Management, Discharge Planning.    Education Outcome: Acknowledges edcuation/In group clarification offered/Needs additional education   Clinical Observations/Feedback:: Patient did not attend     Sudeep Scheibel, Recreation Therapy Intern   Marny Smethers 09/25/2017 8:41 AM 

## 2017-09-25 NOTE — Progress Notes (Signed)
Patient ID: Stefanie Galvan, female   DOB: 01/28/1966, 52 y.o.   MRN: [id number]  Pt currently presents with a flat affect and cooperative behavior. Pt reports to Clinical research associatewriter that their goal is to "figure out where I can stay in [place] with my dad." Reports interest in contacting her mother, reports she would like for it to be mediated. Pt states "I'm going to miss my kids but she is my trigger, I need a break." Reports ongoing coughing and congestion. Pt reports good sleep with current medication regimen.   Pt provided with medications per providers orders. Pt's labs and vitals were monitored throughout the night. Pt given a 1:1 about emotional and mental status. Pt supported and encouraged to express concerns and questions. Pt educated on medications and assertiveness techniques. Pt given an extra pillow to elevate her head.  Pt's safety ensured with 15 minute and environmental checks. Pt currently denies SI/HI and A/V hallucinations. Pt verbally agrees to seek staff if SI/HI or A/VH occurs and to consult with staff before acting on any harmful thoughts. Will continue POC.
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
## 2017-09-25 NOTE — [facility] Group Notes (Signed)
[facility]  09/25/2017  ? Type of Therapy: Mental Health Association Presentation ? Participation Level: Active ? Participation Quality: Attentive ? Affect: Appropriate ? Cognitive: Oriented ? Insight: Developing/Improving ? Engagement in Therapy: Engaged ? Modes of Intervention: Discussion, Education and Socialization ? Summary of Progress/Problems: Mental Health Association [facility](MHA) Speaker came to talk about his personal journey with mental health. The pt processed ways by which to relate to the speaker. MHA speaker provided handouts and educational information pertaining to groups and services offered by the [facility]. Pt was engaged in speaker's presentation and was receptive to resources provided.

## 2017-09-25 NOTE — Progress Notes (Signed)
Adult Psychoeducational Group Note  Date:  09/25/2017 Time:  8:44 PM  Group Topic/Focus:  Wrap-Up Group:   The focus of this group is to help patients review their daily goal of treatment and discuss progress on daily workbooks.  Participation Level:  Active  Participation Quality:  Appropriate  Affect:  Appropriate  Cognitive:  Alert and Oriented  Insight: Good  Engagement in Group:  Engaged  Modes of Intervention:  Discussion  Additional Comments:  Pt attended group this evening. Pt rated her day a 9/10.  Leo GrosserMegan A Chandan Fly 09/25/2017, 8:44 PM

## 2017-09-25 NOTE — BHH Group Notes (Signed)
[facility] Group Notes:  (Nursing/MHT/Case Management/Adjunct)  Date:  09/25/2017  Time:  1330  Type of Therapy:  Nurse Education  Participation Level:  Did Not Attend  Participation Quality:  didn't attend  Affect:  didn't attend  Cognitive:  didn't attend  Insight:  None  Engagement in Group:  None  Modes of Intervention:  Discussion, Education, Exploration and Support  Summary of Progress/Problems: pt didn't attend group  Raylene MiyamotoMichael R Elliet Goodnow 09/25/2017, 4:16 PM

## 2017-09-26 MED ORDER — LORATADINE 10 MG PO TABS
10.0000 mg | ORAL_TABLET | Freq: Every day | ORAL | Status: DC
  Administered 2017-09-26 – 2017-09-28 (×3): 10 mg via ORAL
  Filled 2017-09-26 (×4): qty 1

## 2017-09-26 MED ORDER — AMOXICILLIN-POT CLAVULANATE 500-125 MG PO TABS
1.0000 | ORAL_TABLET | Freq: Three times a day (TID) | ORAL | Status: DC
  Administered 2017-09-26 – 2017-09-28 (×5): 500 mg via ORAL
  Filled 2017-09-26 (×9): qty 1

## 2017-09-26 NOTE — Progress Notes (Signed)
Nursing Progress Note [phone]  Data: Patient presents with pleasant mood and is seen interacting with peers and attending groups. Patient complaint with scheduled medications. Patient denies SI/HI/AVH or pain. Patient contracts for safety on the unit at this time. Patient reports complaints of cold-like symptoms. Patient provided PRN medications as ordered.   Action: Patient educated about and provided medication per provider's orders. Patient safety maintained with q15 min safety checks. _ fall risk precautions in place. Emotional support given. 1:1 interaction and active listening provided. Patient encouraged to attend meals and groups. Labs, vital signs and patient behavior monitored throughout shift. Patient encouraged to work on treatment plan and goals.  Response: Patient remains safe on the unit at this time. Patient is interacting with peers appropriately on the unit. Will continue to support and monitor.

## 2017-09-26 NOTE — Progress Notes (Signed)
[facility] MD Progress Note  09/26/2017 2:24 PM Stefanie Galvan  MRN:  [id number] Subjective: Patient is seen and examined.  Patient is a 52 year old female with past psychiatric history significant for bipolar depression who seen in follow-up.  She feels a little bit better today.  She did speak with her mother on the telephone.  She laid down some boundaries with her mother.  They did okay, but the mother did call back later and was pleasant.  Patient denied any problems with the addition of the Lamictal and the removal of the lithium.  Her mood seems to be stable.  She denied any rash.  She is sleeping relatively well.  She continues to have a cough, productive, and some sinus pain.  We discussed the possibility of antibiotics in case of infection as well as Claritin for her allergies.  No racing thoughts, no pressured speech.  No euphoria.  She denied any suicidal ideation. Principal Problem: <principal problem not specified> Diagnosis:   Patient Active Problem List   Diagnosis Date Noted  . Bipolar affective (HCC) [F31.9] 09/24/2017  . Lithium overdose [T56.891A] 09/23/2017  . Adjustment disorder with mixed disturbance of emotions and conduct [F43.25]   . Lithium overdose, intentional self-harm, initial encounter (HCC) [F62.130Q] 09/22/2017  . QT prolongation [R94.31] 09/22/2017  . Bipolar 1 disorder, depressed (HCC) [F31.9] 03/19/2017  . Bipolar 2 disorder, major depressive episode (HCC) [F31.81] 03/04/2017    Class: Chronic  . Chronic post-traumatic stress disorder (PTSD) [F43.12] 03/04/2017    Class: Chronic  . DDD (degenerative disc disease), lumbar [M51.36] 03/04/2017  . Arthritis [M19.90] 08/29/2016   Total Time spent with patient: 20 minutes  Past Psychiatric History: See admission H&P  Past Medical History:  Past Medical History:  Diagnosis Date  . Anxiety   . Arthritis   . Depression 2.5 yrs ago   situational dep-no current tx/meds  . TBI (traumatic brain injury) (HCC)  11/10/2005    Past Surgical History:  Procedure Laterality Date  . BREAST REDUCTION SURGERY  02/25/2012   Procedure: MAMMARY REDUCTION  (BREAST);  Surgeon: Louisa Second, MD;  Location: [facility];  Service: Plastics;  Laterality: Bilateral;  bilateral reduction  . DILATION AND CURETTAGE OF UTERUS  2001  . TEMPOROMANDIBULAR JOINT SURGERY  1984; 1996   X 2; bilateral  . TUBAL LIGATION  2003   Family History:  Family History  Problem Relation Age of Onset  . Depression Father   . Anxiety disorder Brother   . Depression Maternal Grandmother    Family Psychiatric  History: See admission H&P Social History:  Social History   Substance and Sexual Activity  Alcohol Use Yes   Comment: Occasional drink      Social History   Substance and Sexual Activity  Drug Use No    Social History   Socioeconomic History  . Marital status: Divorced    Spouse name: Not on file  . Number of children: Not on file  . Years of education: Not on file  . Highest education level: Not on file  Occupational History  . Not on file  Social Needs  . Financial resource strain: Not on file  . Food insecurity:    Worry: Not on file    Inability: Not on file  . Transportation needs:    Medical: Not on file    Non-medical: Not on file  Tobacco Use  . Smoking status: Never Smoker  . Smokeless tobacco: Never Used  Substance and Sexual Activity  .  Alcohol use: Yes    Comment: Occasional drink   . Drug use: No  . Sexual activity: Not Currently  Lifestyle  . Physical activity:    Days per week: Not on file    Minutes per session: Not on file  . Stress: Not on file  Relationships  . Social connections:    Talks on phone: Not on file    Gets together: Not on file    Attends religious service: Not on file    Active member of club or organization: Not on file    Attends meetings of clubs or organizations: Not on file    Relationship status: Not on file  Other Topics Concern  .  Not on file  Social History Narrative  . Not on file   Additional Social History:                         Sleep: Fair  Appetite:  Good  Current Medications: Current Facility-Administered Medications  Medication Dose Route Frequency Provider Last Rate Last Dose  . acetaminophen (TYLENOL) tablet 650 mg  650 mg Oral Q6H PRN Antonieta Pertlary, Arminta Gamm Lawson, MD   650 mg at 09/25/17 1409  . alum & mag hydroxide-simeth (MAALOX/MYLANTA) 200-200-20 MG/5ML suspension 30 mL  30 mL Oral Q4H PRN Rankin, Shuvon B, NP      . benzonatate (TESSALON) capsule 100 mg  100 mg Oral Q8H PRN Rankin, Shuvon B, NP   100 mg at 09/26/17 0750  . escitalopram (LEXAPRO) tablet 10 mg  10 mg Oral QHS Rankin, Shuvon B, NP   10 mg at 09/25/17 2225  . lamoTRIgine (LAMICTAL) tablet 25 mg  25 mg Oral BID Antonieta Pertlary, Raisa Ditto Lawson, MD   25 mg at 09/26/17 0749  . magnesium hydroxide (MILK OF MAGNESIA) suspension 30 mL  30 mL Oral Daily PRN Rankin, Shuvon B, NP      . traZODone (DESYREL) tablet 50 mg  50 mg Oral QHS PRN Rankin, Shuvon B, NP        Lab Results: No results found for this or any previous visit (from the past 48 hour(s)).  Blood Alcohol level:  Lab Results  Component Value Date   ETH <10 09/22/2017   ETH  09/12/2008    <5        LOWEST DETECTABLE LIMIT FOR SERUM ALCOHOL IS 5 mg/dL FOR MEDICAL PURPOSES ONLY    Metabolic Disorder Labs: Lab Results  Component Value Date   HGBA1C 4.9 03/19/2017   MPG 93.93 03/19/2017   No results found for: PROLACTIN Lab Results  Component Value Date   CHOL 259 (H) 03/20/2017   TRIG 145 03/20/2017   HDL 51 03/20/2017   CHOLHDL 5.1 03/20/2017   VLDL 29 03/20/2017   LDLCALC 179 (H) 03/20/2017    Physical Findings: AIMS: Facial and Oral Movements Muscles of Facial Expression: None, normal Lips and Perioral Area: None, normal Jaw: None, normal Tongue: None, normal,Extremity Movements Upper (arms, wrists, hands, fingers): None, normal Lower (legs, knees, ankles, toes):  None, normal, Trunk Movements Neck, shoulders, hips: None, normal, Overall Severity Severity of abnormal movements (highest score from questions above): None, normal Incapacitation due to abnormal movements: None, normal Patient's awareness of abnormal movements (rate only patient's report): No Awareness, Dental Status Current problems with teeth and/or dentures?: No Does patient usually wear dentures?: No  CIWA:    COWS:     Musculoskeletal: Strength & Muscle Tone: within normal limits Gait & Station: normal  Patient leans: N/A  Psychiatric Specialty Exam: Physical Exam  Nursing note and vitals reviewed. Constitutional: She is oriented to person, place, and time. She appears well-developed and well-nourished.  HENT:  Head: Normocephalic and atraumatic.  Respiratory: Effort normal.  Musculoskeletal: Normal range of motion.  Neurological: She is alert and oriented to person, place, and time.    ROS  Blood pressure 116/74, pulse 68, temperature 98.4 F (36.9 C), temperature source Oral, resp. rate 16, height 5\' 7"  (1.702 m), weight 94.3 kg (208 lb), SpO2 98 %.Body mass index is 32.58 kg/m.  General Appearance: Casual  Eye Contact:  Good  Speech:  Normal Rate  Volume:  Normal  Mood:  Depressed  Affect:  Congruent  Thought Process:  Coherent  Orientation:  Full (Time, Place, and Person)  Thought Content:  Logical  Suicidal Thoughts:  No  Homicidal Thoughts:  No  Memory:  Immediate;   Fair  Judgement:  Fair  Insight:  Fair  Psychomotor Activity:  Increased  Concentration:  Concentration: Fair  Recall:  Fair  Fund of Knowledge:  Good  Language:  Good  Akathisia:  No  Handed:  Right  AIMS (if indicated):     Assets:  Communication Skills Desire for Improvement Physical Health Resilience Talents/Skills  ADL's:  Intact  Cognition:  WNL  Sleep:  Number of Hours: 6.75     Treatment Plan Summary: Daily contact with patient to assess and evaluate symptoms and progress  in treatment, Medication management and Plan Patient is seen and examined.  Patient is a 52 year old female with the above-stated past psychiatric history seen in follow-up.  She seems to be slowly improving.  We will continue her Lexapro as well as Lamictal and trazodone.  For her upper respiratory tract symptoms I consider this probable bronchitis, and I will start her on Augmentin 500 mg p.o. 3 times daily, and is well Claritin 10 mg p.o. daily.  We will continue the [employer].  Most likely we may need to set up a family meeting between the mother and the patient prior to discharge.  Her plan to go to [place] is not going to be fulfilled, and most likely she will have to return to her mother's home.  Antonieta Pert, MD 09/26/2017, 2:24 PM

## 2017-09-26 NOTE — Progress Notes (Signed)
  DATA ACTION RESPONSE  Objective- Pt. is visible in the dayroom, seen interacting with peers.  Presents with an animated/anxious affect and mood. Pt c/o of ongoing "sinus" sore on right side of face. Pt has been requesting PRNs to help with symptoms.  Subjective- Denies having any SI/HI/AVH at this time. Rates pain 8/10;face. Is cooperative and remains safe on the unit.  1:1 interaction in private to establish rapport. Encouragement, education, & support given from staff.  PRN tylenol and tessalon requested and will re-eval accordingly.   Safety maintained with Q 15 checks. Continue with POC.

## 2017-09-26 NOTE — Progress Notes (Signed)
Adult Psychoeducational Group Note  Date:  09/26/2017 Time:  5:13 PM  Group Topic/Focus:  Goals Group:   The focus of this group is to help patients establish daily goals to achieve during treatment and discuss how the patient can incorporate goal setting into their daily lives to aide in recovery.  Participation Level:  Active  Participation Quality:  Appropriate  Affect:  Appropriate  Cognitive:  Alert  Insight: Appropriate  Engagement in Group:  Engaged  Modes of Intervention:  Activity  Additional Comments:  Pt attended group and participated in group activity.  Jamaiya Tunnell R Kermit Arnette 09/26/2017, 5:13 PM

## 2017-09-26 NOTE — [facility] Suicide Risk Assessment (Addendum)
[facility] INPATIENT:  Family/Significant Other Suicide Prevention Education  Suicide Prevention Education:  Education Completed; patient's son, Alethia Bertholdthan Creech [phone]) has been identified by the patient as the family member/significant other with whom the patient will be residing, and identified as the person(s) who will aid the patient in the event of a mental health crisis (suicidal ideations/suicide attempt).  With written consent from the patient, the family member/significant other has been provided the following suicide prevention education, prior to the and/or following the discharge of the patient.  The suicide prevention education provided includes the following:  Suicide risk factors  Suicide prevention and interventions  National Suicide Hotline telephone number  [facility] assessment telephone number  [facility] 911  [facility] and/or [facility]idential [facility] telephone number  Request made of family/significant other to:  Remove weapons (e.g., guns, rifles, knives), all items previously/currently identified as safety concern.    Remove drugs/medications (over-the-counter, prescriptions, illicit drugs), all items previously/currently identified as a safety concern.  The family member/significant other verbalizes understanding of the suicide prevention education information provided.  The family member/significant other agrees to remove the items of safety concern listed above.  Patient's son, Enid Derrythan, states there are no guns or weapons in the patient's mother's home.   Enid Derrythan does not know where the patient will live upon discharge but he may assist her in finding a local place of her own. He attributes her primary stressor as the patient's relationship with her mother.  Enid Derrythan states the patient appeared to be doing "much better" since her last behavioral health hospitalization. He believes this was an impulsive act following a  trivial argument between the patient and her mother.  Enid Derrythan shares the patient has a history of acting impulsively in retaliation "an I'll show you kind of thing," when he referred the patient's suicide attempt via overdose. Enid Derrythan states the patient has prior suicide attempts that are similar. Enid Derrythan shares that the patient has a history of mis-mananging money and will spend everything she has on impulsive spending sprees that leave her unable to pay her bills or rent.  Enid Derrythan asked if the patient had been keeping up with her outpatient appointments and consistently taking her medication prior to her hospitalization. He states she functions much better when she is consistent with outpatient treatment.  Darreld McleanCharlotte C Avigayil Ton 09/26/2017, 10:33 AM

## 2017-09-26 NOTE — [facility] Group Notes (Signed)
Adult Psychoeducational Group Note  Date:  09/26/2017 Time:  11:50 PM  Group Topic/Focus:  Wrap-Up Group:   The focus of this group is to help patients review their daily goal of treatment and discuss progress on daily workbooks.  Participation Level:  Active  Participation Quality:  Appropriate and Attentive  Affect:  Appropriate  Cognitive:  Alert and Appropriate  Insight: Appropriate and Good  Engagement in Group:  Engaged  Modes of Intervention:  Discussion and Education  Additional Comments:  Pt attended and participated in wrap up group this evening. Pt had a really good day even thought hey are feeling really sick. Pt goal was to continue to seek housing when they leave, and they completed their goal. Pt son came to visit, which was a positive for the pt.   Stefanie NettersOctavia A Joylynn Defrancesco 09/26/2017, 11:50 PM

## 2017-09-26 NOTE — [facility] Group Notes (Signed)
LCSW Group Therapy Note  09/26/2017 1:15pm  Type of Therapy/Topic:  Group Therapy:  Balance in Life  Participation Level:  Active  Description of Group:    This group will address the concept of balance and how it feels and looks when one is unbalanced. Patients will be encouraged to process areas in their lives that are out of balance and identify reasons for remaining unbalanced. Facilitators will guide patients in utilizing problem-solving interventions to address and correct the stressor making their life unbalanced. Understanding and applying boundaries will be explored and addressed for obtaining and maintaining a balanced life. Patients will be encouraged to explore ways to assertively make their unbalanced needs known to significant others in their lives, using other group members and facilitator for support and feedback.  Therapeutic Goals: 1. Patient will identify two or more emotions or situations they have that consume much of in their lives. 2. Patient will identify signs/triggers that life has become out of balance:  3. Patient will identify two ways to set boundaries in order to achieve balance in their lives:  4. Patient will demonstrate ability to communicate their needs through discussion and/or role plays  Summary of Patient Progress: Patient appropriately participated in group. Patient identified family relationships, financial responsibilities, and intellectual or creative explorations as areas of her life that feel most imbalanced at this time.  Therapeutic Modalities:   Cognitive Behavioral Therapy Solution-Focused Therapy Assertiveness Training  Darreld McleanCharlotte C Rhonda Linan, Student-Social Work 09/26/2017 1:05 PM

## 2017-09-27 NOTE — Progress Notes (Signed)
D) Pt has been attending the program and participating in all the groups. States that she is feeling she is getting a lot out of the program. Was on the phone speaking with relatives this afternoon and the conversation was pleasant  Pt denies SI and HI. Interacts with her peers appropriately. States she is feeling better overall especially physically. A) Given support, reassurance and praise. Encouragement given. Provided with a 1:1 R) Denies SI and HI.

## 2017-09-27 NOTE — [facility] Group Notes (Signed)
[facility] LCSW Group Therapy Note  Date/Time: 09/27/17, 1315  Type of Therapy and Topic:  Group Therapy:  Feelings around Relapse and Recovery  Participation Level:  Active   Mood:pleasant  Description of Group:    Patients in this group will discuss emotions they experience before and after a relapse. They will process how experiencing these feelings, or avoidance of experiencing them, relates to having a relapse. Facilitator will guide patients to explore emotions they have related to recovery. Patients will be encouraged to process which emotions are more powerful. They will be guided to discuss the emotional reaction significant others in their lives may have to patients' relapse or recovery. Patients will be assisted in exploring ways to respond to the emotions of others without this contributing to a relapse.  Therapeutic Goals: 1. Patient will identify two or more emotions that lead to relapse for them:  2. Patient will identify two emotions that result when they relapse:  3. Patient will identify two emotions related to recovery:  4. Patient will demonstrate ability to communicate their needs through discussion and/or role plays.   Summary of Patient Progress:Pt was active in group, then left for part of the time, and returned for the end.  Pt made several good comments during the group discussion.     Therapeutic Modalities:   Cognitive Behavioral Therapy Solution-Focused Therapy Assertiveness Training Relapse Prevention Therapy  Daleen SquibbGreg Laterrian Hevener, LCSW

## 2017-09-27 NOTE — Progress Notes (Signed)
[facility]  09/27/2017 11:17 AM Stefanie Galvan  MRN:  [id number] Subjective: The patient is seen and examined.  Patient is a 52 year old female with a past psychiatric history significant for bipolar disorder as well as recent intentional lithium overdose.  She is seen in follow-up.  She continues to slowly improve.  She talked to her mother a couple of times yesterday.  She still hoping to stay with a friend, but it sounds like she and her mother are comfortable with her returning home.  We discussed coping skills in dealing with her mother.  She denied any rashes with regard to the Lamictal, and seems to be tolerating it well.  She denied any side effects to any other medicines.  No racing thoughts, no pressured speech.  Her sleep is adequate.  No suicidal ideation. Principal Problem: <principal problem not specified> Diagnosis:   Patient Active Problem List   Diagnosis Date Noted  . Bipolar affective (HCC) [F31.9] 09/24/2017  . Lithium overdose [T56.891A] 09/23/2017  . Adjustment disorder with mixed disturbance of emotions and conduct [F43.25]   . Lithium overdose, intentional self-harm, initial encounter (HCC) [W09.811B][T56.892A] 09/22/2017  . QT prolongation [R94.31] 09/22/2017  . Bipolar 1 disorder, depressed (HCC) [F31.9] 03/19/2017  . Bipolar 2 disorder, major depressive episode (HCC) [F31.81] 03/04/2017    Class: Chronic  . Chronic post-traumatic stress disorder (PTSD) [F43.12] 03/04/2017    Class: Chronic  . DDD (degenerative disc disease), lumbar [M51.36] 03/04/2017  . Arthritis [M19.90] 08/29/2016   Total Time spent with patient: 20 minutes  Past Psychiatric History: See admission H&P  Past Medical History:  Past Medical History:  Diagnosis Date  . Anxiety   . Arthritis   . Depression 2.5 yrs ago   situational dep-no current tx/meds  . TBI (traumatic brain injury) (HCC) 11/10/2005    Past Surgical History:  Procedure Laterality Date  . BREAST REDUCTION SURGERY   02/25/2012   Procedure: MAMMARY REDUCTION  (BREAST);  Surgeon: Louisa SecondGerald Truesdale, MD;  Location: [facility];  Service: Plastics;  Laterality: Bilateral;  bilateral reduction  . DILATION AND CURETTAGE OF UTERUS  2001  . TEMPOROMANDIBULAR JOINT SURGERY  1984; 1996   X 2; bilateral  . TUBAL LIGATION  2003   Family History:  Family History  Problem Relation Age of Onset  . Depression Father   . Anxiety disorder Brother   . Depression Maternal Grandmother    Family Psychiatric  History: See admission H&P Social History:  Social History   Substance and Sexual Activity  Alcohol Use Yes   Comment: Occasional drink      Social History   Substance and Sexual Activity  Drug Use No    Social History   Socioeconomic History  . Marital status: Divorced    Spouse name: Not on file  . Number of children: Not on file  . Years of education: Not on file  . Highest education level: Not on file  Occupational History  . Not on file  Social Needs  . Financial resource strain: Not on file  . Food insecurity:    Worry: Not on file    Inability: Not on file  . Transportation needs:    Medical: Not on file    Non-medical: Not on file  Tobacco Use  . Smoking status: Never Smoker  . Smokeless tobacco: Never Used  Substance and Sexual Activity  . Alcohol use: Yes    Comment: Occasional drink   . Drug use: No  .  Sexual activity: Not Currently  Lifestyle  . Physical activity:    Days per week: Not on file    Minutes per session: Not on file  . Stress: Not on file  Relationships  . Social connections:    Talks on phone: Not on file    Gets together: Not on file    Attends religious service: Not on file    Active member of club or organization: Not on file    Attends meetings of clubs or organizations: Not on file    Relationship status: Not on file  Other Topics Concern  . Not on file  Social History Narrative  . Not on file   Additional Social History:                          Sleep: Good  Appetite:  Fair  Current Medications: Current Facility-Administered Medications  Medication Dose Route Frequency Provider Last Rate Last Dose  . acetaminophen (TYLENOL) tablet 650 mg  650 mg Oral Q6H PRN Antonieta Pert, MD   650 mg at 09/27/17 0830  . alum & mag hydroxide-simeth (MAALOX/MYLANTA) 200-200-20 MG/5ML suspension 30 mL  30 mL Oral Q4H PRN Rankin, Shuvon B, NP      . amoxicillin-clavulanate (AUGMENTIN) 500-125 MG per tablet 500 mg  1 tablet Oral TID Antonieta Pert, MD   500 mg at 09/27/17 0827  . benzonatate (TESSALON) capsule 100 mg  100 mg Oral Q8H PRN Rankin, Shuvon B, NP   100 mg at 09/26/17 2116  . escitalopram (LEXAPRO) tablet 10 mg  10 mg Oral QHS Rankin, Shuvon B, NP   10 mg at 09/26/17 2116  . lamoTRIgine (LAMICTAL) tablet 25 mg  25 mg Oral BID Antonieta Pert, MD   25 mg at 09/27/17 0827  . loratadine (CLARITIN) tablet 10 mg  10 mg Oral Daily Antonieta Pert, MD   10 mg at 09/27/17 0827  . magnesium hydroxide (MILK OF MAGNESIA) suspension 30 mL  30 mL Oral Daily PRN Rankin, Shuvon B, NP      . traZODone (DESYREL) tablet 50 mg  50 mg Oral QHS PRN Rankin, Shuvon B, NP        Lab Results: No results found for this or any previous visit (from the past 48 hour(s)).  Blood Alcohol level:  Lab Results  Component Value Date   ETH <10 09/22/2017   ETH  09/12/2008    <5        LOWEST DETECTABLE LIMIT FOR SERUM ALCOHOL IS 5 mg/dL FOR MEDICAL PURPOSES ONLY    Metabolic Disorder Labs: Lab Results  Component Value Date   HGBA1C 4.9 03/19/2017   MPG 93.93 03/19/2017   No results found for: PROLACTIN Lab Results  Component Value Date   CHOL 259 (H) 03/20/2017   TRIG 145 03/20/2017   HDL 51 03/20/2017   CHOLHDL 5.1 03/20/2017   VLDL 29 03/20/2017   LDLCALC 179 (H) 03/20/2017    Physical Findings: AIMS: Facial and Oral Movements Muscles of Facial Expression: None, normal Lips and Perioral Area: None, normal Jaw:  None, normal Tongue: None, normal,Extremity Movements Upper (arms, wrists, hands, fingers): None, normal Lower (legs, knees, ankles, toes): None, normal, Trunk Movements Neck, shoulders, hips: None, normal, Overall Severity Severity of abnormal movements (highest score from questions above): None, normal Incapacitation due to abnormal movements: None, normal Patient's awareness of abnormal movements (rate only patient's report): No Awareness, Dental Status Current problems with  teeth and/or dentures?: No Does patient usually wear dentures?: No  CIWA:    COWS:     Musculoskeletal: Strength & Muscle Tone: within normal limits Gait & Station: normal Patient leans: N/A  Psychiatric Specialty Exam: Physical Exam  Constitutional: She is oriented to person, place, and time. She appears well-developed and well-nourished.  HENT:  Head: Normocephalic and atraumatic.  Respiratory: Effort normal.  Musculoskeletal: Normal range of motion.  Neurological: She is alert and oriented to person, place, and time.    ROS  Blood pressure 125/86, pulse 75, temperature 98.6 F (37 C), temperature source Oral, resp. rate 16, height 5\' 7"  (1.702 m), weight 94.3 kg (208 lb), SpO2 98 %.Body mass index is 32.58 kg/m.  General Appearance: Casual  Eye Contact:  Fair  Speech:  Normal Rate  Volume:  Normal  Mood:  Euthymic  Affect:  Congruent  Thought Process:  Coherent  Orientation:  Full (Time, Place, and Person)  Thought Content:  Logical  Suicidal Thoughts:  No  Homicidal Thoughts:  No  Memory:  Immediate;   Fair  Judgement:  Intact  Insight:  Fair  Psychomotor Activity:  Normal  Concentration:  Concentration: Fair  Recall:  Fair  Fund of Knowledge:  Good  Language:  Good  Akathisia:  No  Handed:  Right  AIMS (if indicated):     Assets:  Communication Skills Desire for Improvement Physical Health Resilience Vocational/Educational  ADL's:  Intact  Cognition:  WNL  Sleep:  Number of  Hours: 6.25     Treatment Plan Summary: Daily contact with patient to assess and evaluate symptoms and progress in treatment, Medication management and Plan Patient seen and examined.  Patient is a 52 year old female with the above-stated past psychiatric history seen in follow-up.  She seems to continue to slowly improve.  Her mood is somewhat balance, and she is tolerating the Lamictal.  No evidence of mania at this point.  No racing thoughts, pressured speech.  She is talking about being discharged tomorrow, and I think as long as she remains nonsuicidal and non-manic as well as nondepressed we will be able to discharge her.  We will discuss with the patient as well with social work today to go on and get an appointment scheduled with her outpatient psychiatrist the first of next week.  No changes in her current medications.  Antonieta Pert, MD 09/27/2017, 11:17 AM

## 2017-09-27 NOTE — Progress Notes (Signed)
Recreation Therapy Notes  Date: 4.12.19 Time: 9:30 a.m. Location: [address] Dayroom   Group Topic: Stress Management   Goal Area(s) Addresses:  Goal 1.1: To reduce stress  -Patient will feel a reduction in stress level  -Patient will understand the importance of stress management  -Patient will participate during stress management group   Behavioral Response: Engaged   Intervention: Stress Management   Activity: Meditation- Patients were in a peaceful environment with soft lighting enhancing patients mood. Patients listened to a body scan meditation on the calm app to help decrease tension and stress levels   Education: Stress Management, Discharge Planning.    Education Outcome: Acknowledges edcuation/In group clarification offered/Needs additional education   Clinical Observations/Feedback:: Patient attended and participated appropriately during stress management group treatment.    Stefanie Galvan, Recreation Therapy Intern   Stefanie HailDarian Erland Vivas 09/27/2017 8:47 AM

## 2017-09-27 NOTE — Progress Notes (Signed)
Pt attend wrap up group. Her day was a 10. Her goal was to get clearfication her living conditions once she leaves. [place]

## 2017-09-27 NOTE — Progress Notes (Signed)
DATA ACTION RESPONSE  Objective- Pt. is visible in the dayroom, seen interacting with peers.  Presents with an anxious/tearful affect and mood. Pt states she was upset with a staff member but would not provide details. Pt states she hopes to be discharge soon.  Subjective- Denies having any SI/HI/AVH/Pain at this time. Is cooperative and remains safe on the unit.  1:1 interaction in private to establish rapport. Encouragement, education, & support given from staff.  PRN tessalon requested and will re-eval accordingly.   Safety maintained with Q 15 checks. Continue with POC.

## 2017-09-28 MED ORDER — AMOXICILLIN-POT CLAVULANATE 500-125 MG PO TABS: 1.0000 | ORAL_TABLET | Freq: Three times a day (TID) | ORAL | 0 refills | Status: DC

## 2017-09-28 MED ORDER — ESCITALOPRAM OXALATE 10 MG PO TABS: 10.0000 mg | ORAL_TABLET | Freq: Every day | ORAL | 0 refills | Status: DC

## 2017-09-28 MED ORDER — LORATADINE 10 MG PO TABS: 10.0000 mg | ORAL_TABLET | Freq: Every day | ORAL | Status: DC

## 2017-09-28 MED ORDER — LAMOTRIGINE 25 MG PO TABS: 25.0000 mg | ORAL_TABLET | Freq: Two times a day (BID) | ORAL | 0 refills | Status: DC

## 2017-09-28 MED ORDER — HYDROXYZINE HCL 25 MG PO TABS: ORAL_TABLET | ORAL | 1 refills | Status: DC

## 2017-09-28 MED ORDER — TRAZODONE HCL 50 MG PO TABS: 50.0000 mg | ORAL_TABLET | Freq: Every evening | ORAL | 0 refills | Status: DC | PRN

## 2017-09-28 NOTE — [facility] Suicide Risk Assessment (Signed)
[facility]   Principal Problem: <principal problem not specified> Discharge Diagnoses:  Patient Active Problem List   Diagnosis Date Noted  . Bipolar affective (HCC) [F31.9] 09/24/2017  . Lithium overdose [T56.891A] 09/23/2017  . Adjustment disorder with mixed disturbance of emotions and conduct [F43.25]   . Lithium overdose, intentional self-harm, initial encounter (HCC) [Z61.096E][T56.892A] 09/22/2017  . QT prolongation [R94.31] 09/22/2017  . Bipolar 1 disorder, depressed (HCC) [F31.9] 03/19/2017  . Bipolar 2 disorder, major depressive episode (HCC) [F31.81] 03/04/2017    Class: Chronic  . Chronic post-traumatic stress disorder (PTSD) [F43.12] 03/04/2017    Class: Chronic  . DDD (degenerative disc disease), lumbar [M51.36] 03/04/2017  . Arthritis [M19.90] 08/29/2016    Total Time spent with patient: 30 minutes  Musculoskeletal: Strength & Muscle Tone: within normal limits Gait & Station: normal Patient leans: N/A  Psychiatric Specialty Exam: Review of Systems  All other systems reviewed and are negative.   Blood pressure 116/79, pulse 64, temperature 98.1 F (36.7 C), temperature source Oral, resp. rate 16, height 5\' 7"  (1.702 m), weight 94.3 kg (208 lb), SpO2 98 %.Body mass index is 32.58 kg/m.  General Appearance: Fairly Groomed  Patent attorneyye Contact::  Good  Speech:  Clear and Coherent409  Volume:  Normal  Mood:  Euthymic  Affect:  Congruent  Thought Process:  Coherent  Orientation:  Full (Time, Place, and Person)  Thought Content:  Logical  Suicidal Thoughts:  No  Homicidal Thoughts:  No  Memory:  Immediate;   Good  Judgement:  Intact  Insight:  Fair  Psychomotor Activity:  Normal  Concentration:  Good  Recall:  Good  Fund of Knowledge:Good  Language: Good  Akathisia:  No  Handed:  Right  AIMS (if indicated):     Assets:  Communication Skills Desire for Improvement Housing Physical Health Social Support Talents/Skills Vocational/Educational   Sleep:  Number of Hours: 6.5  Cognition: WNL  ADL's:  Intact   Mental Status Per Nursing Assessment::   On Admission:  NA  Demographic Factors:  Divorced or widowed and Caucasian  Loss Factors: NA  Historical Factors: Impulsivity  Risk Reduction Factors:   Sense of responsibility to family, Employed, Living with another person, especially a relative, Positive therapeutic relationship and Positive coping skills or problem solving skills  Continued Clinical Symptoms:  Previous Psychiatric Diagnoses and Treatments  Cognitive Features That Contribute To Risk:  None    Suicide Risk:  Minimal: No identifiable suicidal ideation.  Patients presenting with no risk factors but with morbid ruminations; may be classified as minimal risk based on the severity of the depressive symptoms  Follow-up Information    [facility]. Go on 09/30/2017.   Specialty:  Behavioral Health Why:  Please attend your therapy appt with Boneta LucksJennifer Brown on Monday, 09/30/17, at 1:00pm. Please attend your medication appt with Dr [name] on Tuesday, 10/01/17, at 12:30pm. Contact information: [address] [place] [id number] [phone]          Plan Of Care/Follow-up recommendations:  Activity:  ad lib  Antonieta PertGreg Lawson Clary, MD 09/28/2017, 9:10 AM

## 2017-09-28 NOTE — Discharge Summary (Signed)
Physician Discharge Summary Note  Patient:  Stefanie CritchleyKimberly D Shevchenko is an 52 y.o., female MRN:  [id number] DOB:  04/30/1966 Patient phone:  [phone] (home)  Patient address:   [address] [place] [id number],  Total Time spent with patient: 30 minutes  Date of Admission:  09/24/2017 Date of Discharge: 09/28/2017  Reason for Admission:  Patient is seen and examined.  Patient was seen in treatment team with nursing and social work today.  Patient is a 52 year old female with a past psychiatric history significant for bipolar disorder type I as well as posttraumatic stress disorder.  She was transferred to our facility after presenting [facility] the [facility] emergency department on 4/7 after an intentional overdose of lithium.  Patient stated that she got into an argument with her mother, and impulsively took an overdose of lithium.  She presented to the emergency room and her level was approximately 0.89.  She was admitted to the hospital for evaluation.  Her lithium level climbed to 2.17.  She also had QTC prolongation.  Her level dropped, and she never had any neurological or physical manifestations of long-term lithium damage.  He was evaluated by psychiatry, decision was made to transfer to our facility for evaluation and stabilization.  She currently denies any suicidal ideation.  She stated the event was impulsive.  She had seen her psychiatrist and therapist shortly before the overdose, and she felt as though she was doing well.  She stated her mother has problems, and she does not cope well with them.  She lives in her mother's basement since a bad relationship caused her significant financial distress.  She denies current suicidality, and we discussed today not going back on the lithium.  We discussed the potential of starting Lamictal.  She did report having poor sleep and multiple nighttime awakenings when she was in the medical hospital.  She denied any mania or suicidal symptoms prior to the  event.  She was admitted to the hospital for evaluation and stabilization.    Principal Problem: Bipolar affective [facility](HCC) Discharge Diagnoses: Patient Active Problem List   Diagnosis Date Noted  . Bipolar affective (HCC) [F31.9] 09/24/2017  . Lithium overdose [T56.891A] 09/23/2017  . Adjustment disorder with mixed disturbance of emotions and conduct [F43.25]   . Lithium overdose, intentional self-harm, initial encounter (HCC) [G64.403K][T56.892A] 09/22/2017  . QT prolongation [R94.31] 09/22/2017  . Bipolar 1 disorder, depressed (HCC) [F31.9] 03/19/2017  . Bipolar 2 disorder, major depressive episode (HCC) [F31.81] 03/04/2017    Class: Chronic  . Chronic post-traumatic stress disorder (PTSD) [F43.12] 03/04/2017    Class: Chronic  . DDD (degenerative disc disease), lumbar [M51.36] 03/04/2017  . Arthritis [M19.90] 08/29/2016    Past Psychiatric History:   Past Medical History:  Past Medical History:  Diagnosis Date  . Anxiety   . Arthritis   . Depression 2.5 yrs ago   situational dep-no current tx/meds  . TBI (traumatic brain injury) (HCC) 11/10/2005    Past Surgical History:  Procedure Laterality Date  . BREAST REDUCTION SURGERY  02/25/2012   Procedure: MAMMARY REDUCTION  (BREAST);  Surgeon: Louisa SecondGerald Truesdale, MD;  Location: [facility];  Service: Plastics;  Laterality: Bilateral;  bilateral reduction  . DILATION AND CURETTAGE OF UTERUS  2001  . TEMPOROMANDIBULAR JOINT SURGERY  1984; 1996   X 2; bilateral  . TUBAL LIGATION  2003   Family History:  Family History  Problem Relation Age of Onset  . Depression Father   . Anxiety disorder Brother   .  Depression Maternal Grandmother    Family Psychiatric  History:  Social History:  Social History   Substance and Sexual Activity  Alcohol Use Yes   Comment: Occasional drink      Social History   Substance and Sexual Activity  Drug Use No    Social History   Socioeconomic History  . Marital status: Divorced     Spouse name: Not on file  . Number of children: Not on file  . Years of education: Not on file  . Highest education level: Not on file  Occupational History  . Not on file  Social Needs  . Financial resource strain: Not on file  . Food insecurity:    Worry: Not on file    Inability: Not on file  . Transportation needs:    Medical: Not on file    Non-medical: Not on file  Tobacco Use  . Smoking status: Never Smoker  . Smokeless tobacco: Never Used  Substance and Sexual Activity  . Alcohol use: Yes    Comment: Occasional drink   . Drug use: No  . Sexual activity: Not Currently  Lifestyle  . Physical activity:    Days per week: Not on file    Minutes per session: Not on file  . Stress: Not on file  Relationships  . Social connections:    Talks on phone: Not on file    Gets together: Not on file    Attends religious service: Not on file    Active member of club or organization: Not on file    Attends meetings of clubs or organizations: Not on file    Relationship status: Not on file  Other Topics Concern  . Not on file  Social History Narrative  . Not on file    Hospital Course:  TAWAN CORKERN was admitted for Bipolar affective [facility])  and crisis management.  Pt was treated discharged with the medications listed below under Medication List.  Medical problems were identified and treated as needed.  Home medications were restarted as appropriate.  Improvement was monitored by observation and Stefanie Galvan 's daily report of symptom reduction.  Emotional and mental status was monitored by daily self-inventory reports completed by Stefanie Galvan and clinical staff.         Stefanie Galvan was evaluated by the treatment team for stability and plans for continued recovery upon discharge. Stefanie Galvan 's motivation was an integral factor for scheduling further treatment. Employment, transportation, bed availability, health status, family support, and  any pending legal issues were also considered during hospital stay. Pt was offered further treatment options upon discharge including but not limited to Residential, Intensive Outpatient, and Outpatient treatment.  Stefanie Galvan will follow up with the services as listed below under Follow Up Information.     Upon completion of this admission the patient was both mentally and medically stable for discharge denying suicidal or homicidal ideation, auditory or visualhallucinations     KEALY LEWTER responded well to treatment with Lexapro 10mg , Lamictal 50 mg and Trazodone 100 mg  without adverse effects. I Pt demonstrated improvement without reported or observed adverse effects to the point of stability appropriate for outpatient management. Pertinent labs include:  TSh 5.925 follow-up with PCP for additional labs ie T4/T3, Lithium level at a downward trend after intentional overdose, 0.60 at discharge for which outpatient follow-up is necessary for lab recheck as mentioned below. Reviewed CBC, CMP, BAL, and UDS; all unremarkable  aside from noted exceptions.   Physical Findings: AIMS: Facial and Oral Movements Muscles of Facial Expression: None, normal Lips and Perioral Area: None, normal Jaw: None, normal Tongue: None, normal,Extremity Movements Upper (arms, wrists, hands, fingers): None, normal Lower (legs, knees, ankles, toes): None, normal, Trunk Movements Neck, shoulders, hips: None, normal, Overall Severity Severity of abnormal movements (highest score from questions above): None, normal Incapacitation due to abnormal movements: None, normal Patient's awareness of abnormal movements (rate only patient's report): No Awareness, Dental Status Current problems with teeth and/or dentures?: No Does patient usually wear dentures?: No  CIWA:    COWS:     Musculoskeletal: Strength & Muscle Tone: within normal limits Gait & Station: normal Patient leans: N/A  Psychiatric  Specialty Exam: See SRa by MD  Physical Exam  ROS  Blood pressure 116/79, pulse 64, temperature 98.1 F (36.7 C), temperature source Oral, resp. rate 16, height 5\' 7"  (1.702 m), weight 94.3 kg (208 lb), SpO2 98 %.Body mass index is 32.58 kg/m.   Have you used any form of tobacco in the last 30 days? (Cigarettes, Smokeless Tobacco, Cigars, and/or Pipes): No  Has this patient used any form of tobacco in the last 30 days? (Cigarettes, Smokeless Tobacco, Cigars, and/or Pipes), No  Blood Alcohol level:  Lab Results  Component Value Date   ETH <10 09/22/2017   ETH  09/12/2008    <5        LOWEST DETECTABLE LIMIT FOR SERUM ALCOHOL IS 5 mg/dL FOR MEDICAL PURPOSES ONLY    Metabolic Disorder Labs:  Lab Results  Component Value Date   HGBA1C 4.9 03/19/2017   MPG 93.93 03/19/2017   No results found for: PROLACTIN Lab Results  Component Value Date   CHOL 259 (H) 03/20/2017   TRIG 145 03/20/2017   HDL 51 03/20/2017   CHOLHDL 5.1 03/20/2017   VLDL 29 03/20/2017   LDLCALC 179 (H) 03/20/2017    See Psychiatric Specialty Exam and Suicide Risk Assessment completed by Attending Physician prior to discharge.  Discharge destination:  Home  Is patient on multiple antipsychotic therapies at discharge:  No   Has Patient had three or more failed trials of antipsychotic monotherapy by history:  No  Recommended Plan for Multiple Antipsychotic Therapies: NA   Allergies as of 09/28/2017      Reactions   Anaprox [naproxen Sodium] Shortness Of Breath   Heart palpitations, shortness of breath, generalized swelling      Medication List    TAKE these medications     Indication  amoxicillin-clavulanate 500-125 MG tablet Commonly known as:  AUGMENTIN Take 1 tablet (500 mg total) by mouth 3 (three) times daily. For infection  Indication:  Infection   escitalopram 10 MG tablet Commonly known as:  LEXAPRO Take 1 tablet (10 mg total) by mouth at bedtime. For depression What changed:   additional instructions  Indication:  Major Depressive Disorder   hydrOXYzine 25 MG tablet Commonly known as:  ATARAX/VISTARIL Take 1-2 capsule as needed for anxiety  Indication:  Feeling Anxious   lamoTRIgine 25 MG tablet Commonly known as:  LAMICTAL Take 1 tablet (25 mg total) by mouth 2 (two) times daily. For mood stabilization  Indication:  Mood stabilization   loratadine 10 MG tablet Commonly known as:  CLARITIN Take 1 tablet (10 mg total) by mouth daily. (May purchase from over the counter): For allergies Start taking on:  09/29/2017  Indication:  Perennial Allergic Rhinitis, Hayfever   traZODone 50 MG tablet Commonly known as:  DESYREL Take 1 tablet (50 mg total) by mouth at bedtime as needed for sleep. What changed:    medication strength  how much to take  how to take this  when to take this  reasons to take this  additional instructions  Indication:  Trouble Sleeping      Follow-up Information    [facility]. Go on 09/30/2017.   Specialty:  Behavioral Health Why:  Please attend your therapy appt with Boneta Lucks on Monday, 09/30/17, at 1:00pm. Please attend your medication appt with Dr Lolly Mustache on Tuesday, 10/01/17, at 12:30pm. Contact information: [address] [place] [address] [phone]          Follow-up recommendations:  Activity:  as tolerated Diet:  heart heathly   Comments:  Take all medications as prescribed. Keep all follow-up appointments as scheduled.  Do not consume alcohol or use illegal drugs while on prescription medications. Report any adverse effects from your medications to your primary care provider promptly.  In the event of recurrent symptoms or worsening symptoms, call 911, a crisis hotline, or go to the nearest emergency department for evaluation.   Signed: Oneta Rack, NP 09/28/2017, 3:48 PM

## 2017-09-28 NOTE — Progress Notes (Signed)
  [facility] Discharge Plan :  Will you be returning to the same living situation after discharge:  No.  Will stay with one of her sons or her daughter for awhile while she looks for independent housing At discharge, do you have transportation home?: Yes,  [facility], but will need transport there as she has no shoes Do you have the ability to pay for your medications: Yes,  [employer], income - no barriers expressed  Release of information consent forms completed and turned in to Medical Records by CSW.   Patient to Follow up at: Follow-up Information    [facility]. Go on 09/30/2017.   Specialty:  Behavioral Health Why:  Please attend your therapy appt with Boneta LucksJennifer Brown on Monday, 09/30/17, at 1:00pm. Please attend your medication appt with Dr [name] on Tuesday, 10/01/17, at 12:30pm. Contact information: [address] [place] [id number] [phone]          Next level of care provider [facility] Link:yes  Safety Planning and Suicide Prevention discussed: Yes,  with son  Have you used any form of tobacco in the last 30 days? (Cigarettes, Smokeless Tobacco, Cigars, and/or Pipes): No  Has patient been referred to the Quitline?: N/A patient is not a smoker  Patient has been referred for addiction treatment: N/A  Lynnell ChadMareida J Grossman-Orr, LCSW 09/28/2017, 9:27 AM

## 2017-09-28 NOTE — Progress Notes (Addendum)
Patient is prepared for dc as dc instrucitons are reviewed with her by this Clinical research associatewriter. She states verbal understanding and says " I think I got it right this time". She completes her daily assessment and on it she writes she denies SI today and she rates her depression, hopelessness and anxeity " 0/0/1", respectively. She is given all cc of dc instructions ( SSP, SAR, AVS and SRA) and then she is taken to the search room and all belongings previously in her locker are returned to her and then she  Is [facility] where [facility] parking where her care awaits.

## 2017-09-30 ENCOUNTER — Ambulatory Visit (INDEPENDENT_AMBULATORY_CARE_PROVIDER_SITE_OTHER): Payer: PRIVATE HEALTH INSURANCE | Admitting: Psychiatry

## 2017-09-30 DIAGNOSIS — F3131 Bipolar disorder, current episode depressed, mild: Secondary | ICD-10-CM | POA: Diagnosis not present

## 2017-09-30 NOTE — Progress Notes (Signed)
   THERAPIST PROGRESS NOTE  Session Time:1:10-2:10  Participation Level:Active  Behavioral Response:CasualAlertEuthymic  Type of Therapy:Individual Therapy  Treatment Goals addressed:Coping; Relationship boundaries  Interventions:CBT and Supportive  Summary:Stefanie D Lineberryis a 52 y.o.femalewho presents withBipolar Disorder  Suicidal/Homicidal:Nowithout intent/plan  Therapist Response:Pt. Presents for first session since her suicide attempt last week. Pt. Presents as talkative, responsive to questions for the therapist, insightful regarding the situations and thoughts that led to the attempt. Pt. Discussed that her mother is an ongoing trigger for her depression. Pt. Believes that her mother is a trigger for her impulsive behavior. Pt. Was challenged to not make her mother's behavior the cause of her behavior and to focus on her thoughts related to mother's behavior. Pt. Was able to identify that her mother's behavior triggers thoughts such as "I am unworthy" "I am insignificant" "My life does not matter". Pt. Was able to identify a different thought "My mother has an emotional disability that has nothing to do with me." Pt. Discussed her plans to move to [place], find a new job, and reestablish her relationship with her father. Pt. Was encouraged to do her thought work around her belief that a move will solve this problem and to develop insight around her pattern of thinking that when things happen outside of my control, I can regain control by leaving. Pt. Communicated that she understands that this is faulty thinking and that she needs to learn better communication skills, tolerance of events, and the ability not to internalize the behaviors of others as validation of her self-worth.  Plan: Return again in1week, Continue with CBT based treatment  Diagnosis:Bipolar Affective Disorder    Shaune PollackBrown, Finn Amos B, [facility] 09/30/2017

## 2017-10-01 ENCOUNTER — Encounter (HOSPITAL_COMMUNITY): Payer: Self-pay | Admitting: Psychiatry

## 2017-10-01 ENCOUNTER — Ambulatory Visit (INDEPENDENT_AMBULATORY_CARE_PROVIDER_SITE_OTHER): Payer: PRIVATE HEALTH INSURANCE | Admitting: Psychiatry

## 2017-10-01 ENCOUNTER — Ambulatory Visit (HOSPITAL_COMMUNITY): Payer: PRIVATE HEALTH INSURANCE | Admitting: Psychiatry

## 2017-10-01 VITALS — Wt 206.0 lb

## 2017-10-01 DIAGNOSIS — Z62820 Parent-biological child conflict: Secondary | ICD-10-CM

## 2017-10-01 DIAGNOSIS — F319 Bipolar disorder, unspecified: Secondary | ICD-10-CM | POA: Diagnosis not present

## 2017-10-01 DIAGNOSIS — F4310 Post-traumatic stress disorder, unspecified: Secondary | ICD-10-CM | POA: Diagnosis not present

## 2017-10-01 DIAGNOSIS — T50904A Poisoning by unspecified drugs, medicaments and biological substances, undetermined, initial encounter: Secondary | ICD-10-CM

## 2017-10-01 DIAGNOSIS — Z818 Family history of other mental and behavioral disorders: Secondary | ICD-10-CM

## 2017-10-01 MED ORDER — LAMOTRIGINE 100 MG PO TABS: 100.0000 mg | ORAL_TABLET | Freq: Every day | ORAL | 0 refills | Status: DC
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
## 2017-10-08 ENCOUNTER — Ambulatory Visit (HOSPITAL_COMMUNITY): Payer: PRIVATE HEALTH INSURANCE | Admitting: Psychiatry

## 2017-10-15 ENCOUNTER — Ambulatory Visit (INDEPENDENT_AMBULATORY_CARE_PROVIDER_SITE_OTHER): Payer: PRIVATE HEALTH INSURANCE | Admitting: Psychiatry

## 2017-10-15 ENCOUNTER — Ambulatory Visit (HOSPITAL_COMMUNITY): Payer: Self-pay | Admitting: Psychiatry

## 2017-10-15 DIAGNOSIS — F319 Bipolar disorder, unspecified: Secondary | ICD-10-CM | POA: Diagnosis not present

## 2017-10-17 NOTE — Progress Notes (Signed)
   THERAPIST PROGRESS NOTE  Session Time:1:05-2:00  Participation Level:Active  Behavioral Response:CasualAlertEuthymic  Type of Therapy:Individual Therapy  Treatment Goals addressed:Coping; Relationship boundaries  Interventions:CBT and Supportive  Summary:Stefanie D Lineberryis a 52 y.o.femalewho presents withBipolar Disorder  Suicidal/Homicidal:Nowithout intent/plan  Therapist Response:Pt. Completed treatment plan update. Pt. Presented with brightened affect, talkative, well-groomed, engaged in the group process. Pt. 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 Pt. Was asked not to call any of her co-workers and to provide her manager with her medications and therapy/treatment schedule. Pt. Was advised to contact the EEOC for direction regarding information she should be obligated to give her employer. Pt. Processed the decisions made by her manager and understood that the decisions were most likely made to keep her and her co-workers physically and emotionally safe. Pt. Discussed her relationship with her mother and plans to move out of her mother's home or move to be with her father out of state.   Plan: Return again in1week, Continue with CBT based treatment  Diagnosis:Bipolar Affective Disorder     Shaune Pollack, [facility] 10/17/2017

## 2017-10-30 ENCOUNTER — Other Ambulatory Visit (HOSPITAL_COMMUNITY): Payer: Self-pay

## 2017-10-30 MED ORDER — ESCITALOPRAM OXALATE 10 MG PO TABS: 10.0000 mg | ORAL_TABLET | Freq: Every day | ORAL | 0 refills | Status: DC

## 2017-11-05 ENCOUNTER — Encounter (HOSPITAL_COMMUNITY): Payer: Self-pay | Admitting: Psychiatry

## 2017-11-05 ENCOUNTER — Ambulatory Visit (INDEPENDENT_AMBULATORY_CARE_PROVIDER_SITE_OTHER): Payer: PRIVATE HEALTH INSURANCE | Admitting: Psychiatry

## 2017-11-05 VITALS — BP 127/82 | HR 85 | Resp 12 | Ht 68.0 in | Wt 216.0 lb

## 2017-11-05 DIAGNOSIS — F319 Bipolar disorder, unspecified: Secondary | ICD-10-CM

## 2017-11-05 DIAGNOSIS — F4310 Post-traumatic stress disorder, unspecified: Secondary | ICD-10-CM | POA: Diagnosis not present

## 2017-11-05 DIAGNOSIS — Z818 Family history of other mental and behavioral disorders: Secondary | ICD-10-CM

## 2017-11-05 MED ORDER — HYDROXYZINE HCL 25 MG PO TABS: ORAL_TABLET | ORAL | 1 refills | Status: DC

## 2017-11-05 MED ORDER — ESCITALOPRAM OXALATE 10 MG PO TABS: 10.0000 mg | ORAL_TABLET | Freq: Every day | ORAL | 1 refills | Status: DC

## 2017-11-05 MED ORDER — LAMOTRIGINE 150 MG PO TABS
150.0000 mg | ORAL_TABLET | Freq: Every day | ORAL | 1 refills | Status: DC
Start: 2017-11-05 — End: 2018-01-07
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
## 2017-11-12 ENCOUNTER — Ambulatory Visit (INDEPENDENT_AMBULATORY_CARE_PROVIDER_SITE_OTHER): Payer: PRIVATE HEALTH INSURANCE | Admitting: Psychiatry

## 2017-11-12 DIAGNOSIS — F319 Bipolar disorder, unspecified: Secondary | ICD-10-CM | POA: Diagnosis not present

## 2017-11-13 NOTE — Progress Notes (Signed)
   THERAPIST PROGRESS NOTE   Session Time:1:05-2:00  Participation Level:Active  Behavioral Response:CasualAlertEuthymic  Type of Therapy:Individual Therapy  Treatment Goals addressed:Coping; Relationship boundaries  Interventions:CBT and Supportive  Summary:Stefanie D Lineberryis a 52 y.o.femalewho presents withBipolar Disorder  Suicidal/Homicidal:Nowithout intent/plan  Therapist Response:Pt. Continues to present with bright affect, talkative, well-groomed, engaged in the therapy process. Pt. Discussed that her work satisfaction in high and that she is performing at a high level the patient. Pt. Discussed that she is getting along better with her mother, using her coping skills, and learning how to not need the validation for her behavior from her mother which was the major trigger for her. Pt. Discussed that she has postponed plans to move temporarily because she is helping her daughter out of a financial problem. Pt. Discussed that she is generally happy in her life and hopes to find a healthy relationship, but in the meantime is focused on being happy with herself. As evidence of this patient discussed a recent dinner with her son and she was able to enjoy his company, felt strong, and proud that she was not in crisis.  Plan: Return again in46month, Continue with CBT based treatment  Diagnosis:Bipolar Affective Disorder    Shaune Pollack, [facility] 11/13/2017

## 2017-12-10 ENCOUNTER — Ambulatory Visit (HOSPITAL_COMMUNITY): Payer: Self-pay | Admitting: Psychiatry

## 2017-12-10 ENCOUNTER — Ambulatory Visit (INDEPENDENT_AMBULATORY_CARE_PROVIDER_SITE_OTHER): Payer: PRIVATE HEALTH INSURANCE | Admitting: Psychiatry

## 2017-12-10 DIAGNOSIS — F319 Bipolar disorder, unspecified: Secondary | ICD-10-CM

## 2017-12-12 NOTE — Progress Notes (Signed)
   THERAPIST PROGRESS NOTE  Session Time:1:05-2:00  Participation Level:Active  Behavioral Response:CasualAlertEuthymic  Type of Therapy:Individual Therapy  Treatment Goals addressed:Coping; Relationship boundaries  Interventions:CBT and Supportive  Summary:Stefanie D Lineberryis a 52 y.o.femalewho presents withBipolar Disorder  Suicidal/Homicidal:Nowithout intent/plan  Therapist Response:Pt. Continues to present with bright affect, talkative, well-groomed, engaged in the therapy process. Pt. Discussed that she got a puppy and that is helping her with emotional support. Pt. Discussed that she continues to live with her mother and that her moving has been postponed. Pt. Discussed that she continues to develop empathy for her mother and she believes that she may be exhibiting symptoms of early dementia. Pt. Discussed that she continues to be assertive with her mother which has helped her to manage stress in this relationship, and had to be assertive with her when she got the puppy. Pt. 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 Pt. Discussed her history of bipolar 1 and manic episodes and is able to compare her current behavior with her past. Pt. Discussed that now she is able to place distance between her thoughts and her behaviors such that she is able to make better decisions, review the potential consequences of her decisions, and slow down her behavior.  Plan: Return again in7669month, Continue with CBT based treatment  Diagnosis:Bipolar Affective Disorder     Shaune PollackBrown, Jennifer B, [facility] 12/12/2017

## 2017-12-24 ENCOUNTER — Ambulatory Visit (HOSPITAL_COMMUNITY): Payer: Self-pay | Admitting: Psychiatry

## 2018-01-07 ENCOUNTER — Other Ambulatory Visit (HOSPITAL_COMMUNITY): Payer: Self-pay

## 2018-01-07 DIAGNOSIS — F319 Bipolar disorder, unspecified: Secondary | ICD-10-CM

## 2018-01-07 MED ORDER — LAMOTRIGINE 150 MG PO TABS: 150.0000 mg | ORAL_TABLET | Freq: Every day | ORAL | 1 refills | Status: DC

## 2018-01-21 ENCOUNTER — Ambulatory Visit (INDEPENDENT_AMBULATORY_CARE_PROVIDER_SITE_OTHER): Payer: PRIVATE HEALTH INSURANCE | Admitting: Psychiatry

## 2018-01-21 DIAGNOSIS — F319 Bipolar disorder, unspecified: Secondary | ICD-10-CM

## 2018-01-28 NOTE — Progress Notes (Signed)
   THERAPIST PROGRESS NOTE  Session Time:1:10-2:00  Participation Level:Active  Behavioral Response:CasualAlertEuthymic  Type of Therapy:Individual Therapy  Treatment Goals addressed:Coping; Relationship boundaries  Interventions:CBT and Supportive  Summary:Dajia D Lineberryis a 52 y.o.femalewho presents withBipolar Disorder  Suicidal/Homicidal:Nowithout intent/plan  Therapist Response:Pt. Continues to presentwith bright affect, talkative, well-groomed, engaged in thetherapyprocess. Since our last session Pt. 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 Pt. Brought in several pieces of her artwork to share with the clinician. Pt. Explained the significance of each piece to her, especially pieces that commemorated her transitions through her recovery process. Pt. Discussed that she has displayed her art at work and has become an opportunity to engage her co-workers about mental health education. Pt. Reports that she continues to do well at work, is very happy with her productivity and feedback that she is receiving from her managers. Pt. Discussed that her relationship with her mother continues to improve which she attributes to continuing to set healthy boundaries in relationship with her mother. Pt. Discussed example of weekly outings with her family to eat and her choice of restaurant is never chosen. Pt. Was able to address the issue with her mother in a non-aggressive manner. Pt. Made the decision that she would no longer go on the outings every week, but only when she truly wanted to go and would enjoy the meal. Pt. Discussed that she no longer has plans to move to [place] due to relationship with father and his request that she not have female guests in her home. Pt concluded that moving to [place] with restrictions that were not age appropriate would not be healthy for her.  Plan: Return again in4613month, Continue with CBT based treatment  Diagnosis:Bipolar Affective  Disorder   Shaune PollackBrown, Laticia Vannostrand B, [facility] 01/28/2018

## 2018-02-26 ENCOUNTER — Encounter (HOSPITAL_COMMUNITY): Payer: Self-pay | Admitting: Psychiatry

## 2018-03-18 ENCOUNTER — Ambulatory Visit (INDEPENDENT_AMBULATORY_CARE_PROVIDER_SITE_OTHER): Payer: PRIVATE HEALTH INSURANCE | Admitting: Licensed Clinical Social Worker

## 2018-03-18 DIAGNOSIS — F319 Bipolar disorder, unspecified: Secondary | ICD-10-CM | POA: Diagnosis not present

## 2018-03-19 NOTE — Progress Notes (Signed)
   THERAPIST PROGRESS NOTE  Session Time: 4:15pm-5:15pm  Participation Level: Active  Behavioral Response: Well GroomedAlertAnxious, Euthymic and Irritable  Type of Therapy: Individual Therapy  Treatment Goals addressed: Coping  Interventions: CBT, Motivational Interviewing and Supportive  Summary: Stefanie Galvan is a 52 y.o. female who presents with Bipolar 1 disorder, depressed. Client verbalized frustration with short notice change in providers for therapy. Client did not identify a specific symptom of focus reporting she needed therapy to hold herself accountable. Client updated clinician and use of boundaries with several relationships and skills she is currently implementing. Client verbalized that with the sudden change in therapist she became anxious due to previous 'trust issues' and reports she is unsure what makes a person trustworthy, only a gut feeling she has. Client agrees to return for therapy in 3-4 weeks.  Suicidal/Homicidal: Nowithout intent/plan  Therapist Response: Clinician validated client's feelings of frustration with changing therapist and doctor appointments being pushed out. Clinician inquired about goals for therapy and what client wanted to get out of therapy. Clinician utilized time for rapport building and reviewing current helpful coping skills. Clinician assessed for SI/HI/psychosis/substance abuse and medication compliance.  Plan: Return again in 3-4 weeks.  Diagnosis: Axis I: Bipolar, Depressed        Harlon Ditty, LCSW 03/19/2018

## 2018-03-28 ENCOUNTER — Other Ambulatory Visit (HOSPITAL_COMMUNITY): Payer: Self-pay

## 2018-03-28 DIAGNOSIS — F319 Bipolar disorder, unspecified: Secondary | ICD-10-CM

## 2018-03-28 MED ORDER — LAMOTRIGINE 150 MG PO TABS: 150.0000 mg | ORAL_TABLET | Freq: Every day | ORAL | 0 refills | Status: DC

## 2018-03-28 MED ORDER — HYDROXYZINE HCL 25 MG PO TABS: ORAL_TABLET | ORAL | 0 refills | Status: DC

## 2018-04-03 ENCOUNTER — Encounter

## 2018-04-03 ENCOUNTER — Other Ambulatory Visit: Payer: Self-pay

## 2018-04-03 ENCOUNTER — Ambulatory Visit (INDEPENDENT_AMBULATORY_CARE_PROVIDER_SITE_OTHER): Payer: PRIVATE HEALTH INSURANCE | Admitting: Psychiatry

## 2018-04-03 ENCOUNTER — Encounter (HOSPITAL_COMMUNITY): Payer: Self-pay | Admitting: Psychiatry

## 2018-04-03 DIAGNOSIS — F4310 Post-traumatic stress disorder, unspecified: Secondary | ICD-10-CM | POA: Diagnosis not present

## 2018-04-03 DIAGNOSIS — F319 Bipolar disorder, unspecified: Secondary | ICD-10-CM | POA: Diagnosis not present

## 2018-04-03 MED ORDER — LAMOTRIGINE 150 MG PO TABS: 150.0000 mg | ORAL_TABLET | Freq: Every day | ORAL | 0 refills | Status: DC

## 2018-04-03 MED ORDER — HYDROXYZINE HCL 25 MG PO TABS: ORAL_TABLET | ORAL | 1 refills | Status: DC

## 2018-04-03 MED ORDER — ESCITALOPRAM OXALATE 10 MG PO TABS: 10.0000 mg | ORAL_TABLET | Freq: Every day | ORAL | 0 refills | Status: DC
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
## 2018-04-15 ENCOUNTER — Ambulatory Visit (INDEPENDENT_AMBULATORY_CARE_PROVIDER_SITE_OTHER): Payer: PRIVATE HEALTH INSURANCE | Admitting: Licensed Clinical Social Worker

## 2018-04-15 DIAGNOSIS — F319 Bipolar disorder, unspecified: Secondary | ICD-10-CM

## 2018-04-17 NOTE — Progress Notes (Signed)
   THERAPIST PROGRESS NOTE  Session Time: 3:05pm-4:05pm  Participation Level: Active  Behavioral Response: Well GroomedAlertEuthymic  Type of Therapy: Individual Therapy  Treatment Goals addressed: Coping  Interventions: CBT, Motivational Interviewing and Supportive  Summary: Stefanie Galvan is a 52 y.o. female who presents with current diagnosis of bipolar 1 disorder with currently stable symtpoms. Client reports no concerns related to increase an manic or depressive symptoms. Client reports mild concern about her judgement in starting a new relationship. Client verbalized boundaries put in place and plans to maintain individuality. Client reports this is the first time she will be going into a relationship open as 'herself' not trying to change to please someone else or maintain something she is not to avoid rejection. Client shared paintings related to previous feelings and stages in life reporting painting is her version of journaling.  Suicidal/Homicidal: Nowithout intent/plan  Therapist Response: Client provided active listening and reflective statements. Clinician asked clarifying questions related to client values and goals for the relationship and maintaining autonomy. Clinician encouraged client to continue with painting/journaling as healthy form of expression of feelings. Clinician encouraged client to continue identifying specific thoughts and expectations relating to 'big' feelings.  Plan: Return again in 3-4 weeks.  Diagnosis: Axis I: Bipolar, Depressed    Harlon Ditty, LCSW  04/15/2018

## 2018-07-01 ENCOUNTER — Observation Stay: Payer: PRIVATE HEALTH INSURANCE | Admitting: Anesthesiology

## 2018-07-01 ENCOUNTER — Observation Stay
Admission: EM | Admit: 2018-07-01 | Discharge: 2018-07-02 | Disposition: A | Discharge status: Home / Self Care | Payer: PRIVATE HEALTH INSURANCE | Attending: Surgery | Admitting: Surgery

## 2018-07-01 ENCOUNTER — Encounter
Admission: EM | Disposition: A | Discharge status: Home / Self Care | Payer: Self-pay | Source: Home / Self Care | Attending: Emergency Medicine

## 2018-07-01 ENCOUNTER — Ambulatory Visit (HOSPITAL_COMMUNITY): Payer: PRIVATE HEALTH INSURANCE | Admitting: Psychiatry

## 2018-07-01 ENCOUNTER — Other Ambulatory Visit: Payer: Self-pay

## 2018-07-01 ENCOUNTER — Encounter: Payer: Self-pay | Admitting: Medical Oncology

## 2018-07-01 DIAGNOSIS — Z87820 Personal history of traumatic brain injury: Secondary | ICD-10-CM | POA: Insufficient documentation

## 2018-07-01 DIAGNOSIS — K8050 Calculus of bile duct without cholangitis or cholecystitis without obstruction: Secondary | ICD-10-CM | POA: Diagnosis present

## 2018-07-01 DIAGNOSIS — M1990 Unspecified osteoarthritis, unspecified site: Secondary | ICD-10-CM | POA: Insufficient documentation

## 2018-07-01 DIAGNOSIS — F419 Anxiety disorder, unspecified: Secondary | ICD-10-CM | POA: Diagnosis not present

## 2018-07-01 DIAGNOSIS — F3181 Bipolar II disorder: Secondary | ICD-10-CM | POA: Insufficient documentation

## 2018-07-01 DIAGNOSIS — Z79899 Other long term (current) drug therapy: Secondary | ICD-10-CM | POA: Diagnosis not present

## 2018-07-01 DIAGNOSIS — Z886 Allergy status to analgesic agent status: Secondary | ICD-10-CM | POA: Diagnosis not present

## 2018-07-01 DIAGNOSIS — K8012 Calculus of gallbladder with acute and chronic cholecystitis without obstruction: Principal | ICD-10-CM | POA: Insufficient documentation

## 2018-07-01 DIAGNOSIS — F4312 Post-traumatic stress disorder, chronic: Secondary | ICD-10-CM | POA: Diagnosis not present

## 2018-07-01 HISTORY — PX: CHOLECYSTECTOMY: SHX55

## 2018-07-01 LAB — COMPREHENSIVE METABOLIC PANEL WITH GFR
ALT: 22 U/L (ref 0–44)
AST: 19 U/L (ref 15–41)
Albumin: 4.6 g/dL (ref 3.5–5.0)
Alkaline Phosphatase: 67 U/L (ref 38–126)
Anion gap: 8 (ref 5–15)
BUN: 13 mg/dL (ref 6–20)
CO2: 25 mmol/L (ref 22–32)
Calcium: 9.1 mg/dL (ref 8.9–10.3)
Chloride: 106 mmol/L (ref 98–111)
Creatinine, Ser: 0.68 mg/dL (ref 0.44–1.00)
GFR calc Af Amer: >60 mL/min
GFR calc non Af Amer: >60 mL/min
Glucose, Bld: 104 mg/dL — ABNORMAL HIGH (ref 70–99)
Potassium: 3.6 mmol/L (ref 3.5–5.1)
Sodium: 139 mmol/L (ref 135–145)
Total Bilirubin: 0.6 mg/dL (ref 0.3–1.2)
Total Protein: 7.7 g/dL (ref 6.5–8.1)

## 2018-07-01 LAB — CBC WITH DIFFERENTIAL/PLATELET
Abs Immature Granulocytes: 0.01 K/uL (ref 0.00–0.07)
Basophils Absolute: 0 K/uL (ref 0.0–0.1)
Basophils Relative: 1 %
Eosinophils Absolute: 0.2 K/uL (ref 0.0–0.5)
Eosinophils Relative: 3 %
HCT: 42.6 % (ref 36.0–46.0)
Hemoglobin: 14.4 g/dL (ref 12.0–15.0)
Immature Granulocytes: 0 %
Lymphocytes Relative: 38 %
Lymphs Abs: 2 K/uL (ref 0.7–4.0)
MCH: 27.4 pg (ref 26.0–34.0)
MCHC: 33.8 g/dL (ref 30.0–36.0)
MCV: 81 fL (ref 80.0–100.0)
Monocytes Absolute: 0.3 K/uL (ref 0.1–1.0)
Monocytes Relative: 5 %
Neutro Abs: 2.7 K/uL (ref 1.7–7.7)
Neutrophils Relative %: 53 %
Platelets: 273 K/uL (ref 150–400)
RBC: 5.26 MIL/uL — ABNORMAL HIGH (ref 3.87–5.11)
RDW: 12.8 % (ref 11.5–15.5)
WBC: 5.2 K/uL (ref 4.0–10.5)
nRBC: 0 % (ref 0.0–0.2)

## 2018-07-01 LAB — URINALYSIS, COMPLETE (UACMP) WITH MICROSCOPIC
Bacteria, UA: NONE SEEN
Bilirubin Urine: NEGATIVE
Glucose, UA: NEGATIVE mg/dL
Ketones, ur: NEGATIVE mg/dL
Leukocytes, UA: NEGATIVE
Nitrite: NEGATIVE
Protein, ur: NEGATIVE mg/dL
Specific Gravity, Urine: 1.003 — ABNORMAL LOW (ref 1.005–1.030)
pH: 6 (ref 5.0–8.0)

## 2018-07-01 LAB — LIPASE, BLOOD: LIPASE: 49 U/L (ref 11–51)

## 2018-07-01 SURGERY — LAPAROSCOPIC CHOLECYSTECTOMY
Anesthesia: General

## 2018-07-01 MED ORDER — SUCCINYLCHOLINE CHLORIDE 20 MG/ML IJ SOLN
INTRAMUSCULAR | Status: DC | PRN
  Administered 2018-07-01: 100 mg via INTRAVENOUS

## 2018-07-01 MED ORDER — OXYCODONE HCL 5 MG PO TABS: 5.0000 mg | ORAL_TABLET | Freq: Once | ORAL | Status: DC | PRN

## 2018-07-01 MED ORDER — ONDANSETRON HCL 4 MG/2ML IJ SOLN
INTRAMUSCULAR | Status: DC | PRN
  Administered 2018-07-01: 4 mg via INTRAVENOUS

## 2018-07-01 MED ORDER — LACTATED RINGERS IV SOLN
INTRAVENOUS | Status: DC
  Administered 2018-07-01: via INTRAVENOUS

## 2018-07-01 MED ORDER — OXYCODONE HCL 5 MG/5ML PO SOLN: 5.0000 mg | Freq: Once | ORAL | Status: DC | PRN

## 2018-07-01 MED ORDER — FENTANYL CITRATE (PF) 100 MCG/2ML IJ SOLN
INTRAMUSCULAR | Status: AC
  Administered 2018-07-01: 100 ug via INTRAVENOUS
  Filled 2018-07-01: qty 2

## 2018-07-01 MED ORDER — DOCUSATE SODIUM 100 MG PO CAPS: 100.0000 mg | ORAL_CAPSULE | Freq: Two times a day (BID) | ORAL | Status: DC | PRN

## 2018-07-01 MED ORDER — HYDROMORPHONE HCL 1 MG/ML IJ SOLN
0.5000 mg | Freq: Once | INTRAMUSCULAR | Status: AC
  Administered 2018-07-01: 0.5 mg via INTRAVENOUS
  Filled 2018-07-01: qty 1

## 2018-07-01 MED ORDER — FENTANYL CITRATE (PF) 100 MCG/2ML IJ SOLN
INTRAMUSCULAR | Status: DC | PRN
  Administered 2018-07-01 (×2): 50 ug via INTRAVENOUS

## 2018-07-01 MED ORDER — ACETAMINOPHEN 10 MG/ML IV SOLN
INTRAVENOUS | Status: DC | PRN
  Administered 2018-07-01: 1000 mg via INTRAVENOUS

## 2018-07-01 MED ORDER — SEVOFLURANE IN SOLN
RESPIRATORY_TRACT | Status: AC
  Filled 2018-07-01: qty 250

## 2018-07-01 MED ORDER — FENTANYL CITRATE (PF) 100 MCG/2ML IJ SOLN
INTRAMUSCULAR | Status: AC
  Administered 2018-07-01: 25 ug via INTRAVENOUS
  Filled 2018-07-01: qty 2

## 2018-07-01 MED ORDER — HYDROXYZINE HCL 25 MG PO TABS: 25.0000 mg | ORAL_TABLET | Freq: Every day | ORAL | Status: DC | PRN

## 2018-07-01 MED ORDER — ONDANSETRON HCL 4 MG/2ML IJ SOLN
INTRAMUSCULAR | Status: AC
  Administered 2018-07-01: 4 mg via INTRAVENOUS
  Filled 2018-07-01: qty 2

## 2018-07-01 MED ORDER — ONDANSETRON HCL 4 MG/2ML IJ SOLN
4.0000 mg | Freq: Once | INTRAMUSCULAR | Status: AC
  Administered 2018-07-01: 4 mg via INTRAVENOUS

## 2018-07-01 MED ORDER — MIDAZOLAM HCL 2 MG/2ML IJ SOLN
INTRAMUSCULAR | Status: DC | PRN
  Administered 2018-07-01: 2 mg via INTRAVENOUS

## 2018-07-01 MED ORDER — HYDROMORPHONE HCL 1 MG/ML IJ SOLN
0.2500 mg | INTRAMUSCULAR | Status: DC | PRN
  Administered 2018-07-01 (×4): 0.25 mg via INTRAVENOUS

## 2018-07-01 MED ORDER — LIDOCAINE HCL (CARDIAC) PF 100 MG/5ML IV SOSY
PREFILLED_SYRINGE | INTRAVENOUS | Status: DC | PRN
  Administered 2018-07-01: 80 mg via INTRAVENOUS

## 2018-07-01 MED ORDER — LORATADINE 10 MG PO TABS
10.0000 mg | ORAL_TABLET | Freq: Every day | ORAL | Status: DC
  Filled 2018-07-01: qty 1

## 2018-07-01 MED ORDER — EPHEDRINE SULFATE 50 MG/ML IJ SOLN
INTRAMUSCULAR | Status: DC | PRN
  Administered 2018-07-01 (×4): 5 mg via INTRAVENOUS

## 2018-07-01 MED ORDER — ESCITALOPRAM OXALATE 10 MG PO TABS
10.0000 mg | ORAL_TABLET | Freq: Every day | ORAL | Status: DC
  Administered 2018-07-01: 10 mg via ORAL
  Filled 2018-07-01: qty 1

## 2018-07-01 MED ORDER — SODIUM CHLORIDE 0.9 % IV SOLN
INTRAVENOUS | Status: DC | PRN
  Administered 2018-07-01: 17:00:00 via INTRAVENOUS

## 2018-07-01 MED ORDER — ACETAMINOPHEN 10 MG/ML IV SOLN
INTRAVENOUS | Status: AC
  Filled 2018-07-01: qty 100

## 2018-07-01 MED ORDER — MORPHINE SULFATE (PF) 2 MG/ML IV SOLN: 2.0000 mg | INTRAVENOUS | Status: DC | PRN

## 2018-07-01 MED ORDER — ONDANSETRON HCL 4 MG/2ML IJ SOLN
4.0000 mg | Freq: Once | INTRAMUSCULAR | Status: AC
Start: 2018-07-01 — End: 2018-07-01
  Administered 2018-07-01: 4 mg via INTRAVENOUS
  Filled 2018-07-01: qty 2

## 2018-07-01 MED ORDER — LIDOCAINE-EPINEPHRINE (PF) 1 %-1:200000 IJ SOLN
INTRAMUSCULAR | Status: DC | PRN
  Administered 2018-07-01: 20 mL via INTRADERMAL

## 2018-07-01 MED ORDER — DEXAMETHASONE SODIUM PHOSPHATE 10 MG/ML IJ SOLN
INTRAMUSCULAR | Status: DC | PRN
  Administered 2018-07-01: 10 mg via INTRAVENOUS

## 2018-07-01 MED ORDER — ENOXAPARIN SODIUM 40 MG/0.4ML ~~LOC~~ SOLN: 40.0000 mg | SUBCUTANEOUS | Status: DC

## 2018-07-01 MED ORDER — MORPHINE SULFATE (PF) 4 MG/ML IV SOLN
4.0000 mg | Freq: Once | INTRAVENOUS | Status: AC
  Administered 2018-07-01: 4 mg via INTRAVENOUS
  Filled 2018-07-01: qty 1

## 2018-07-01 MED ORDER — ACETAMINOPHEN 500 MG PO TABS
1000.0000 mg | ORAL_TABLET | Freq: Four times a day (QID) | ORAL | Status: DC
  Administered 2018-07-01 – 2018-07-02 (×2): 1000 mg via ORAL
  Filled 2018-07-01 (×2): qty 2

## 2018-07-01 MED ORDER — FENTANYL CITRATE (PF) 100 MCG/2ML IJ SOLN
25.0000 ug | INTRAMUSCULAR | Status: AC | PRN
  Administered 2018-07-01 (×6): 25 ug via INTRAVENOUS

## 2018-07-01 MED ORDER — FENTANYL CITRATE (PF) 100 MCG/2ML IJ SOLN
INTRAMUSCULAR | Status: AC
  Filled 2018-07-01: qty 2

## 2018-07-01 MED ORDER — FENTANYL CITRATE (PF) 100 MCG/2ML IJ SOLN
100.0000 ug | Freq: Once | INTRAMUSCULAR | Status: AC
  Administered 2018-07-01: 100 ug via INTRAVENOUS

## 2018-07-01 MED ORDER — LIDOCAINE-EPINEPHRINE (PF) 1 %-1:200000 IJ SOLN
INTRAMUSCULAR | Status: AC
  Filled 2018-07-01: qty 30

## 2018-07-01 MED ORDER — LAMOTRIGINE 25 MG PO TABS
150.0000 mg | ORAL_TABLET | Freq: Every day | ORAL | Status: DC
  Administered 2018-07-01: 150 mg via ORAL
  Filled 2018-07-01: qty 6

## 2018-07-01 MED ORDER — SUGAMMADEX SODIUM 200 MG/2ML IV SOLN
INTRAVENOUS | Status: DC | PRN
  Administered 2018-07-01: 200 mg via INTRAVENOUS

## 2018-07-01 MED ORDER — PROPOFOL 10 MG/ML IV BOLUS
INTRAVENOUS | Status: DC | PRN
  Administered 2018-07-01: 160 mg via INTRAVENOUS

## 2018-07-01 MED ORDER — PROPOFOL 10 MG/ML IV BOLUS
INTRAVENOUS | Status: AC
  Filled 2018-07-01: qty 20

## 2018-07-01 MED ORDER — MIDAZOLAM HCL 2 MG/2ML IJ SOLN
INTRAMUSCULAR | Status: AC
  Filled 2018-07-01: qty 2

## 2018-07-01 MED ORDER — ROCURONIUM BROMIDE 100 MG/10ML IV SOLN
INTRAVENOUS | Status: DC | PRN
  Administered 2018-07-01: 40 mg via INTRAVENOUS
  Administered 2018-07-01: 10 mg via INTRAVENOUS

## 2018-07-01 MED ORDER — BUPIVACAINE HCL (PF) 0.5 % IJ SOLN
INTRAMUSCULAR | Status: AC
  Filled 2018-07-01: qty 30

## 2018-07-01 MED ORDER — HYDROMORPHONE HCL 1 MG/ML IJ SOLN
INTRAMUSCULAR | Status: AC
  Administered 2018-07-01: 0.25 mg via INTRAVENOUS
  Filled 2018-07-01: qty 1

## 2018-07-01 MED ORDER — SODIUM CHLORIDE 0.9 % IV SOLN
2.0000 g | INTRAVENOUS | Status: DC
  Filled 2018-07-01 (×2): qty 20

## 2018-07-01 SURGICAL SUPPLY — 62 items
ADH SKN CLS APL DERMABOND .7 (GAUZE/BANDAGES/DRESSINGS) ×1
ANCHOR TIS RET SYS 235ML (MISCELLANEOUS) ×3 IMPLANT
APL SWBSTK 6 STRL LF DISP (MISCELLANEOUS)
APPLICATOR COTTON TIP 6 STRL (MISCELLANEOUS) IMPLANT
APPLICATOR COTTON TIP 6IN STRL (MISCELLANEOUS)
APPLIER CLIP 5 13 M/L LIGAMAX5 (MISCELLANEOUS) ×3
APR CLP MED LRG 5 ANG JAW (MISCELLANEOUS) ×1
BAG TISS RTRVL C235 10X14 (MISCELLANEOUS) ×1
BLADE SURG SZ11 CARB STEEL (BLADE) ×3 IMPLANT
CANISTER SUCT 1200ML W/VALVE (MISCELLANEOUS) ×3 IMPLANT
CHLORAPREP W/TINT 26ML (MISCELLANEOUS) ×3 IMPLANT
CHOLANGIOGRAM CATH TAUT (CATHETERS) IMPLANT
CLIP APPLIE 5 13 M/L LIGAMAX5 (MISCELLANEOUS) ×1 IMPLANT
COVER WAND RF STERILE (DRAPES) ×3 IMPLANT
DECANTER SPIKE VIAL GLASS SM (MISCELLANEOUS) ×2 IMPLANT
DEFOGGER SCOPE WARMER CLEARIFY (MISCELLANEOUS) IMPLANT
DERMABOND ADVANCED (GAUZE/BANDAGES/DRESSINGS) ×2
DERMABOND ADVANCED .7 DNX12 (GAUZE/BANDAGES/DRESSINGS) ×1 IMPLANT
DISSECTOR BLUNT TIP ENDO 5MM (MISCELLANEOUS) IMPLANT
DISSECTOR KITTNER STICK (MISCELLANEOUS) IMPLANT
DISSECTORS/KITTNER STICK (MISCELLANEOUS)
DRAPE C-ARM XRAY 36X54 (DRAPES) IMPLANT
DRAPE SHEET LG 3/4 BI-LAMINATE (DRAPES) IMPLANT
ELECT CAUTERY BLADE 6.4 (BLADE) IMPLANT
ELECT REM PT RETURN 9FT ADLT (ELECTROSURGICAL) ×3
ELECTRODE REM PT RTRN 9FT ADLT (ELECTROSURGICAL) ×1 IMPLANT
GLOVE BIOGEL PI IND STRL 7.0 (GLOVE) ×1 IMPLANT
GLOVE BIOGEL PI INDICATOR 7.0 (GLOVE) ×2
GLOVE SURG SYN 7.0 (GLOVE) ×3 IMPLANT
GLOVE SURG SYN 7.0 PF PI (GLOVE) ×1 IMPLANT
GOWN STRL REUS W/ TWL LRG LVL3 (GOWN DISPOSABLE) ×3 IMPLANT
GOWN STRL REUS W/TWL LRG LVL3 (GOWN DISPOSABLE) ×9
GRASPER SUT TROCAR 14GX15 (MISCELLANEOUS) ×3 IMPLANT
HEMOSTAT SURGICEL 2X3 (HEMOSTASIS) ×2 IMPLANT
IRRIGATION STRYKERFLOW (MISCELLANEOUS) IMPLANT
IRRIGATOR STRYKERFLOW (MISCELLANEOUS)
IV CATH ANGIO 12GX3 LT BLUE (NEEDLE) IMPLANT
IV NS 1000ML (IV SOLUTION)
IV NS 1000ML BAXH (IV SOLUTION) IMPLANT
JACKSON PRATT 10 (INSTRUMENTS) IMPLANT
L-HOOK LAP DISP 36CM (ELECTROSURGICAL) ×3
LHOOK LAP DISP 36CM (ELECTROSURGICAL) ×1 IMPLANT
NEEDLE HYPO 22GX1.5 SAFETY (NEEDLE) ×3 IMPLANT
PACK LAP CHOLECYSTECTOMY (MISCELLANEOUS) ×3 IMPLANT
PENCIL ELECTRO HAND CTR (MISCELLANEOUS) ×3 IMPLANT
PORT ACCESS TROCAR AIRSEAL 5 (TROCAR) ×3 IMPLANT
SCISSORS METZENBAUM CVD 33 (INSTRUMENTS) ×3 IMPLANT
SET TRI-LUMEN FLTR TB AIRSEAL (TUBING) ×3 IMPLANT
SLEEVE ENDOPATH XCEL 5M (ENDOMECHANICALS) ×3 IMPLANT
SPONGE LAP 18X18 RF (DISPOSABLE) IMPLANT
STOPCOCK 4 WAY LG BORE MALE ST (IV SETS) IMPLANT
SUT MNCRL 4-0 (SUTURE) ×3
SUT MNCRL 4-0 27XMFL (SUTURE) ×1
SUT VIC AB 3-0 SH 27 (SUTURE)
SUT VIC AB 3-0 SH 27X BRD (SUTURE) IMPLANT
SUT VICRYL 0 AB UR-6 (SUTURE) ×6 IMPLANT
SUTURE MNCRL 4-0 27XMF (SUTURE) ×1 IMPLANT
SYR 20CC LL (SYRINGE) ×3 IMPLANT
TOWEL OR 17X26 4PK STRL BLUE (TOWEL DISPOSABLE) ×3 IMPLANT
TROCAR XCEL BLUNT TIP 100MML (ENDOMECHANICALS) ×3 IMPLANT
TROCAR XCEL NON-BLD 5MMX100MML (ENDOMECHANICALS) ×3 IMPLANT
WATER STERILE IRR 1000ML POUR (IV SOLUTION) ×3 IMPLANT

## 2018-07-01 NOTE — Anesthesia Preprocedure Evaluation (Signed)
Anesthesia Evaluation  Patient identified by MRN, date of birth, ID band Patient awake    Reviewed: Allergy & Precautions, H&P , NPO status , Patient's Chart, lab work & pertinent test results  History of Anesthesia Complications Negative for: history of anesthetic complications  Airway Mallampati: III  TM Distance: <3 FB Neck ROM: full    Dental  (+) Chipped   Pulmonary neg pulmonary ROS, neg shortness of breath,           Cardiovascular Exercise Tolerance: Good (-) angina(-) Past MI and (-) DOE negative cardio ROS       Neuro/Psych Seizures -,  PSYCHIATRIC DISORDERS Seizure x 1 after her TBI    GI/Hepatic negative GI ROS, Neg liver ROS, neg GERD  ,  Endo/Other  negative endocrine ROS  Renal/GU      Musculoskeletal  (+) Arthritis ,   Abdominal   Peds  Hematology negative hematology ROS (+)   Anesthesia Other Findings Past Medical History: No date: Anxiety No date: Arthritis 2.5 yrs ago: Depression     Comment:  situational dep-no current tx/meds 11/10/2005: TBI (traumatic brain injury) [facility])  Past Surgical History: 02/25/2012: BREAST REDUCTION SURGERY     Comment:  Procedure: MAMMARY REDUCTION  (BREAST);  Surgeon: Louisa Second, MD;  Location: [facility];                Service: Plastics;  Laterality: Bilateral;  bilateral               reduction 2001: DILATION AND CURETTAGE OF UTERUS 1984; 1996: TEMPOROMANDIBULAR JOINT SURGERY     Comment:  X 2; bilateral 2003: TUBAL LIGATION  BMI    Body Mass Index:  32.99 kg/m      Reproductive/Obstetrics negative OB ROS                             Anesthesia Physical Anesthesia Plan  ASA: III  Anesthesia Plan: General ETT, Rapid Sequence and Cricoid Pressure   Post-op Pain Management:    Induction: Intravenous and Rapid sequence  PONV Risk Score and Plan: Ondansetron, Dexamethasone, Midazolam and  Treatment may vary due to age or medical condition  Airway Management Planned: Oral ETT  Additional Equipment:   Intra-op Plan:   Post-operative Plan: Extubation in OR  Informed Consent: I have reviewed the patients History and Physical, chart, labs and discussed the procedure including the risks, benefits and alternatives for the proposed anesthesia with the patient or authorized representative who has indicated his/her understanding and acceptance.     Dental Advisory Given  Plan Discussed with: Anesthesiologist, CRNA and Surgeon  Anesthesia Plan Comments: (Patient consented for risks of anesthesia including but not limited to:  - adverse reactions to medications - damage to teeth, lips or other oral mucosa - sore throat or hoarseness - Damage to heart, brain, lungs or loss of life  Patient voiced understanding.)        Anesthesia Quick Evaluation

## 2018-07-01 NOTE — ED Notes (Signed)
Patient transported to OR.

## 2018-07-01 NOTE — ED Provider Notes (Signed)
[facility] Emergency Department Provider Note       Time seen: ----------------------------------------- 7:18 AM on 07/01/2018 -----------------------------------------   I have reviewed the triage vital signs and the nursing notes.  HISTORY   Chief Complaint Abdominal Pain and Emesis    HPI Stefanie Galvan is a 53 y.o. female with a history of anxiety, arthritis, depression, traumatic brain injury, bipolar disorder who presents to the ED for right upper quadrant and right flank pain since Friday.  Patient saw her primary care doctor and was told was possibly her gallbladder.  She had an ultrasound done on Friday but has not seen the results of that yet.  Patient reports pain worsened this morning she began vomiting.  She denies any fever.  Past Medical History:  Diagnosis Date  . Anxiety   . Arthritis   . Depression 2.5 yrs ago   situational dep-no current tx/meds  . TBI (traumatic brain injury) (HCC) 11/10/2005    Patient Active Problem List   Diagnosis Date Noted  . Bipolar affective (HCC) 09/24/2017  . Lithium overdose 09/23/2017  . Adjustment disorder with mixed disturbance of emotions and conduct   . Lithium overdose, intentional self-harm, initial encounter (HCC) 09/22/2017  . QT prolongation 09/22/2017  . Bipolar 1 disorder, depressed (HCC) 03/19/2017  . Bipolar 2 disorder, major depressive episode (HCC) 03/04/2017    Class: Chronic  . Chronic post-traumatic stress disorder (PTSD) 03/04/2017    Class: Chronic  . DDD (degenerative disc disease), lumbar 03/04/2017  . Arthritis 08/29/2016    Past Surgical History:  Procedure Laterality Date  . BREAST REDUCTION SURGERY  02/25/2012   Procedure: MAMMARY REDUCTION  (BREAST);  Surgeon: Louisa Second, MD;  Location: [facility];  Service: Plastics;  Laterality: Bilateral;  bilateral reduction  . DILATION AND CURETTAGE OF UTERUS  2001  . TEMPOROMANDIBULAR JOINT SURGERY   1984; 1996   X 2; bilateral  . TUBAL LIGATION  2003    Allergies Anaprox [naproxen sodium]  Social History Social History   Tobacco Use  . Smoking status: Never Smoker  . Smokeless tobacco: Never Used  Substance Use Topics  . Alcohol use: Yes    Comment: Occasional drink   . Drug use: No   Review of Systems Constitutional: Negative for fever. Cardiovascular: Negative for chest pain. Respiratory: Negative for shortness of breath. Gastrointestinal: Positive for abdominal pain, vomiting Musculoskeletal: Negative for back pain. Skin: Negative for rash. Neurological: Negative for headaches, focal weakness or numbness.  All systems negative/normal/unremarkable except as stated in the HPI  ____________________________________________   PHYSICAL EXAM:  VITAL SIGNS: ED Triage Vitals  Enc Vitals Group     BP 07/01/18 0709 (!) 146/102     Pulse Rate 07/01/18 0709 (!) 106     Resp 07/01/18 0709 20     Temp 07/01/18 0709 98.4 F (36.9 C)     Temp Source 07/01/18 0709 Oral     SpO2 07/01/18 0709 97 %     Weight 07/01/18 0710 217 lb (98.4 kg)     Height 07/01/18 0710 5\' 8"  (1.727 m)     Head Circumference --      Peak Flow --      Pain Score 07/01/18 0710 8     Pain Loc --      Pain Edu? --      Excl. in GC? --    Constitutional: Alert and oriented.  Mild distress from pain Eyes: Conjunctivae are normal. Normal extraocular movements.  ENT      Head: Normocephalic and atraumatic.      Nose: No congestion/rhinnorhea.      Mouth/Throat: Mucous membranes are moist.      Neck: No stridor. Cardiovascular: Normal rate, regular rhythm. No murmurs, rubs, or gallops. Respiratory: Normal respiratory effort without tachypnea nor retractions. Breath sounds are clear and equal bilaterally. No wheezes/rales/rhonchi. Gastrointestinal: Upper quadrant tenderness, positive Murphy sign Musculoskeletal: Nontender with normal range of motion in extremities. No lower extremity tenderness nor  edema. Neurologic:  Normal speech and language. No gross focal neurologic deficits are appreciated.  Skin:  Skin is warm, dry and intact. No rash noted. Psychiatric: Mood and affect are normal. Speech and behavior are normal.  ____________________________________________  ED COURSE:  As part of my medical decision making, I reviewed the following data within the electronic MEDICAL RECORD NUMBER History obtained from family if available, nursing notes, old chart and ekg, as well as notes from prior ED visits. Patient presented for right upper quadrant pain, we will assess with labs and imaging as indicated at this time.   Procedures ____________________________________________   LABS (pertinent positives/negatives)  Labs Reviewed  CBC WITH DIFFERENTIAL/PLATELET - Abnormal; Notable for the following components:      Result Value   RBC 5.26 (*)    All other components within normal limits  COMPREHENSIVE METABOLIC PANEL - Abnormal; Notable for the following components:   Glucose, Bld 104 (*)    All other components within normal limits  LIPASE, BLOOD  URINALYSIS, COMPLETE (UACMP) WITH MICROSCOPIC  ____________________________________________   DIFFERENTIAL DIAGNOSIS   Biliary colic, cholecystitis, renal colic, gas pain, chronic pain  FINAL ASSESSMENT AND PLAN  Biliary colic   Plan: The patient had presented for right upper quadrant pain. Patient's labs did not reveal any acute process. Patient's imaging was reviewed from her recent visit 4 days ago which revealed cholelithiasis without signs of acute cholecystitis.  Pain has been intractable here despite morphine and Dilaudid.  I will discuss with general surgery for possible cholecystectomy for symptomatic biliary colic   Ulice Dash, MD    Note: This note was generated in part or whole with voice recognition software. Voice recognition is usually quite accurate but there are transcription errors that can and very often do  occur. I apologize for any typographical errors that were not detected and corrected.     Emily Filbert, MD 07/01/18 [phone]

## 2018-07-01 NOTE — Anesthesia Post-op Follow-up Note (Signed)
Anesthesia QCDR form completed.        

## 2018-07-01 NOTE — Anesthesia Procedure Notes (Addendum)
Procedure Name: Intubation Date/Time: 07/01/2018 5:22 PM Performed by: Irving Burton, CRNA Pre-anesthesia Checklist: Patient identified, Emergency Drugs available and Suction available Patient Re-evaluated:Patient Re-evaluated prior to induction Oxygen Delivery Method: Circle system utilized Preoxygenation: Pre-oxygenation with 100% oxygen Induction Type: IV induction and Rapid sequence Ventilation: Mask ventilation without difficulty Laryngoscope Size: McGraph and 3 Grade View: Grade I Tube type: Oral Tube size: 7.0 mm Number of attempts: 1 Airway Equipment and Method: Video-laryngoscopy and Bougie stylet Placement Confirmation: ETT inserted through vocal cords under direct vision,  positive ETCO2 and breath sounds checked- equal and bilateral Secured at: 21 cm Tube secured with: Tape Dental Injury: Teeth and Oropharynx as per pre-operative assessment  Difficulty Due To: Difficulty was anticipated and Difficult Airway- due to anterior larynx

## 2018-07-01 NOTE — Transfer of Care (Signed)
Immediate Anesthesia Transfer of Care Note  Patient: Stefanie Galvan  Procedure(s) Performed: LAPAROSCOPIC CHOLECYSTECTOMY (N/A )  Patient Location: PACU  Anesthesia Type:General  Level of Consciousness: awake  Airway & Oxygen Therapy: Patient connected to face mask oxygen  Post-op Assessment: Post -op Vital signs reviewed and stable  Post vital signs: stable  Last Vitals:  Vitals Value Taken Time  BP 143/87 07/01/2018  7:07 PM  Temp    Pulse 89 07/01/2018  7:07 PM  Resp 18 07/01/2018  7:07 PM  SpO2 97 % 07/01/2018  7:07 PM  Vitals shown include unvalidated device data.  Last Pain:  Vitals:   07/01/18 1053  TempSrc: Oral  PainSc: 4          Complications: No apparent anesthesia complications

## 2018-07-01 NOTE — ED Notes (Signed)
Screening complete with OR Nurse cindy

## 2018-07-01 NOTE — ED Triage Notes (Signed)
Pt from home with reports of RT upper abd pain that began Friday, pt seen pcp and was told possible gallbladder- US done on Friday. Pt has not been given results yet. Pt reports pain worsened this am and pt began vomiting. Denies fever.

## 2018-07-01 NOTE — H&P (Signed)
Subjective:   CC: RUQ pain, n/v  HPI:  Stefanie Galvan is a 53 y.o. female who is consulted by [facility] for evaluation of above cc.  Symptoms were first noted a few years ago. Pain is sharp, intermittent, localized to RUQ with occasional radiation around to the back.  Associated with N/V bilious with some stool discoloration, exacerbated by nothing specific.  Recommended GB removal several years ago when symptoms started but did not pursue surgery at that time.  Today, pain and N/V has increased in severity so came into ED for further eval.  Has been on CLD for past couple days but still not better.  Workup as no outpt include [place] noted below.     Past Medical History:  has a past medical history of Anxiety, Arthritis, Depression (2.5 yrs ago), and TBI (traumatic brain injury) (HCC) (11/10/2005).  Past Surgical History:  has a past surgical history that includes Temporomandibular joint surgery (1984; 1996); Tubal ligation (2003); Dilation and curettage of uterus (2001); and Breast reduction surgery (02/25/2012).  Family History: family history includes Anxiety disorder in her brother; Depression in her father and maternal grandmother.  Social History:  reports that she has never smoked. She has never used smokeless tobacco. She reports current alcohol use. She reports that she does not use drugs.  Current Medications:  escitalopram (LEXAPRO) 10 MG tablet Take 1 tablet (10 mg total) by mouth at bedtime. For depression Thierry Dobosz, [place], DO Reordered  Ordered as: escitalopram (LEXAPRO) tablet 10 mg - 10 mg, Oral, Daily at bedtime, First dose on Tue 07/01/18 at 2200  hydrOXYzine (ATARAX/VISTARIL) 25 MG tablet Take 1-2 capsule as needed for anxiety Stacey Maura, DO Reordered  Ordered as: hydrOXYzine (ATARAX/VISTARIL) tablet 25 mg - 25 mg, Oral, Daily PRN, anxiety, Starting Tue 07/01/18 at 0913  lamoTRIgine (LAMICTAL) 150 MG tablet Take 1 tablet (150 mg total) by mouth daily. Tonna Boehringer, Milika Ventress, DO Reordered   Ordered as: lamoTRIgine (LAMICTAL) tablet 150 mg - 150 mg, Oral, Daily, First dose on Tue 07/01/18 at 1000  loratadine (CLARITIN) 10 MG tablet Take 1 tablet (10 mg total) by mouth daily. (May purchase from over the counter): For allergies Sung Amabile, DO Reordered  Ordered as: loratadine (CLARITIN) tablet 10 mg - 10 mg, Oral, Daily, First dose on Tue 07/01/18 at 1000     Allergies:  Allergies as of 07/01/2018 - Review Complete 07/01/2018  Allergen Reaction Noted  . Anaprox [naproxen sodium] Shortness Of Breath 02/20/2012    ROS:  General: Denies weight loss, weight gain, fatigue, fevers, chills, and night sweats. Eyes: Denies blurry vision, double vision, eye pain, itchy eyes, and tearing. Ears: Denies hearing loss, earache, and ringing in ears. Nose: Denies sinus pain, congestion, infections, runny nose, and nosebleeds. Mouth/throat: Denies hoarseness, sore throat, bleeding gums, and difficulty swallowing. Heart: Denies chest pain, palpitations, racing heart, irregular heartbeat, leg pain or swelling, and decreased activity tolerance. Respiratory: Denies breathing difficulty, shortness of breath, wheezing, cough, and sputum. GI: Denies constipation, diarrhea, and blood in stool. GU: Denies difficulty urinating, pain with urinating, urgency, frequency, blood in urine. Musculoskeletal: Denies joint stiffness, pain, swelling, muscle weakness, and pain. Skin: Denies rash, itching, mass, tumors, sores, and boils Neurologic: Denies headache, fainting, dizziness, seizures, numbness, and tingling. Psychiatric: Denies  difficulty sleeping, and memory loss. Endocrine: Denies heat or cold intolerance, and increased thirst or urination. Blood/lymph: Denies easy bruising, easy bruising, and swollen glands    Objective:     BP 130/79   Pulse 67  Temp 98.4 F (36.9 C) (Oral)   Resp 20   Ht 5\' 8"  (1.727 m)   Wt 98.4 kg   SpO2 95%   BMI 32.99 kg/m    Constitutional :  alert,  cooperative, appears stated age and mild distress  Lymphatics/Throat:  no asymmetry, masses, or scars  Respiratory:  clear to auscultation bilaterally  Cardiovascular:  regular rate and rhythm  Gastrointestinal: soft, no guarding but focal tenderness in RUQ.   Musculoskeletal: Steady gait and movement  Skin: Cool and moist  Psychiatric: Normal affect, non-agitated, not confused       LABS:  CMP Latest Ref Rng & Units 07/01/2018 09/22/2017 03/19/2017  Glucose 70 - 99 mg/dL 098(J104(H) 191(Y104(H) 782(N126(H)  BUN 6 - 20 mg/dL 13 6 13   Creatinine 0.44 - 1.00 mg/dL 5.620.68 1.300.80 8.65(H1.03(H)  Sodium 135 - 145 mmol/L 139 142 138  Potassium 3.5 - 5.1 mmol/L 3.6 3.8 3.9  Chloride 98 - 111 mmol/L 106 109 104  CO2 22 - 32 mmol/L 25 24 26   Calcium 8.9 - 10.3 mg/dL 9.1 9.0 9.4  Total Protein 6.5 - 8.1 g/dL 7.7 7.1 8.1  Total Bilirubin 0.3 - 1.2 mg/dL 0.6 0.9 0.6  Alkaline Phos 38 - 126 U/L 67 62 72  AST 15 - 41 U/L 19 26 19   ALT 0 - 44 U/L 22 23 19    CBC Latest Ref Rng & Units 07/01/2018 09/22/2017 03/19/2017  WBC 4.0 - 10.5 K/uL 5.2 6.7 6.4  Hemoglobin 12.0 - 15.0 g/dL 84.614.4 96.212.4 95.212.8  Hematocrit 36.0 - 46.0 % 42.6 36.8 36.9  Platelets 150 - 400 K/uL 273 239 237     RADS: 1. Cholelithiasis without evidence of acutely cholecystitis. 2. Mildly increased echogenicity liver, suspicious for hepatic steatosis.  Result Narrative  US ABDOMEN LIMITED, 06/27/2018 10:38 AM  INDICATION:RUQ PAIN, CHOLECYSTITIS SUSPECTED \ R10.11 RUQ abdominal pain  ADDITIONAL HISTORY: None. COMPARISON: None.  TECHNIQUE: Multi-planar real-time ultrasonography of the upper abdomen (right upper quadrant) using grayscale imaging, supplemented by color, power, and spectral Doppler as needed.  FINDINGS:  . Vascular: The inferior vena cava is patent. The visualized aorta is unremarkable. . Pancreas: Incompletely imaged. Visualized portions are unremarkable. . Liver: The liver measures 17.2 cm in its craniocaudal dimension. No stones or  contour. Mildly increased echogenicity. No focal lesions. The portal and hepatic venous systems are patent with antegrade flow. . Gallbladder: Cholelithiasis. No gallbladder wall thickening or pericholecystic fluid. Negative sonographic Murphy's sign. . Biliary: The maximum caliber of the visualized common bile duct is 2 mm. No intrahepatic or extrahepatic ductal dilatation.  . Right kidney: The right kidney measures 11.8 cm in length. Normal size, contour, and echogenicity. No hydronephrosis or perinephric fluid. No focal mass is identified.  . Peritoneum: No ascites. . Additional comments: None.  Other Result Information  Interface, Rad Results In - 06/27/2018 11:10 AM EST US ABDOMEN LIMITED, 06/27/2018 10:38 AM  INDICATION:RUQ PAIN, CHOLECYSTITIS SUSPECTED \ R10.11 RUQ abdominal pain  ADDITIONAL HISTORY: None. COMPARISON: None.  TECHNIQUE: Multi-planar real-time ultrasonography of the upper abdomen (right upper quadrant) using grayscale imaging, supplemented by color, power, and spectral Doppler as needed.  FINDINGS:  .  Vascular: The inferior vena cava is patent. The visualized aorta is unremarkable. .  Pancreas: Incompletely imaged. Visualized portions are unremarkable. .  Liver: The liver measures 17.2 cm in its craniocaudal dimension. No stones or contour. Mildly increased echogenicity. No focal lesions. The portal and hepatic venous systems are patent with antegrade flow. .Marland Kitchen  Gallbladder: Cholelithiasis. No gallbladder wall thickening or pericholecystic fluid. Negative sonographic Murphy's sign. .  Biliary: The maximum caliber of the visualized common bile duct is 2 mm. No intrahepatic or extrahepatic ductal dilatation.  .  Right kidney: The right kidney measures 11.8 cm in length. Normal size, contour, and echogenicity. No hydronephrosis or perinephric fluid. No focal mass is identified.  .  Peritoneum: No ascites. .  Additional comments: None.  CONCLUSION:  1.   Cholelithiasis without evidence of acutely cholecystitis. 2.  Mildly increased echogenicity liver, suspicious for hepatic steatosis.    Assessment:      RUQ pain, biliary colic  Plan:      Discussed the risk of surgery including post-op infxn, seroma, biloma, chronic pain, poor-delayed wound healing, retained gallstone, conversion to open procedure, post-op SBO or ileus, and need for additional procedures to address said risks.  The risks of general anesthetic including MI, CVA, sudden death or even reaction to anesthetic medications also discussed. Alternatives include continued observation.  Benefits include possible symptom relief, prevention of complications including acute cholecystitis, pancreatitis.  Typical post operative recovery of 3-5 days rest, continued pain in area and incision sites, possible loose stools up to 4-6 weeks, also discussed.  The patient understands the risks, any and all questions were answered to the patient's satisfaction.  Due to unrelating pain, and history consistent with biliary colic will proceed with lap chole.  Labs wnl, and clinical sign of jaundice so no need for repeat US this admission, since it will not change my recommendation for lap chole.  She verbalized understanding.  NPO, IV abx, IVF, pain control until OR time available

## 2018-07-01 NOTE — Anesthesia Postprocedure Evaluation (Signed)
Anesthesia Post Note  Patient: Stefanie Galvan  Procedure(s) Performed: LAPAROSCOPIC CHOLECYSTECTOMY (N/A )  Patient location during evaluation: PACU Anesthesia Type: General Level of consciousness: awake and alert Pain management: pain level controlled Vital Signs Assessment: post-procedure vital signs reviewed and stable Respiratory status: spontaneous breathing, nonlabored ventilation and respiratory function stable Cardiovascular status: blood pressure returned to baseline and stable Postop Assessment: no apparent nausea or vomiting Anesthetic complications: no     Last Vitals:  Vitals:   07/01/18 2005 07/01/18 2025  BP: (!) 102/56 127/82  Pulse: 73 73  Resp: 17 20  Temp:  36.6 C  SpO2: 92% 93%    Last Pain:  Vitals:   07/01/18 2025  TempSrc: Oral  PainSc:                  [name]

## 2018-07-02 ENCOUNTER — Encounter: Payer: Self-pay | Admitting: Surgery

## 2018-07-02 LAB — BASIC METABOLIC PANEL
Anion gap: 7 (ref 5–15)
BUN: 11 mg/dL (ref 6–20)
CO2: 24 mmol/L (ref 22–32)
Calcium: 8.5 mg/dL — ABNORMAL LOW (ref 8.9–10.3)
Chloride: 107 mmol/L (ref 98–111)
Creatinine, Ser: 0.65 mg/dL (ref 0.44–1.00)
GFR calc Af Amer: >60 mL/min (ref >60)
GFR calc non Af Amer: >60 mL/min (ref >60)
Glucose, Bld: 179 mg/dL — ABNORMAL HIGH (ref 70–99)
POTASSIUM: 3.7 mmol/L (ref 3.5–5.1)
Sodium: 138 mmol/L (ref 135–145)

## 2018-07-02 LAB — CBC
HCT: 38.4 % (ref 36.0–46.0)
Hemoglobin: 12.8 g/dL (ref 12.0–15.0)
MCH: 27.8 pg (ref 26.0–34.0)
MCHC: 33.3 g/dL (ref 30.0–36.0)
MCV: 83.3 fL (ref 80.0–100.0)
NRBC: 0 % (ref 0.0–0.2)
Platelets: 240 10*3/uL (ref 150–400)
RBC: 4.61 MIL/uL (ref 3.87–5.11)
RDW: 12.7 % (ref 11.5–15.5)
WBC: 8.1 10*3/uL (ref 4.0–10.5)

## 2018-07-02 LAB — PHOSPHORUS: Phosphorus: 2.3 mg/dL — ABNORMAL LOW (ref 2.5–4.6)

## 2018-07-02 LAB — MAGNESIUM: Magnesium: 2 mg/dL (ref 1.7–2.4)

## 2018-07-02 MED ORDER — HYDROCODONE-ACETAMINOPHEN 5-325 MG PO TABS: 1.0000 | ORAL_TABLET | Freq: Four times a day (QID) | ORAL | 0 refills | Status: AC | PRN

## 2018-07-02 MED ORDER — DOCUSATE SODIUM 100 MG PO CAPS: 100.0000 mg | ORAL_CAPSULE | Freq: Two times a day (BID) | ORAL | 0 refills | Status: DC | PRN

## 2018-07-02 MED ORDER — IBUPROFEN 800 MG PO TABS: 800.0000 mg | ORAL_TABLET | Freq: Three times a day (TID) | ORAL | 0 refills | Status: DC | PRN

## 2018-07-02 MED ORDER — ACETAMINOPHEN 500 MG PO TABS: 1000.0000 mg | ORAL_TABLET | Freq: Three times a day (TID) | ORAL | 0 refills | Status: DC | PRN

## 2018-07-02 NOTE — Progress Notes (Signed)
Pt's son here to transport pt home, will discharge. Taken to visitor entrance via wheelchair by hospital volunteer to go home.

## 2018-07-02 NOTE — Op Note (Addendum)
Preoperative diagnosis:  Biliary colic  Postoperative diagnosis: acute cholecystitis  Procedure: Laparoscopic Cholecystectomy.   Anesthesia: GETA   Surgeon: Sung Amabile  Specimen: Gallbladder  Complications: None  EBL: 72mL  Wound Classification: Clean Contaminated  Indications: see HPI  Findings: Critical view of safety noted Cystic duct and artery identified, ligated and divided, clips remained intact at end of procedure Adequate hemostasis  Description of procedure: The patient was placed on the operating table in the supine position. SCDs placed, pre-op abx administered.  General anesthesia was induced and OG tube placed by anesthesia. A time-out was completed verifying correct patient, procedure, site, positioning, and implant(s) and/or special equipment prior to beginning this procedure. The abdomen was prepped and draped in the usual sterile fashion.  An incision was made in a natural skin line inferior to the umbilicus.  Dissection carried down to fascia where two 0 vicryl sutures placed to use as anchor sutures for hasson port.  Incision made into fascia and blunt dissection used to enter peritoneum.  Hasson port placed and insufflation started up to 73mm Hg without any dramatic increase in pressure.    The laparoscope was inserted and the abdomen inspected. No injuries from initial trocar placement were noted. Additional trocars were then inserted under direct visualization in the following locations: a 5-mm trocar in the subxyphoid region and two 5-mm trocars along the right costal margin. The abdomen was inspected and no abnormalities or injuries were found. The table was placed in the reverse Trendelenburg position with the right side up.  Filmy adhesions between the gallbladder and omentum, duodenum and transverse colon were lysed sharply. GB noted to be distended to decompressed with needle prior to the dome of the gallbladder grasped with an atraumatic grasper passed  through the lateral port and retracted over the dome of the liver. The infundibulum was also grasped with an atraumatic grasper and retracted toward the right lower quadrant. This maneuver exposed Calot's triangle. The peritoneum overlying the gallbladder infundibulum was then dissected and the cystic duct and cystic artery identified.  Critical view of safety with the liver bed clearly visible behind the duct and artery with no additional structures noted.  Picture taken before the cystic duct and cystic artery clipped and divided close to the gallbladder.  The gallbladder was then dissected from its peritoneal and liver bed attachments by electrocautery. Hemostasis was checked and the gallbladder was removed using an endoscopic retrieval bag placed through the umbilical port. The gallbladder was passed off the table as a specimen. The gallbladder fossa was copiously irrigated with saline and any leaked bile was suctioned out, and hemostasis was obtained. There was some blood noted in dependent portions so surgicell placed in the fossa.  no evidence of rebleeding from the gallbladder fossa or cystic artery or leakage of the bile from the cystic duct stump aftwards.  Umbilical port site closed with PMI using 0 vicryl under direct vision prior to abdomen desufflated and secondary trocars were removed under direct vision. No bleeding was noted. The laparoscope was withdrawn.  3-0 vicryl used to close deep dermal layer at umbilical site.  All skin incisions then closed with subcuticular sutures of 4-0 monocryl and dressed with topical skin adhesive. The orogastric tube was removed and patient extubated. The patient tolerated the procedure well and was taken to the postanesthesia care unit in stable condition.  All sponge and instrument count correct at end of procedure.

## 2018-07-02 NOTE — Progress Notes (Signed)
Provided and reviewed discharge paperwork and prescriptions. Verified understanding by use of teach back method, pt verbalized understanding as well. Follow up appointment provided. Letter for work provided. VS stable. Pt to be discharged home, to be transported by her son once he is here. Will continue to monitor.

## 2018-07-02 NOTE — Discharge Summary (Signed)
Physician Discharge Summary  Patient ID: Stefanie Galvan MRN: [id number] DOB/AGE: 08/04/1965 53 y.o.  Admit date: 07/01/2018 Discharge date: 07/02/2018  Admission Diagnoses: biliary colic  Discharge Diagnoses:  Acute cholecystitis  Discharged Condition: good  Hospital Course: admitted for biliary colic, underwent lap chole.  See op note for details.  Recovered without any issues.  Ok to d/c  Consults: None  Discharge Exam: Blood pressure 120/80, pulse 90, temperature 98.3 F (36.8 C), temperature source Oral, resp. rate 16, height 5\' 8"  (1.727 m), weight 98.4 kg, SpO2 95 %. General appearance: alert, cooperative and no distress GI: soft, non-tender; bowel sounds normal; no masses,  no organomegaly Incision/Wound:c//d/i  Disposition:     Allergies as of 07/02/2018      Reactions   Anaprox [naproxen Sodium] Shortness Of Breath   Heart palpitations, shortness of breath, generalized swelling      Medication List    TAKE these medications   acetaminophen 500 MG tablet Commonly known as:  TYLENOL Take 2 tablets (1,000 mg total) by mouth every 8 (eight) hours as needed for mild pain.   docusate sodium 100 MG capsule Commonly known as:  COLACE Take 1 capsule (100 mg total) by mouth 2 (two) times daily as needed for mild constipation.   escitalopram 10 MG tablet Commonly known as:  LEXAPRO Take 1 tablet (10 mg total) by mouth at bedtime. For depression   HYDROcodone-acetaminophen 5-325 MG tablet Commonly known as:  NORCO Take 1 tablet by mouth every 6 (six) hours as needed for up to 3 days for moderate pain.   hydrOXYzine 25 MG tablet Commonly known as:  ATARAX/VISTARIL Take 1-2 capsule as needed for anxiety   ibuprofen 800 MG tablet Commonly known as:  ADVIL,MOTRIN Take 1 tablet (800 mg total) by mouth every 8 (eight) hours as needed for mild pain or moderate pain.   lamoTRIgine 150 MG tablet Commonly known as:  LAMICTAL Take 1 tablet (150 mg total) by mouth  daily.   loratadine 10 MG tablet Commonly known as:  CLARITIN Take 1 tablet (10 mg total) by mouth daily. (May purchase from over the counter): For allergies      Follow-up Information    Tonna Boehringer, Jacqueline Spofford, DO Follow up in 2 week(s).   Specialty:  Surgery Contact information: [address] [place] [address] [phone]            Total time spent arranging discharge was >11min. Signed: Sung Amabile 07/02/2018, 7:45 AM

## 2018-07-03 LAB — SURGICAL PATHOLOGY

## 2018-08-11 ENCOUNTER — Other Ambulatory Visit (HOSPITAL_COMMUNITY): Payer: Self-pay

## 2018-08-11 DIAGNOSIS — F319 Bipolar disorder, unspecified: Secondary | ICD-10-CM

## 2018-08-11 DIAGNOSIS — F4310 Post-traumatic stress disorder, unspecified: Secondary | ICD-10-CM

## 2018-08-11 MED ORDER — ESCITALOPRAM OXALATE 10 MG PO TABS: 10.0000 mg | ORAL_TABLET | Freq: Every day | ORAL | 0 refills | Status: DC

## 2018-08-11 MED ORDER — HYDROXYZINE HCL 25 MG PO TABS: ORAL_TABLET | ORAL | 0 refills | Status: DC

## 2018-08-11 MED ORDER — LAMOTRIGINE 150 MG PO TABS: 150.0000 mg | ORAL_TABLET | Freq: Every day | ORAL | 0 refills | Status: DC

## 2018-08-26 ENCOUNTER — Emergency Department (HOSPITAL_BASED_OUTPATIENT_CLINIC_OR_DEPARTMENT_OTHER): Payer: PRIVATE HEALTH INSURANCE

## 2018-08-26 ENCOUNTER — Other Ambulatory Visit: Payer: Self-pay

## 2018-08-26 ENCOUNTER — Emergency Department (HOSPITAL_BASED_OUTPATIENT_CLINIC_OR_DEPARTMENT_OTHER)
Admission: EM | Admit: 2018-08-26 | Discharge: 2018-08-26 | Disposition: A | Discharge status: Home / Self Care | Payer: PRIVATE HEALTH INSURANCE | Attending: Emergency Medicine | Admitting: Emergency Medicine

## 2018-08-26 DIAGNOSIS — R21 Rash and other nonspecific skin eruption: Secondary | ICD-10-CM | POA: Insufficient documentation

## 2018-08-26 DIAGNOSIS — R0789 Other chest pain: Secondary | ICD-10-CM | POA: Insufficient documentation

## 2018-08-26 DIAGNOSIS — B354 Tinea corporis: Secondary | ICD-10-CM | POA: Insufficient documentation

## 2018-08-26 LAB — CBC
HCT: 41.5 % (ref 36.0–46.0)
Hemoglobin: 13.6 g/dL (ref 12.0–15.0)
MCH: 27.6 pg (ref 26.0–34.0)
MCHC: 32.8 g/dL (ref 30.0–36.0)
MCV: 84.2 fL (ref 80.0–100.0)
Platelets: 299 10*3/uL (ref 150–400)
RBC: 4.93 MIL/uL (ref 3.87–5.11)
RDW: 13 % (ref 11.5–15.5)
WBC: 6.5 10*3/uL (ref 4.0–10.5)
nRBC: 0 % (ref 0.0–0.2)

## 2018-08-26 LAB — BASIC METABOLIC PANEL
Anion gap: 7 (ref 5–15)
BUN: 12 mg/dL (ref 6–20)
CO2: 24 mmol/L (ref 22–32)
Calcium: 9 mg/dL (ref 8.9–10.3)
Chloride: 104 mmol/L (ref 98–111)
Creatinine, Ser: 0.69 mg/dL (ref 0.44–1.00)
GFR calc Af Amer: >60 mL/min (ref >60)
GFR calc non Af Amer: >60 mL/min (ref >60)
Glucose, Bld: 91 mg/dL (ref 70–99)
Potassium: 3.7 mmol/L (ref 3.5–5.1)
Sodium: 135 mmol/L (ref 135–145)

## 2018-08-26 LAB — TROPONIN I
Troponin I: <0.03 ng/mL (ref <0.03)
Troponin I: <0.03 ng/mL (ref <0.03)

## 2018-08-26 MED ORDER — BUTENAFINE HCL 1 % EX CREA: 1.0000 [in_us] | TOPICAL_CREAM | Freq: Two times a day (BID) | CUTANEOUS | 0 refills | Status: DC

## 2018-08-26 NOTE — ED Provider Notes (Signed)
[facility] EMERGENCY DEPARTMENT Provider Note   CSN: [id number] Arrival date & time: 08/26/18  1348    History   Chief Complaint Chief Complaint  Patient presents with  . Chest Pain    HPI Stefanie Galvan is a 53 y.o. female.     Pt presents to the ED today with CP.  She said it woke her up from sleep around 0500.  She took 3 asa and was able to fall back asleep about 40 min later.  She went to work and the pain came back around 12.  She said it is gone now.  Nothing makes it worse or better.  She has never had anything like this in the past.  BP is also high which is unusual for her.  No sob.  She also has a rash on her left leg that has been there for a year.  She has tried several different otc meds.     Past Medical History:  Diagnosis Date  . Anxiety   . Arthritis   . Depression 2.5 yrs ago   situational dep-no current tx/meds  . TBI (traumatic brain injury) (HCC) 11/10/2005    Patient Active Problem List   Diagnosis Date Noted  . Biliary colic 07/01/2018  . Bipolar affective (HCC) 09/24/2017  . Lithium overdose 09/23/2017  . Adjustment disorder with mixed disturbance of emotions and conduct   . Lithium overdose, intentional self-harm, initial encounter (HCC) 09/22/2017  . QT prolongation 09/22/2017  . Bipolar 1 disorder, depressed (HCC) 03/19/2017  . Bipolar 2 disorder, major depressive episode (HCC) 03/04/2017    Class: Chronic  . Chronic post-traumatic stress disorder (PTSD) 03/04/2017    Class: Chronic  . DDD (degenerative disc disease), lumbar 03/04/2017  . Arthritis 08/29/2016    Past Surgical History:  Procedure Laterality Date  . BREAST REDUCTION SURGERY  02/25/2012   Procedure: MAMMARY REDUCTION  (BREAST);  Surgeon: Louisa SecondGerald Truesdale, MD;  Location: [facility];  Service: Plastics;  Laterality: Bilateral;  bilateral reduction  . CHOLECYSTECTOMY N/A 07/01/2018   Procedure: LAPAROSCOPIC CHOLECYSTECTOMY;  Surgeon: Sung AmabileSakai,  Isami, DO;  Location: [facility];  Service: General;  Laterality: N/A;  . DILATION AND CURETTAGE OF UTERUS  2001  . TEMPOROMANDIBULAR JOINT SURGERY  1984; 1996   X 2; bilateral  . TUBAL LIGATION  2003     OB History   No obstetric history on file.      Home Medications    Prior to Admission medications   Medication Sig Start Date End Date Taking? Authorizing Provider  acetaminophen (TYLENOL) 500 MG tablet Take 2 tablets (1,000 mg total) by mouth every 8 (eight) hours as needed for mild pain. 07/02/18   Tonna BoehringerSakai, Isami, DO  Butenafine HCl 1 % cream Apply 1 inch topically 2 (two) times daily. 08/26/18   Jacalyn LefevreHaviland, Devon Pretty, MD  docusate sodium (COLACE) 100 MG capsule Take 1 capsule (100 mg total) by mouth 2 (two) times daily as needed for mild constipation. 07/02/18   Tonna BoehringerSakai, Isami, DO  escitalopram (LEXAPRO) 10 MG tablet Take 1 tablet (10 mg total) by mouth at bedtime. For depression 08/11/18   Arfeen, Phillips GroutSyed T, MD  hydrOXYzine (ATARAX/VISTARIL) 25 MG tablet Take 1-2 capsule as needed for anxiety 08/11/18   Arfeen, Phillips GroutSyed T, MD  ibuprofen (ADVIL,MOTRIN) 800 MG tablet Take 1 tablet (800 mg total) by mouth every 8 (eight) hours as needed for mild pain or moderate pain. 07/02/18   Sung AmabileSakai, Isami, DO  lamoTRIgine (LAMICTAL) 150 MG  tablet Take 1 tablet (150 mg total) by mouth daily. 08/11/18   Arfeen, Phillips Grout, MD  loratadine (CLARITIN) 10 MG tablet Take 1 tablet (10 mg total) by mouth daily. (May purchase from over the counter): For allergies 09/29/17   Sanjuana Kava, NP    Family History Family History  Problem Relation Age of Onset  . Depression Father   . Anxiety disorder Brother   . Depression Maternal Grandmother     Social History Social History   Tobacco Use  . Smoking status: Never Smoker  . Smokeless tobacco: Never Used  Substance Use Topics  . Alcohol use: Yes    Comment: Occasional drink   . Drug use: No     Allergies   Anaprox [naproxen sodium]   Review of Systems Review of Systems   Cardiovascular: Positive for chest pain.  All other systems reviewed and are negative.    Physical Exam Updated Vital Signs BP 124/84   Pulse 61   Temp 98.7 F (37.1 C) (Oral)   Resp 13   Ht 5\' 8"  (1.727 m)   Wt 102.1 kg   SpO2 96%   BMI 34.21 kg/m   Physical Exam Vitals signs and nursing note reviewed.  Constitutional:      Appearance: She is well-developed.  HENT:     Head: Normocephalic and atraumatic.  Eyes:     Extraocular Movements: Extraocular movements intact.     Pupils: Pupils are equal, round, and reactive to light.  Neck:     Musculoskeletal: Normal range of motion and neck supple.  Cardiovascular:     Rate and Rhythm: Normal rate and regular rhythm.     Heart sounds: Normal heart sounds.  Pulmonary:     Effort: Pulmonary effort is normal.     Breath sounds: Normal breath sounds.  Abdominal:     General: Bowel sounds are normal.     Palpations: Abdomen is soft.  Musculoskeletal: Normal range of motion.  Skin:    General: Skin is warm and dry.     Capillary Refill: Capillary refill takes less than 2 seconds.     Comments: Fungal like rash left inner thigh  Neurological:     General: No focal deficit present.     Mental Status: She is alert and oriented to person, place, and time.  Psychiatric:        Mood and Affect: Mood normal.        Behavior: Behavior normal.      ED Treatments / Results  Labs (all labs ordered are listed, but only abnormal results are displayed) Labs Reviewed  BASIC METABOLIC PANEL  CBC  TROPONIN I  TROPONIN I    EKG EKG Interpretation  Date/Time:  Tuesday August 26 2018 13:56:04 EDT Ventricular Rate:  73 PR Interval:  168 QRS Duration: 82 QT Interval:  376 QTC Calculation: 414 R Axis:   30 Text Interpretation:  Normal sinus rhythm Cannot rule out Anterior infarct , age undetermined Abnormal ECG similar to prior 4/19 Confirmed by Meridee Score [phone]) on 08/26/2018 2:08:20 PM   Radiology Dg Chest 2  View  Result Date: 08/26/2018 CLINICAL DATA:  Chest pain. EXAM: CHEST - 2 VIEW COMPARISON:  Chest x-ray dated January 03, 2015. FINDINGS: The heart size and mediastinal contours are within normal limits. Both lungs are clear. The visualized skeletal structures are unremarkable. IMPRESSION: No active cardiopulmonary disease. Electronically Signed   By: Obie Dredge M.D.   On: 08/26/2018 14:30  Procedures Procedures (including critical care time)  Medications Ordered in ED Medications - No data to display   Initial Impression / Assessment and Plan / ED Course  I have reviewed the triage vital signs and the nursing notes.  Pertinent labs & imaging results that were available during my care of the patient were reviewed by me and considered in my medical decision making (see chart for details).       Pt has had 2 negative troponins with a normal EKG and pain since 0500.  She does have a family hx, so is told to f/u with cards.  Her bp is back to nl.  The rash is likely fungal and will be treated with  Butenafine.  She is instructed to return if worse.  Final Clinical Impressions(s) / ED Diagnoses   Final diagnoses:  Atypical chest pain  Tinea corporis    ED Discharge Orders         Ordered    Butenafine HCl 1 % cream  2 times daily     08/26/18 1746           Jacalyn Lefevre, MD 08/26/18 [phone]

## 2018-08-26 NOTE — ED Triage Notes (Signed)
Beginning 5am, awoke from sleep with chest pain radiating into right arm, neck.  Took baby aspirin x3 with improvement,  Went back to sleep. Went to work, pain returned, as before, but with added  Symptom of tingling in right side of face.

## 2018-09-11 ENCOUNTER — Telehealth (HOSPITAL_COMMUNITY): Payer: Self-pay

## 2018-09-11 ENCOUNTER — Other Ambulatory Visit (HOSPITAL_COMMUNITY): Payer: Self-pay | Admitting: Psychiatry

## 2018-09-11 DIAGNOSIS — F4310 Post-traumatic stress disorder, unspecified: Secondary | ICD-10-CM

## 2018-09-11 DIAGNOSIS — F319 Bipolar disorder, unspecified: Secondary | ICD-10-CM

## 2018-09-11 MED ORDER — HYDROXYZINE HCL 25 MG PO TABS: ORAL_TABLET | ORAL | 0 refills | Status: DC

## 2018-09-11 MED ORDER — LAMOTRIGINE 150 MG PO TABS: 150.0000 mg | ORAL_TABLET | Freq: Every day | ORAL | 0 refills | Status: DC

## 2018-09-11 MED ORDER — ESCITALOPRAM OXALATE 10 MG PO TABS: 10.0000 mg | ORAL_TABLET | Freq: Every day | ORAL | 0 refills | Status: DC

## 2018-09-11 NOTE — Progress Notes (Signed)
Medication sent to the pharmacy at [facility], [employer] for 30 days.

## 2018-09-11 NOTE — Telephone Encounter (Signed)
Patient has an appointment with you tomorrow however she took her last pills this morning and wants to know if you can send her 90 day prescriptions.

## 2018-09-12 ENCOUNTER — Other Ambulatory Visit: Payer: Self-pay

## 2018-09-12 ENCOUNTER — Telehealth (INDEPENDENT_AMBULATORY_CARE_PROVIDER_SITE_OTHER): Payer: PRIVATE HEALTH INSURANCE | Admitting: Psychiatry

## 2018-09-12 DIAGNOSIS — F4310 Post-traumatic stress disorder, unspecified: Secondary | ICD-10-CM

## 2018-09-12 DIAGNOSIS — F319 Bipolar disorder, unspecified: Secondary | ICD-10-CM | POA: Diagnosis not present

## 2018-09-12 DIAGNOSIS — Z915 Personal history of self-harm: Secondary | ICD-10-CM

## 2018-09-12 MED ORDER — ESCITALOPRAM OXALATE 10 MG PO TABS: 10.0000 mg | ORAL_TABLET | Freq: Every day | ORAL | 0 refills | Status: DC

## 2018-09-12 MED ORDER — HYDROXYZINE HCL 25 MG PO TABS: ORAL_TABLET | ORAL | 0 refills | Status: DC

## 2018-09-12 MED ORDER — LAMOTRIGINE 150 MG PO TABS: 150.0000 mg | ORAL_TABLET | Freq: Every day | ORAL | 0 refills | Status: DC

## 2018-09-12 NOTE — Progress Notes (Signed)
Virtual Visit via Telephone Note  I connected with Stefanie Galvan on 09/12/18 at 10:20 AM EDT by telephone and verified that I am speaking with the correct person using two identifiers.   I discussed the limitations, risks, security and privacy concerns of performing an evaluation and management service by telephone and the availability of in person appointments. I also discussed with the patient that there may be a patient responsible charge related to this service. The patient expressed understanding and agreed to proceed.   History of Present Illness: Patient was evaluated through the phone session.  She is taking her medication as prescribed.  She recently changed her job.  She is now working for a different Nature conservation officer at [employer] and she like her job because it is close and she is getting more money.  She is sleeping good.  She really has nightmares and flashback.  Her stepfather died recently and she took hydroxyzine to help her anxiety.  Overall she describes her mood is good.  She is taking Lamictal and Lexapro.  She has no rash, itching or tremors.  Recently 1 of her employee exposed to pandemic coronavirus and now she is working in a small room with no other people's contact.  She also is wearing mask and gloves and keeping socially distancing.  She worried about the future but she realized that nothing she can do.  She has a good relationship with her mother who lives with her.  She denies drinking or using any illegal substances.  She admitted not able to do exercise since dreams are closed but she is trying her best to do exercise at home.  She does not want to change her medication.  Past Psychiatric History: Reviewed H/O bipolar disorder and PTSD. H/O multiple hospitalization. Last hospitalization April 2019 after taking overdose on lithium, trazodone and Lexapro. H/O prior overdose on Minipress. H/O poor impulse controlwith excessive talking, buying, shopping, rage and mood  lability. H/O inpatient at [facility], [facility], [facility] and behavioral center. Took Seroquel, Zoloft, Cymbalta, Paxil, Prozac, Ativan, Abilify, Wellbutrin, Minipress and lithium.    Mental Status Examination;  Limited mental status examination done because of phone conversation.  Patient describes her mood is good and her speech is clear, coherent with normal tone and volume.  She does not appear to be distracted on the phone.  She denies any auditory or visual hallucination.  She denies any paranoia or any suicidal thoughts.  She do not report any tremors shakes or any EPS.  She denies any delusions, paranoia or any aggressive behavior.  She reported her mood is a stable I do not recall any mania.  Her fund of knowledge is adequate.  She is alert and oriented x3.  Her insight judgment is good.  Assessment and Plan: Bipolar disorder, depressed type.  Posttraumatic stress disorder.  Patient is a stable on her current medication.  She rarely takes hydroxyzine.  She has no rash or itching.  I will continue Lamictal 150 mg daily, Lexapro 10 mg daily and hydroxyzine 25 mg as needed for anxiety.  Recommended to call us back if is any question or any concern.  Encourage healthy lifestyle and do regular exercise.  I will see her again in 3 months.  Follow Up Instructions:    I discussed the assessment and treatment plan with the patient. The patient was provided an opportunity to ask questions and all were answered. The patient agreed with the plan and demonstrated an  understanding of the instructions.   The patient was advised to call back or seek an in-person evaluation if the symptoms worsen or if the condition fails to improve as anticipated.  I provided 15 minutes of non-face-to-face time during this encounter.   Kathlee Nations, MD

## 2018-11-20 ENCOUNTER — Telehealth (HOSPITAL_COMMUNITY): Payer: Self-pay

## 2018-11-20 NOTE — Telephone Encounter (Signed)
Patient called and she states that she thinks that she may need to go up on her Lamictal - she states that she is starting to feel manicy. She does not come in until the end of June. Please review and advise, thank you

## 2018-11-24 NOTE — Telephone Encounter (Signed)
Currently she is taking Lamictal 150 mg daily.  She can try 200 mg however she need to watch carefully about the rash.  I recommend to keep appointment which is coming up in few weeks.  Please call the Lamictal 200 mg daily to her pharmacy.

## 2018-11-25 ENCOUNTER — Other Ambulatory Visit (HOSPITAL_COMMUNITY): Payer: Self-pay

## 2018-11-25 DIAGNOSIS — F319 Bipolar disorder, unspecified: Secondary | ICD-10-CM

## 2018-11-25 MED ORDER — LAMOTRIGINE 200 MG PO TABS: 200.0000 mg | ORAL_TABLET | Freq: Every day | ORAL | 0 refills | Status: DC

## 2018-11-25 NOTE — Telephone Encounter (Signed)
Sent new prescription to the pharmacy, patient notified

## 2018-11-30 DIAGNOSIS — K5730 Diverticulosis of large intestine without perforation or abscess without bleeding: Secondary | ICD-10-CM | POA: Insufficient documentation

## 2018-12-15 ENCOUNTER — Other Ambulatory Visit: Payer: Self-pay

## 2018-12-15 ENCOUNTER — Encounter (HOSPITAL_COMMUNITY): Payer: Self-pay | Admitting: Psychiatry

## 2018-12-15 ENCOUNTER — Encounter (HOSPITAL_COMMUNITY): Payer: Self-pay

## 2018-12-15 ENCOUNTER — Ambulatory Visit (INDEPENDENT_AMBULATORY_CARE_PROVIDER_SITE_OTHER): Payer: BC Managed Care – PPO | Admitting: Psychiatry

## 2018-12-15 ENCOUNTER — Inpatient Hospital Stay (HOSPITAL_COMMUNITY)
Admission: RE | Admit: 2018-12-15 | Discharge: 2018-12-18 | DRG: 885 | Disposition: A | Discharge status: Home / Self Care | Payer: BC Managed Care – PPO | Attending: Psychiatry | Admitting: Psychiatry

## 2018-12-15 DIAGNOSIS — Z915 Personal history of self-harm: Secondary | ICD-10-CM

## 2018-12-15 DIAGNOSIS — R739 Hyperglycemia, unspecified: Secondary | ICD-10-CM | POA: Diagnosis present

## 2018-12-15 DIAGNOSIS — G4700 Insomnia, unspecified: Secondary | ICD-10-CM | POA: Diagnosis present

## 2018-12-15 DIAGNOSIS — F314 Bipolar disorder, current episode depressed, severe, without psychotic features: Secondary | ICD-10-CM | POA: Diagnosis not present

## 2018-12-15 DIAGNOSIS — M1990 Unspecified osteoarthritis, unspecified site: Secondary | ICD-10-CM | POA: Diagnosis present

## 2018-12-15 DIAGNOSIS — F3132 Bipolar disorder, current episode depressed, moderate: Secondary | ICD-10-CM

## 2018-12-15 DIAGNOSIS — Z79899 Other long term (current) drug therapy: Secondary | ICD-10-CM | POA: Diagnosis not present

## 2018-12-15 DIAGNOSIS — F4310 Post-traumatic stress disorder, unspecified: Secondary | ICD-10-CM | POA: Diagnosis present

## 2018-12-15 DIAGNOSIS — R45851 Suicidal ideations: Secondary | ICD-10-CM | POA: Diagnosis present

## 2018-12-15 DIAGNOSIS — Z818 Family history of other mental and behavioral disorders: Secondary | ICD-10-CM | POA: Diagnosis not present

## 2018-12-15 DIAGNOSIS — S069X9S Unspecified intracranial injury with loss of consciousness of unspecified duration, sequela: Secondary | ICD-10-CM | POA: Diagnosis not present

## 2018-12-15 DIAGNOSIS — F332 Major depressive disorder, recurrent severe without psychotic features: Secondary | ICD-10-CM | POA: Diagnosis present

## 2018-12-15 DIAGNOSIS — F411 Generalized anxiety disorder: Secondary | ICD-10-CM | POA: Diagnosis present

## 2018-12-15 DIAGNOSIS — E7800 Pure hypercholesterolemia, unspecified: Secondary | ICD-10-CM | POA: Diagnosis present

## 2018-12-15 DIAGNOSIS — F319 Bipolar disorder, unspecified: Secondary | ICD-10-CM

## 2018-12-15 DIAGNOSIS — Z888 Allergy status to other drugs, medicaments and biological substances status: Secondary | ICD-10-CM

## 2018-12-15 DIAGNOSIS — F3130 Bipolar disorder, current episode depressed, mild or moderate severity, unspecified: Principal | ICD-10-CM | POA: Diagnosis present

## 2018-12-15 LAB — COMPREHENSIVE METABOLIC PANEL
ALT: 26 U/L (ref 0–44)
AST: 22 U/L (ref 15–41)
Albumin: 4.3 g/dL (ref 3.5–5.0)
Alkaline Phosphatase: 76 U/L (ref 38–126)
Anion gap: 8 (ref 5–15)
BUN: 12 mg/dL (ref 6–20)
CO2: 25 mmol/L (ref 22–32)
Calcium: 9.3 mg/dL (ref 8.9–10.3)
Chloride: 106 mmol/L (ref 98–111)
Creatinine, Ser: 0.81 mg/dL (ref 0.44–1.00)
GFR calc Af Amer: >60 mL/min (ref >60)
GFR calc non Af Amer: >60 mL/min (ref >60)
Glucose, Bld: 105 mg/dL — ABNORMAL HIGH (ref 70–99)
Potassium: 4 mmol/L (ref 3.5–5.1)
Sodium: 139 mmol/L (ref 135–145)
Total Bilirubin: 0.5 mg/dL (ref 0.3–1.2)
Total Protein: 7.4 g/dL (ref 6.5–8.1)

## 2018-12-15 MED ORDER — ARIPIPRAZOLE 5 MG PO TABS: 5.0000 mg | ORAL_TABLET | Freq: Every day | ORAL | Status: DC

## 2018-12-15 MED ORDER — ARIPIPRAZOLE 10 MG PO TABS
10.0000 mg | ORAL_TABLET | Freq: Every day | ORAL | Status: DC
  Administered 2018-12-16 – 2018-12-18 (×3): 10 mg via ORAL
  Filled 2018-12-15 (×4): qty 1

## 2018-12-15 MED ORDER — LORAZEPAM 1 MG PO TABS
1.0000 mg | ORAL_TABLET | Freq: Three times a day (TID) | ORAL | Status: DC | PRN
  Administered 2018-12-15 – 2018-12-17 (×3): 1 mg via ORAL
  Filled 2018-12-15 (×3): qty 1

## 2018-12-15 MED ORDER — ESCITALOPRAM OXALATE 10 MG PO TABS
10.0000 mg | ORAL_TABLET | Freq: Every day | ORAL | Status: DC
  Filled 2018-12-15: qty 1

## 2018-12-15 MED ORDER — ARIPIPRAZOLE 5 MG PO TABS
5.0000 mg | ORAL_TABLET | Freq: Every day | ORAL | Status: DC
  Filled 2018-12-15: qty 1

## 2018-12-15 MED ORDER — LAMOTRIGINE 200 MG PO TABS
200.0000 mg | ORAL_TABLET | Freq: Every day | ORAL | Status: DC
  Administered 2018-12-15 – 2018-12-18 (×4): 200 mg via ORAL
  Filled 2018-12-15: qty 2
  Filled 2018-12-15 (×5): qty 1

## 2018-12-15 MED ORDER — DOCUSATE SODIUM 100 MG PO CAPS: 100.0000 mg | ORAL_CAPSULE | Freq: Two times a day (BID) | ORAL | Status: DC | PRN

## 2018-12-15 MED ORDER — ESCITALOPRAM OXALATE 5 MG PO TABS
5.0000 mg | ORAL_TABLET | Freq: Every day | ORAL | Status: DC
  Administered 2018-12-15 – 2018-12-18 (×4): 5 mg via ORAL
  Filled 2018-12-15 (×6): qty 1
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
## 2018-12-15 NOTE — Progress Notes (Signed)
Skin Assessment:  Patient has numerous tattoos:  L foot, L ankle, R ankle, L wrist, L ring finger, R forearm, R upper arm, back of neck, L shoulder blade, L lower back, L side of rib cage, dermal piercings on upper chest.  Granuloma anulare between upper legs, L upper arm.  Stitch on L upper leg (stitches to be removed Friday, granuloma anulare).  Scars from gallbladder surgery in January 2020.

## 2018-12-15 NOTE — Progress Notes (Signed)
Virtual Visit via Telephone Note  I connected with Stefanie Galvan on 12/15/18 at  9:40 AM EDT by telephone and verified that I am speaking with the correct person using two identifiers.   I discussed the limitations, risks, security and privacy concerns of performing an evaluation and management service by telephone and the availability of in person appointments. I also discussed with the patient that there may be a patient responsible charge related to this service. The patient expressed understanding and agreed to proceed.   History of Present Illness: Patient was evaluated by phone session.  She admitted increased sadness, depression and having passive and fleeting suicidal thoughts but no plan.  She even called suicidal hotline but got very upset with them after chatting for 10 minutes with no conclusion.  She endorsed increased stress at work and having a difficult relationship with her mother.  Patient told sometimes she is so rude and she remained quite and wish to go somewhere and no one knows.  She is having bad thoughts in her mind.  She admitted poor sleep, racing thoughts, feeling of hopelessness and worthlessness.  She had a history of multiple hospitalization and overdose.  Though she denies any plan but she also reply that she is not sure what she will do because she gets very impulsive when these bad thoughts comes.  Lately she had called [place] and we have increase Lamictal and she admitted it helped her manic symptoms but her depression started get worse.  She is not happy with her living situation.  She denies drinking or using any illegal substances.  She has nightmares, flashbacks, bad dreams.   Past Psychiatric History:Reviewed H/O bipolar disorder and PTSD. H/O multiple hospitalization. Last hospitalization April 2019 after taking overdose on lithium, trazodone and Lexapro. H/O prior overdose on Minipress. H/O poor impulse controlwith excessive talking, buying, shopping, rage  and mood lability. H[facility] inpatient [facility] and behavioral center. Took Seroquel, Zoloft, Cymbalta, Paxil, Prozac, Ativan, Abilify, Wellbutrin, Minipress and lithium.    Psychiatric Specialty Exam: Physical Exam  ROS  There were no vitals taken for this visit.There is no height or weight on file to calculate BMI.  General Appearance: NA  Eye Contact:  NA  Speech:  Slow  Volume:  Decreased  Mood:  Depressed, Dysphoric, Hopeless and Tearful and crying  Affect:  NA  Thought Process:  Goal Directed  Orientation:  Full (Time, Place, and Person)  Thought Content:  Rumination  Suicidal Thoughts:  Yes.  without intent/plan  Homicidal Thoughts:  No  Memory:  Immediate;   Good Recent;   Good Remote;   Good  Judgement:  Fair  Insight:  Good  Psychomotor Activity:  Decreased  Concentration:  Concentration: Fair and Attention Span: Fair  Recall:  Good  Fund of Knowledge:  Good  Language:  Good  Akathisia:  No  Handed:  Right  AIMS (if indicated):     Assets:  Communication Skills  ADL's:  Intact  Cognition:  WNL  Sleep:         Assessment and Plan: Bipolar disorder, depressed type.  Posttraumatic stress disorder.  Patient is worsening and slowly decompensating.  She is having bad thoughts and passive and fleeting suicidal ideation.  I offered voluntary inpatient and she agreed with the plan.  Lately we increase Lamictal to 200 mg which helped some of her manic symptoms but her depression continue to get worse.  She also taking Lexapro 10 mg  and hydroxyzine 25 mg as needed.  She admitted lately taking more hydroxyzine to help her anxiety.  Patient will come in for assessment for possible inpatient.  Plan discussed with Regan who will call patient for assessment.  Patient requires inpatient treatment.  She agreed to come as a voluntary.  Follow Up Instructions:    I discussed the assessment and treatment plan with the patient. The  patient was provided an opportunity to ask questions and all were answered. The patient agreed with the plan and demonstrated an understanding of the instructions.   The patient was advised to call back or seek an in-person evaluation if the symptoms worsen or if the condition fails to improve as anticipated.  I provided 25 minutes of non-face-to-face time during this encounter.   Stefanie NipperSyed T Oriya Kettering, MD

## 2018-12-15 NOTE — Tx Team (Signed)
Initial Treatment Plan 12/15/2018 4:14 PM KARISHMA UNREIN OXB:[id number]    PATIENT STRESSORS: Health problems Marital or family conflict Occupational concerns   PATIENT STRENGTHS: Ability for insight Average or above average intelligence Communication skills General fund of knowledge Motivation for treatment/growth Physical Health Work skills   PATIENT IDENTIFIED PROBLEMS: "manic/depressive episodes"  "medication not working"  "lost who I am as a person"                 DISCHARGE CRITERIA:  Ability to meet basic life and health needs Adequate post-discharge living arrangements Improved stabilization in mood, thinking, and/or behavior Reduction of life-threatening or endangering symptoms to within safe limits  PRELIMINARY DISCHARGE PLAN: Attend aftercare/continuing care group Attend PHP/IOP Outpatient therapy Participate in family therapy  PATIENT/FAMILY INVOLVEMENT: This treatment plan has been presented to and reviewed with the patient, SHERANDA SEABROOKS.  The patient and family have been given the opportunity to ask questions and make suggestions.  Baron Sane, RN 12/15/2018, 4:14 PM
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
## 2018-12-15 NOTE — [facility] Suicide Risk Assessment (Signed)
[facility] Admission Suicide Risk Assessment   Nursing information obtained from:  Patient Demographic factors:  Divorced or widowed, Caucasian Current Mental Status:  Suicidal ideation indicated by patient, Self-harm behaviors, Thoughts of violence towards others, Self-harm thoughts Loss Factors:  NA Historical Factors:  Prior suicide attempts, Family history of mental illness or substance abuse, Impulsivity Risk Reduction Factors:  Employed, Positive coping skills or problem solving skills, Living with another person, especially a relative, Sense of responsibility to family, Positive social support, Positive therapeutic relationship  Total Time spent with patient: 45 minutes Principal Problem: Bipolar Disorder, Depressed versus Mixed  Diagnosis:  Active Problems:   MDD (major depressive disorder), recurrent episode, severe (HCC)  Subjective Data:   Continued Clinical Symptoms:  Alcohol Use Disorder Identification Test Final Score (AUDIT): 0 The "Alcohol Use Disorders Identification Test", Guidelines for Use in Primary Care, Second Edition.  World Pharmacologist [facility]). Score between 0-7:  no or low risk or alcohol related problems. Score between 8-15:  moderate risk of alcohol related problems. Score between 16-19:  high risk of alcohol related problems. Score 20 or above:  warrants further diagnostic evaluation for alcohol dependence and treatment.   CLINICAL FACTORS:   53 year old female, history of Bipolar Disorder, reports worsening depression, neuro-vegetative symptoms and passive SI over recent weeks. Also endorses some symptoms suggestive of mixed episode ( vague irritability, subjective feeling of racing thoughts and pressured speech).  Has been taking prescribed medications regularly ( Lamotrigine and Lexapro)    Psychiatric Specialty Exam: Physical Exam  ROS  Blood pressure 130/75, pulse 76, temperature 98.4 F (36.9 C), temperature source Oral, resp. rate 18, height 5\' 9"   (1.753 m), weight 104.3 kg, SpO2 100 %.Body mass index is 33.97 kg/m.  See admit note MSE  COGNITIVE FEATURES THAT CONTRIBUTE TO RISK:  Closed-mindedness and Loss of executive function    SUICIDE RISK:   Moderate:  Frequent suicidal ideation with limited intensity, and duration, some specificity in terms of plans, no associated intent, good self-control, limited dysphoria/symptomatology, some risk factors present, and identifiable protective factors, including available and accessible social support.  PLAN OF CARE: Patient will be admitted to inpatient psychiatric unit for stabilization and safety. Will provide and encourage milieu participation. Provide medication management and maked adjustments as needed.  Will follow daily.    I certify that inpatient services furnished can reasonably be expected to improve the patient's condition.   Jenne Campus, MD 12/15/2018, 6:25 PM

## 2018-12-15 NOTE — H&P (Addendum)
Behavioral Health Medical Screening Exam  Stefanie Galvan is an 53 y.o. female presents as walk in at [facility] from her psychiatrist office Dr. Adele Schilder with complaints of worsening depression, anxiety, and suicidal ideation.  Patient is unable to contract for safety.  Stating that her main stressors are her mother and work. Prior history of suicide attempt x3.    Total Time spent with patient: 30 minutes  Psychiatric Specialty Exam: Physical Exam  Constitutional: She is oriented to person, place, and time. She appears well-developed and well-nourished. No distress.  Neck: Normal range of motion.  Respiratory: Effort normal.  Musculoskeletal: Normal range of motion.  Neurological: She is alert and oriented to person, place, and time.  Skin: Skin is warm and dry.    Review of Systems  Psychiatric/Behavioral: Positive for depression and suicidal ideas. The patient is nervous/anxious.   All other systems reviewed and are negative.   There were no vitals taken for this visit.There is no height or weight on file to calculate BMI.  General Appearance: Casual  Eye Contact:  Good  Speech:  Clear and Coherent and Normal Rate  Volume:  Normal  Mood:  Anxious, Depressed and Irritable  Affect:  Congruent, Depressed and Tearful  Thought Process:  Coherent and Goal Directed  Orientation:  Full (Time, Place, and Person)  Thought Content:  Denies hallucinations, delusions, and paranoia  Suicidal Thoughts:  Yes.  with intent/plan  Homicidal Thoughts:  No  Memory:  Immediate;   Good Recent;   Good  Judgement:  Impaired  Insight:  Lacking  Psychomotor Activity:  Normal  Concentration: Concentration: Good  Recall:  Good  Fund of Knowledge:Good  Language: Good  Akathisia:  No  Handed:  Right  AIMS (if indicated):     Assets:  Communication Skills Desire for Improvement Housing Social Support  Sleep:       Musculoskeletal: Strength & Muscle Tone: within normal limits Gait & Station:  normal Patient leans: N/A  There were no vitals taken for this visit.  Recommendations:  Inpatient psychiatric treatment   Based on my evaluation the patient does not appear to have an emergency medical condition.  Shuvon Rankin, NP 12/15/2018, 2:01 PM

## 2018-12-16 DIAGNOSIS — F3132 Bipolar disorder, current episode depressed, moderate: Secondary | ICD-10-CM

## 2018-12-16 DIAGNOSIS — F411 Generalized anxiety disorder: Secondary | ICD-10-CM

## 2018-12-16 LAB — CBC WITH DIFFERENTIAL/PLATELET
Abs Immature Granulocytes: 0.02 10*3/uL (ref 0.00–0.07)
Basophils Absolute: 0 10*3/uL (ref 0.0–0.1)
Basophils Relative: 1 %
Eosinophils Absolute: 0.2 10*3/uL (ref 0.0–0.5)
Eosinophils Relative: 4 %
HCT: 39.1 % (ref 36.0–46.0)
Hemoglobin: 12.8 g/dL (ref 12.0–15.0)
Immature Granulocytes: 0 %
Lymphocytes Relative: 35 %
Lymphs Abs: 2 10*3/uL (ref 0.7–4.0)
MCH: 27.5 pg (ref 26.0–34.0)
MCHC: 32.7 g/dL (ref 30.0–36.0)
MCV: 84.1 fL (ref 80.0–100.0)
Monocytes Absolute: 0.4 10*3/uL (ref 0.1–1.0)
Monocytes Relative: 7 %
Neutro Abs: 3 10*3/uL (ref 1.7–7.7)
Neutrophils Relative %: 53 %
Platelets: 240 10*3/uL (ref 150–400)
RBC: 4.65 MIL/uL (ref 3.87–5.11)
RDW: 13 % (ref 11.5–15.5)
WBC: 5.7 10*3/uL (ref 4.0–10.5)
nRBC: 0 % (ref 0.0–0.2)

## 2018-12-16 LAB — URINALYSIS, COMPLETE (UACMP) WITH MICROSCOPIC
Bilirubin Urine: NEGATIVE
Glucose, UA: NEGATIVE mg/dL
Hgb urine dipstick: NEGATIVE
Ketones, ur: NEGATIVE mg/dL
Nitrite: NEGATIVE
Protein, ur: NEGATIVE mg/dL
Specific Gravity, Urine: 1.014 (ref 1.005–1.030)
pH: 6 (ref 5.0–8.0)

## 2018-12-16 LAB — LIPID PANEL
Cholesterol: 272 mg/dL — ABNORMAL HIGH (ref 0–200)
HDL: 42 mg/dL (ref >40)
LDL Cholesterol: 199 mg/dL — ABNORMAL HIGH (ref 0–99)
Total CHOL/HDL Ratio: 6.5 RATIO
Triglycerides: 156 mg/dL — ABNORMAL HIGH (ref <150)
VLDL: 31 mg/dL (ref 0–40)

## 2018-12-16 LAB — RAPID URINE DRUG SCREEN, HOSP PERFORMED
Amphetamines: NOT DETECTED
Barbiturates: NOT DETECTED
Benzodiazepines: NOT DETECTED
Cocaine: NOT DETECTED
Opiates: NOT DETECTED
Tetrahydrocannabinol: NOT DETECTED

## 2018-12-16 LAB — HEMOGLOBIN A1C
Hgb A1c MFr Bld: 5.4 % (ref 4.8–5.6)
Mean Plasma Glucose: 108.28 mg/dL

## 2018-12-16 LAB — TSH: TSH: 1.918 u[IU]/mL (ref 0.350–4.500)

## 2018-12-16 NOTE — Progress Notes (Signed)
Patient stated she slept a lot today. Patient's concern is getting a new counselor outside of here.

## 2018-12-16 NOTE — Progress Notes (Signed)
D: Pt denies SI/HI/AVH. Pt is pleasant and cooperative. Pt stated she felt better, pt thought all she needed was to talk to a therapist A: Pt was offered support and encouragement. Pt was given scheduled medications. Pt was encourage to attend groups. Q 15 minute checks were done for safety.  R:Pt attends groups and interacts well with peers and staff. Pt is taking medication. Pt has no complaints.Pt receptive to treatment and safety maintained on unit.  Problem: Education: Goal: Emotional status will improve Outcome: Progressing   Problem: Education: Goal: Mental status will improve Outcome: Progressing

## 2018-12-16 NOTE — Progress Notes (Signed)
Patient ID: LEMPI EDWIN, female   DOB: 1966/03/04, 53 y.o.   MRN: [id number] D: Pt with blunted affect, mood depressed, denies SI/HI/AVH, reports that her sleep quality last night was fair, denies having any concerns, took all meds this morning as scheduled.  A: Pt being maintained on Q15 minute checks for safety.  R: Will continue to monitor on Q15 minute checks and will address any concerns as they might arise.

## 2018-12-16 NOTE — Plan of Care (Signed)
D: Endorses passive SI and VH. Denies HI, AH, and verbally contracts for safety.    A: Medications administered per MD order. Support provided. Patient educated on safety on the unit and medications. Routine safety checks every 15 minutes. Patient stated understanding to tell nurse about any new physical symptoms. Patient understands to tell staff of any needs.     R: No adverse drug reactions noted. Patient verbally contracts for safety. Patient remains safe at this time and will continue to monitor.   Problem: Safety: Goal: Periods of time without injury will increase Outcome: Progressing   Patient remains safe and will continue to monitor.   [place] NOVEL CORONAVIRUS (COVID-19) DAILY CHECK-OFF SYMPTOMS - answer yes or no to each - every day NO YES  Have you had a fever in the past 24 hours?  Fever (Temp > 37.80C / 100F) X   Have you had any of these symptoms in the past 24 hours? New Cough  Sore Throat   Shortness of Breath  Difficulty Breathing  Unexplained Body Aches   X   Have you had any one of these symptoms in the past 24 hours not related to allergies?   Runny Nose  Nasal Congestion  Sneezing   X   If you have had runny nose, nasal congestion, sneezing in the past 24 hours, has it worsened?  X   EXPOSURES - check yes or no X   Have you traveled outside the state in the past 14 days?  X   Have you been in contact with someone with a confirmed diagnosis of COVID-19 or PUI in the past 14 days without wearing appropriate PPE?  X   Have you been living in the same home as a person with confirmed diagnosis of COVID-19 or a PUI (household contact)?    X   Have you been diagnosed with COVID-19?    X              What to do next: Answered NO to all: Answered YES to anything:   Proceed with unit schedule Follow the [facility] Inpatient Flowsheet.

## 2018-12-16 NOTE — Progress Notes (Signed)
[facility] MD Progress Note  12/16/2018 1:33 PM Stefanie Galvan  MRN:  [id number] Subjective:  *Patient is a 53 year old female with a past psychiatric history significant for bipolar disorder who was admitted on 12/15/2018 with suicidal ideation.  Objective: Patient is seen and examined.  Patient is a 53 year old female with the above-stated past psychiatric history who is seen in follow-up.  The patient contacted her outpatient psychiatrist (Dr [name]) and told him that she was having increased suicidal ideation.  She stated that she was having stressors from home home and at work.  She felt devalued both at home and at work, and led to worsening mixed bipolar symptoms.  She was admitted by Dr. Jama Flavorsobos, who decreased her Lexapro to 5 mg p.o. daily.  She stated today she feels a little bit better.  She feels like her mind is not racing as much.  We had a long discussion about the stressors at home and feeling devalued by her mother as well as her work situation.  She denied any suicidal ideation today.  She stated she felt like she was doing better.  Review of her laboratories showed a mildly elevated glucose, elevated cholesterol, but normal CBC with differential, TSH as well as hemoglobin A1c.  Her urine did have a trace of leukocyte Estrace positive as well as bacteria.  There is 0-5 white blood cells present.  Drug screen was negative.  Her vital signs are stable, she is afebrile.  She slept 6.75 hours last night.  Principal Problem: <principal problem not specified> Diagnosis: Active Problems:   MDD (major depressive disorder), recurrent episode, severe (HCC)  Total Time spent with patient: 15 minutes  Past Psychiatric History: See admission H&P  Past Medical History:  Past Medical History:  Diagnosis Date  . Anxiety   . Arthritis   . Depression 2.5 yrs ago   situational dep-no current tx/meds  . TBI (traumatic brain injury) (HCC) 11/10/2005    Past Surgical History:  Procedure Laterality  Date  . BREAST REDUCTION SURGERY  02/25/2012   Procedure: MAMMARY REDUCTION  (BREAST);  Surgeon: Louisa SecondGerald Truesdale, MD;  Location: [facility];  Service: Plastics;  Laterality: Bilateral;  bilateral reduction  . CHOLECYSTECTOMY N/A 07/01/2018   Procedure: LAPAROSCOPIC CHOLECYSTECTOMY;  Surgeon: Sung AmabileSakai, Isami, DO;  Location: [facility];  Service: General;  Laterality: N/A;  . DILATION AND CURETTAGE OF UTERUS  2001  . TEMPOROMANDIBULAR JOINT SURGERY  1984; 1996   X 2; bilateral  . TUBAL LIGATION  2003   Family History:  Family History  Problem Relation Age of Onset  . Depression Father   . Anxiety disorder Brother   . Depression Maternal Grandmother    Family Psychiatric  History: See admission H&P Social History:  Social History   Substance and Sexual Activity  Alcohol Use Not Currently     Social History   Substance and Sexual Activity  Drug Use No    Social History   Socioeconomic History  . Marital status: Divorced    Spouse name: Not on file  . Number of children: Not on file  . Years of education: Not on file  . Highest education level: Not on file  Occupational History  . Not on file  Social Needs  . Financial resource strain: Not on file  . Food insecurity    Worry: Not on file    Inability: Not on file  . Transportation needs    Medical: Not on file    Non-medical: Not on  file  Tobacco Use  . Smoking status: Never Smoker  . Smokeless tobacco: Never Used  Substance and Sexual Activity  . Alcohol use: Not Currently  . Drug use: No  . Sexual activity: Not Currently  Lifestyle  . Physical activity    Days per week: Not on file    Minutes per session: Not on file  . Stress: Not on file  Relationships  . Social Musicianconnections    Talks on phone: Not on file    Gets together: Not on file    Attends religious service: Not on file    Active member of club or organization: Not on file    Attends meetings of clubs or organizations: Not on file     Relationship status: Not on file  Other Topics Concern  . Not on file  Social History Narrative  . Not on file   Additional Social History:    Pain Medications: see MAR Prescriptions: see MAR Over the Counter: see MAR History of alcohol / drug use?: No history of alcohol / drug abuse                    Sleep: Good  Appetite:  Good  Current Medications: Current Facility-Administered Medications  Medication Dose Route Frequency Provider Last Rate Last Dose  . ARIPiprazole (ABILIFY) tablet 10 mg  10 mg Oral Daily Cobos, Rockey SituFernando A, MD   10 mg at 12/16/18 0806  . escitalopram (LEXAPRO) tablet 5 mg  5 mg Oral Daily Cobos, Rockey SituFernando A, MD   5 mg at 12/16/18 0807  . lamoTRIgine (LAMICTAL) tablet 200 mg  200 mg Oral Daily Rankin, Shuvon B, NP   200 mg at 12/16/18 0807  . LORazepam (ATIVAN) tablet 1 mg  1 mg Oral Q8H PRN Cobos, Rockey SituFernando A, MD   1 mg at 12/15/18 2119    Lab Results:  Results for orders placed or performed during the hospital encounter of 12/15/18 (from the past 48 hour(s))  Comprehensive metabolic panel     Status: Abnormal   Collection Time: 12/15/18  6:38 PM  Result Value Ref Range   Sodium 139 135 - 145 mmol/L   Potassium 4.0 3.5 - 5.1 mmol/L   Chloride 106 98 - 111 mmol/L   CO2 25 22 - 32 mmol/L   Glucose, Bld 105 (H) 70 - 99 mg/dL   BUN 12 6 - 20 mg/dL   Creatinine, Ser 2.130.81 0.44 - 1.00 mg/dL   Calcium 9.3 8.9 - 08.610.3 mg/dL   Total Protein 7.4 6.5 - 8.1 g/dL   Albumin 4.3 3.5 - 5.0 g/dL   AST 22 15 - 41 U/L   ALT 26 0 - 44 U/L   Alkaline Phosphatase 76 38 - 126 U/L   Total Bilirubin 0.5 0.3 - 1.2 mg/dL   GFR calc non Af Amer >60 >60 mL/min   GFR calc Af Amer >60 >60 mL/min   Anion gap 8 5 - 15    Comment: Performed at [facility], [address] [place], [place] [address]  Urine rapid drug screen (hosp performed)not at [facility]     Status: None   Collection Time: 12/15/18  8:07 PM  Result Value Ref Range   Opiates NONE DETECTED  NONE DETECTED   Cocaine NONE DETECTED NONE DETECTED   Benzodiazepines NONE DETECTED NONE DETECTED   Amphetamines NONE DETECTED NONE DETECTED   Tetrahydrocannabinol NONE DETECTED NONE DETECTED   Barbiturates NONE DETECTED NONE DETECTED  Comment: (NOTE) DRUG SCREEN FOR MEDICAL PURPOSES ONLY.  IF CONFIRMATION IS NEEDED FOR ANY PURPOSE, NOTIFY LAB WITHIN 5 DAYS. LOWEST DETECTABLE LIMITS FOR URINE DRUG SCREEN Drug Class                     Cutoff (ng/mL) Amphetamine and metabolites    1000 Barbiturate and metabolites    200 Benzodiazepine                 200 Tricyclics and metabolites     300 Opiates and metabolites        300 Cocaine and metabolites        300 THC                            50 Performed at [facility], [address] [place], [place] [address]   Urinalysis, Complete w Microscopic     Status: Abnormal   Collection Time: 12/15/18  8:07 PM  Result Value Ref Range   Color, Urine YELLOW YELLOW   APPearance HAZY (A) CLEAR   Specific Gravity, Urine 1.014 1.005 - 1.030   pH 6.0 5.0 - 8.0   Glucose, UA NEGATIVE NEGATIVE mg/dL   Hgb urine dipstick NEGATIVE NEGATIVE   Bilirubin Urine NEGATIVE NEGATIVE   Ketones, ur NEGATIVE NEGATIVE mg/dL   Protein, ur NEGATIVE NEGATIVE mg/dL   Nitrite NEGATIVE NEGATIVE   Leukocytes,Ua TRACE (A) NEGATIVE   RBC / HPF 0-5 0 - 5 RBC/hpf   WBC, UA 0-5 0 - 5 WBC/hpf   Bacteria, UA RARE (A) NONE SEEN   Squamous Epithelial / LPF 6-10 0 - 5   Mucus PRESENT     Comment: Performed at [facility], [address] [place], [place] [address]  CBC with Differential/Platelet     Status: None   Collection Time: 12/16/18  6:32 AM  Result Value Ref Range   WBC 5.7 4.0 - 10.5 K/uL   RBC 4.65 3.87 - 5.11 MIL/uL   Hemoglobin 12.8 12.0 - 15.0 g/dL   HCT 25.339.1 66.436.0 - 40.346.0 %   MCV 84.1 80.0 - 100.0 fL   MCH 27.5 26.0 - 34.0 pg   MCHC 32.7 30.0 - 36.0 g/dL   RDW 47.413.0 25.911.5 - 56.315.5 %   Platelets 240 150 - 400  K/uL   nRBC 0.0 0.0 - 0.2 %   Neutrophils Relative % 53 %   Neutro Abs 3.0 1.7 - 7.7 K/uL   Lymphocytes Relative 35 %   Lymphs Abs 2.0 0.7 - 4.0 K/uL   Monocytes Relative 7 %   Monocytes Absolute 0.4 0.1 - 1.0 K/uL   Eosinophils Relative 4 %   Eosinophils Absolute 0.2 0.0 - 0.5 K/uL   Basophils Relative 1 %   Basophils Absolute 0.0 0.0 - 0.1 K/uL   Immature Granulocytes 0 %   Abs Immature Granulocytes 0.02 0.00 - 0.07 K/uL    Comment: Performed at [facility], [address] [place], [place] [address]  TSH     Status: None   Collection Time: 12/16/18  6:32 AM  Result Value Ref Range   TSH 1.918 0.350 - 4.500 uIU/mL    Comment: Performed by a 3rd Generation assay with a functional sensitivity of <=0.01 uIU/mL. Performed at [facility], [address] [place], [place] [address]   Lipid panel     Status: Abnormal   Collection Time: 12/16/18  6:32 AM  Result Value Ref Range   Cholesterol 272 (H) 0 - 200 mg/dL   Triglycerides 161 (H) <150 mg/dL   HDL 42 >09 mg/dL   Total CHOL/HDL Ratio 6.5 RATIO   VLDL 31 0 - 40 mg/dL   LDL Cholesterol 604 (H) 0 - 99 mg/dL    Comment:        Total Cholesterol/HDL:CHD Risk Coronary Heart Disease Risk Table                     Men   Women  1/2 Average Risk   3.4   3.3  Average Risk       5.0   4.4  2 X Average Risk   9.6   7.1  3 X Average Risk  23.4   11.0        Use the calculated Patient Ratio above and the CHD Risk Table to determine the patient's CHD Risk.        ATP III CLASSIFICATION (LDL):  <100     mg/dL   Optimal  540-981  mg/dL   Near or Above                    Optimal  130-159  mg/dL   Borderline  191-478  mg/dL   High  >295     mg/dL   Very High Performed at [facility], [address] [place], [place] [address]   Hemoglobin A1c     Status: None   Collection Time: 12/16/18  6:32 AM  Result Value Ref Range   Hgb A1c MFr Bld 5.4 4.8 - 5.6 %    Comment:  (NOTE) Pre diabetes:          5.7%-6.4% Diabetes:              >6.4% Glycemic control for   <7.0% adults with diabetes    Mean Plasma Glucose 108.28 mg/dL    Comment: Performed at [facility], [address], [place], [place] [address]    Blood Alcohol level:  Lab Results  Component Value Date   ETH <10 09/22/2017   ETH  09/12/2008    <5        LOWEST DETECTABLE LIMIT FOR SERUM ALCOHOL IS 5 mg/dL FOR MEDICAL PURPOSES ONLY    Metabolic Disorder Labs: Lab Results  Component Value Date   HGBA1C 5.4 12/16/2018   MPG 108.28 12/16/2018   MPG 93.93 03/19/2017   No results found for: PROLACTIN Lab Results  Component Value Date   CHOL 272 (H) 12/16/2018   TRIG 156 (H) 12/16/2018   HDL 42 12/16/2018   CHOLHDL 6.5 12/16/2018   VLDL 31 12/16/2018   LDLCALC 199 (H) 12/16/2018   LDLCALC 179 (H) 03/20/2017    Physical Findings: AIMS: Facial and Oral Movements Muscles of Facial Expression: None, normal Lips and Perioral Area: None, normal Jaw: None, normal Tongue: None, normal,Extremity Movements Upper (arms, wrists, hands, fingers): None, normal Lower (legs, knees, ankles, toes): None, normal, Trunk Movements Neck, shoulders, hips: None, normal, Overall Severity Severity of abnormal movements (highest score from questions above): None, normal Incapacitation due to abnormal movements: None, normal Patient's awareness of abnormal movements (rate only patient's report): No Awareness, Dental Status Current problems with teeth and/or dentures?: No Does patient usually wear dentures?: No  CIWA:    COWS:     Musculoskeletal: Strength & Muscle Tone: within normal limits Gait & Station: normal Patient leans:  N/A  Psychiatric Specialty Exam: Physical Exam  Nursing note and vitals reviewed. Constitutional: She is oriented to person, place, and time. She appears well-developed and well-nourished.  HENT:  Head: Normocephalic and atraumatic.  Respiratory: Effort normal.   Neurological: She is alert and oriented to person, place, and time.    ROS  Blood pressure 123/81, pulse 85, temperature 98.1 F (36.7 C), temperature source Oral, resp. rate 18, height 5\' 9"  (1.753 m), weight 104.3 kg, SpO2 100 %.Body mass index is 33.97 kg/m.  General Appearance: Casual  Eye Contact:  Fair  Speech:  Normal Rate  Volume:  Normal  Mood:  Anxious  Affect:  Congruent  Thought Process:  Coherent and Descriptions of Associations: Intact  Orientation:  Full (Time, Place, and Person)  Thought Content:  Logical  Suicidal Thoughts:  No  Homicidal Thoughts:  No  Memory:  Immediate;   Fair Recent;   Fair Remote;   Fair  Judgement:  Intact  Insight:  Fair  Psychomotor Activity:  Increased  Concentration:  Concentration: Fair and Attention Span: Fair  Recall:  [employer] of Knowledge:  Good  Language:  Good  Akathisia:  Negative  Handed:  Right  AIMS (if indicated):     Assets:  Desire for Improvement Resilience  ADL's:  Intact  Cognition:  WNL  Sleep:  Number of Hours: 6.75     Treatment Plan Summary: Daily contact with patient to assess and evaluate symptoms and progress in treatment, Medication management and Plan : Patient is seen and examined.  Patient is a 53 year old female with the above-stated past psychiatric history who is seen in follow-up.   Diagnosis: #1 bipolar disorder, most recently depressed, severe without psychotic features, generalized anxiety disorder  Patient is seen in follow-up.  She states she feels better with the medication changes that Dr. Parke Poisson instituted.  She denies suicidality.  She is not pressured currently.  She is anxious.  She also was continued on her Abilify 10 mg p.o. daily, her Lamictal 200 mg p.o. daily and lorazepam as needed for anxiety.  No changes in her current medications.  Hopefully she will continue to improve.  She also has had some issues with finding a new therapist, and I think that will be beneficial for her  after discharge. 1.  Continue Abilify 10 mg p.o. daily for mood stability and depression. 2.  Continue Lexapro 5 mg p.o. daily for mood and anxiety. 3.  Continue Lamictal 200 mg p.o. daily for mood stability. 4.  Continue lorazepam 1 mg p.o. every 8 hours as needed anxiety. 5.  Continue hydroxyzine 25 mg p.o. 3 times daily as needed anxiety. 6.  Disposition planning-in progress. Sharma Covert, MD 12/16/2018, 1:33 PM
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
## 2018-12-17 DIAGNOSIS — F411 Generalized anxiety disorder: Secondary | ICD-10-CM

## 2018-12-17 MED ORDER — ACETAMINOPHEN 325 MG PO TABS: 650.0000 mg | ORAL_TABLET | Freq: Four times a day (QID) | ORAL | Status: DC | PRN

## 2018-12-17 MED ORDER — CYCLOBENZAPRINE HCL 5 MG PO TABS
5.0000 mg | ORAL_TABLET | Freq: Three times a day (TID) | ORAL | Status: DC
  Administered 2018-12-17 – 2018-12-18 (×3): 5 mg via ORAL
  Filled 2018-12-17 (×6): qty 1

## 2018-12-17 MED ORDER — IBUPROFEN 400 MG PO TABS: 400.0000 mg | ORAL_TABLET | Freq: Four times a day (QID) | ORAL | Status: DC | PRN

## 2018-12-17 NOTE — Tx Team (Signed)
Interdisciplinary Treatment and Diagnostic Plan Update  12/17/2018 Time of Session:  Stefanie CritchleyKimberly D Galvan MRN: [id number]  Principal Diagnosis: <principal problem not specified>  Secondary Diagnoses: Active Problems:   MDD (major depressive disorder), recurrent episode, severe (HCC)   Current Medications:  Current Facility-Administered Medications  Medication Dose Route Frequency Provider Last Rate Last Dose  . ARIPiprazole (ABILIFY) tablet 10 mg  10 mg Oral Daily Cobos, Rockey SituFernando A, MD   10 mg at 12/17/18 [id number]  . escitalopram (LEXAPRO) tablet 5 mg  5 mg Oral Daily Cobos, Rockey SituFernando A, MD   5 mg at 12/17/18 [id number]  . lamoTRIgine (LAMICTAL) tablet 200 mg  200 mg Oral Daily Rankin, Shuvon B, NP   200 mg at 12/17/18 0821  . LORazepam (ATIVAN) tablet 1 mg  1 mg Oral Q8H PRN Cobos, Rockey SituFernando A, MD   1 mg at 12/16/18 2102   PTA Medications: Medications Prior to Admission  Medication Sig Dispense Refill Last Dose  . ARIPiprazole (ABILIFY PO) Take 1 tablet by mouth daily. Unknown Strength     . augmented betamethasone dipropionate (DIPROLENE-AF) 0.05 % cream Apply 1 application topically 2 (two) times a day. On active rash and then apply as needed     . escitalopram (LEXAPRO) 10 MG tablet Take 1 tablet (10 mg total) by mouth at bedtime. For depression 90 tablet 0   . lamoTRIgine (LAMICTAL) 200 MG tablet Take 1 tablet (200 mg total) by mouth daily. 30 tablet 0   . docusate sodium (COLACE) 100 MG capsule Take 1 capsule (100 mg total) by mouth 2 (two) times daily as needed for mild constipation. (Patient not taking: Reported on 12/16/2018) 10 capsule 0 Completed Course at Unknown time    Patient Stressors: Health problems Marital or family conflict Occupational concerns  Patient Strengths: Ability for insight Average or above average intelligence Communication skills General fund of knowledge Motivation for treatment/growth Physical Health Work skills  Treatment Modalities: Medication  Management, Group therapy, Case management,  1 to 1 session with clinician, Psychoeducation, Recreational therapy.   Physician Treatment Plan for Primary Diagnosis: <principal problem not specified> Long Term Goal(s): Improvement in symptoms so as ready for discharge Improvement in symptoms so as ready for discharge   Short Term Goals: Ability to identify changes in lifestyle to reduce recurrence of condition will improve Ability to verbalize feelings will improve Ability to disclose and discuss suicidal ideas Ability to demonstrate self-control will improve Ability to identify and develop effective coping behaviors will improve Ability to maintain clinical measurements within normal limits will improve Ability to identify changes in lifestyle to reduce recurrence of condition will improve Ability to verbalize feelings will improve Ability to disclose and discuss suicidal ideas Ability to demonstrate self-control will improve Ability to identify and develop effective coping behaviors will improve  Medication Management: Evaluate patient's response, side effects, and tolerance of medication regimen.  Therapeutic Interventions: 1 to 1 sessions, Unit Group sessions and Medication administration.  Evaluation of Outcomes: Progressing  Physician Treatment Plan for Secondary Diagnosis: Active Problems:   MDD (major depressive disorder), recurrent episode, severe (HCC)  Long Term Goal(s): Improvement in symptoms so as ready for discharge Improvement in symptoms so as ready for discharge   Short Term Goals: Ability to identify changes in lifestyle to reduce recurrence of condition will improve Ability to verbalize feelings will improve Ability to disclose and discuss suicidal ideas Ability to demonstrate self-control will improve Ability to identify and develop effective coping behaviors will improve Ability to maintain clinical  measurements within normal limits will improve Ability to  identify changes in lifestyle to reduce recurrence of condition will improve Ability to verbalize feelings will improve Ability to disclose and discuss suicidal ideas Ability to demonstrate self-control will improve Ability to identify and develop effective coping behaviors will improve     Medication Management: Evaluate patient's response, side effects, and tolerance of medication regimen.  Therapeutic Interventions: 1 to 1 sessions, Unit Group sessions and Medication administration.  Evaluation of Outcomes: Progressing   RN Treatment Plan for Primary Diagnosis: <principal problem not specified> Long Term Goal(s): Knowledge of disease and therapeutic regimen to maintain health will improve  Short Term Goals: Ability to participate in decision making will improve, Ability to verbalize feelings will improve, Ability to disclose and discuss suicidal ideas and Ability to identify and develop effective coping behaviors will improve  Medication Management: RN will administer medications as ordered by provider, will assess and evaluate patient's response and provide education to patient for prescribed medication. RN will report any adverse and/or side effects to prescribing provider.  Therapeutic Interventions: 1 on 1 counseling sessions, Psychoeducation, Medication administration, Evaluate responses to treatment, Monitor vital signs and CBGs as ordered, Perform/monitor CIWA, COWS, AIMS and Fall Risk screenings as ordered, Perform wound care treatments as ordered.  Evaluation of Outcomes: Progressing   LCSW Treatment Plan for Primary Diagnosis: <principal problem not specified> Long Term Goal(s): Safe transition to appropriate next level of care at discharge, Engage patient in therapeutic group addressing interpersonal concerns.  Short Term Goals: Engage patient in aftercare planning with referrals and resources  Therapeutic Interventions: Assess for all discharge needs, 1 to 1 time with  Social worker, Explore available resources and support systems, Assess for adequacy in community support network, Educate family and significant other(s) on suicide prevention, Complete Psychosocial Assessment, Interpersonal group therapy.  Evaluation of Outcomes: Adequate for Discharge   Progress in Treatment: Attending groups: No. Participating in groups: No. Taking medication as prescribed: Yes. Toleration medication: Yes. Family/Significant other contact made: No, will contact:  the patient's mother Patient understands diagnosis: Yes. Discussing patient identified problems/goals with staff: Yes. Medical problems stabilized or resolved: Yes. Denies suicidal/homicidal ideation: Yes. Issues/concerns per patient self-inventory: No. Other:   New problem(s) identified: None   New Short Term/Long Term Goal(s):medication stabilization, elimination of SI thoughts, development of comprehensive mental wellness plan.    Patient Goals:    Discharge Plan or Barriers: Patient plans to return home with her mother. She will continue to follow up [facility] outpatient for medication management and therapy services. Patient has been referred to the [facility] for continuity of care at discharge. CSW will continue to follow.   Reason for Continuation of Hospitalization: Anxiety Depression Medication stabilization  Estimated Length of Stay: 3-5 days   Attendees: Patient: 12/17/2018 10:00 AM  Physician: Dr. Myles Lipps, MD 12/17/2018 10:00 AM  Nursing: Estill Bamberg.Loletha Grayer, RN 12/17/2018 10:00 AM  RN Care Manager: 12/17/2018 10:00 AM  Social Worker: Radonna Ricker, [place] 12/17/2018 10:00 AM  Recreational Therapist:  12/17/2018 10:00 AM  Other:  12/17/2018 10:00 AM  Other:  12/17/2018 10:00 AM  Other: 12/17/2018 10:00 AM    Scribe for Treatment Team: Marylee Floras, [place] 12/17/2018 10:00 AM

## 2018-12-17 NOTE — Progress Notes (Signed)
[facility]  12/17/2018 11:41 AM Stefanie Galvan  MRN:  [id number] Subjective:  Patient is a 53 year old female with a past psychiatric history significant for bipolar disorder who was admitted on 12/15/2018 with suicidal ideation.  Objective: Patient is seen and examined.  Patient is a [age] year old female with the above-stated past psychiatric history who is seen in follow-up.  Her only complaint today is that her back and neck are hurting.  She stated she had had Flexeril in the past, and I have written that for her.  She stated that her mood and everything else were improving.  She stated that changes in her medication with the lowering of the Lexapro and the addition of the Abilify it helped.  She stated she wanted to get back with her new therapist.  She stated she still very tearful about the way that her last therapist left.  She denied any suicidal ideation.  She denied any side effects to her current medications.  Her vital signs are stable, she is afebrile.  She slept 6.5 hours last night.  Principal Problem: <principal problem not specified> Diagnosis: Active Problems:   MDD (major depressive disorder), recurrent episode, severe (HCC)  Total Time spent with patient: 15 minutes  Past Psychiatric History: See admission H&P  Past Medical History:  Past Medical History:  Diagnosis Date  . Anxiety   . Arthritis   . Depression 2.5 yrs ago   situational dep-no current tx/meds  . TBI (traumatic brain injury) (HCC) 11/10/2005    Past Surgical History:  Procedure Laterality Date  . BREAST REDUCTION SURGERY  02/25/2012   Procedure: MAMMARY REDUCTION  (BREAST);  Surgeon: Louisa SecondGerald Truesdale, MD;  Location: [facility];  Service: Plastics;  Laterality: Bilateral;  bilateral reduction  . CHOLECYSTECTOMY N/A 07/01/2018   Procedure: LAPAROSCOPIC CHOLECYSTECTOMY;  Surgeon: Sung AmabileSakai, Isami, DO;  Location: [facility];  Service: General;  Laterality: N/A;  . DILATION AND CURETTAGE  OF UTERUS  2001  . TEMPOROMANDIBULAR JOINT SURGERY  1984; 1996   X 2; bilateral  . TUBAL LIGATION  2003   Family History:  Family History  Problem Relation Age of Onset  . Depression Father   . Anxiety disorder Brother   . Depression Maternal Grandmother    Family Psychiatric  History: See admission H&P Social History:  Social History   Substance and Sexual Activity  Alcohol Use Not Currently     Social History   Substance and Sexual Activity  Drug Use No    Social History   Socioeconomic History  . Marital status: Divorced    Spouse name: Not on file  . Number of children: Not on file  . Years of education: Not on file  . Highest education level: Not on file  Occupational History  . Not on file  Social Needs  . Financial resource strain: Not on file  . Food insecurity    Worry: Not on file    Inability: Not on file  . Transportation needs    Medical: Not on file    Non-medical: Not on file  Tobacco Use  . Smoking status: Never Smoker  . Smokeless tobacco: Never Used  Substance and Sexual Activity  . Alcohol use: Not Currently  . Drug use: No  . Sexual activity: Not Currently  Lifestyle  . Physical activity    Days per week: Not on file    Minutes per session: Not on file  . Stress: Not on file  Relationships  .  Social Musicianconnections    Talks on phone: Not on file    Gets together: Not on file    Attends religious service: Not on file    Active member of club or organization: Not on file    Attends meetings of clubs or organizations: Not on file    Relationship status: Not on file  Other Topics Concern  . Not on file  Social History Narrative  . Not on file   Additional Social History:    Pain Medications: see MAR Prescriptions: see MAR Over the Counter: see MAR History of alcohol / drug use?: No history of alcohol / drug abuse                    Sleep: Good  Appetite:  Fair  Current Medications: Current Facility-Administered  Medications  Medication Dose Route Frequency Provider Last Rate Last Dose  . acetaminophen (TYLENOL) tablet 650 mg  650 mg Oral Q6H PRN Antonieta Pertlary, Bhavya Eschete Lawson, MD      . ARIPiprazole (ABILIFY) tablet 10 mg  10 mg Oral Daily Cobos, Rockey SituFernando A, MD   10 mg at 12/17/18 [id number]  . cyclobenzaprine (FLEXERIL) tablet 5 mg  5 mg Oral TID Antonieta Pertlary, Elo Marmolejos Lawson, MD      . escitalopram [facility](LEXAPRO) tablet 5 mg  5 mg Oral Daily Cobos, Rockey SituFernando A, MD   5 mg at 12/17/18 [id number]  . ibuprofen (ADVIL) tablet 400 mg  400 mg Oral Q6H PRN Antonieta Pertlary, Coley Littles Lawson, MD      . lamoTRIgine (LAMICTAL) tablet 200 mg  200 mg Oral Daily Rankin, Shuvon B, NP   200 mg at 12/17/18 0821  . LORazepam (ATIVAN) tablet 1 mg  1 mg Oral Q8H PRN Cobos, Rockey SituFernando A, MD   1 mg at 12/16/18 2102    Lab Results:  Results for orders placed or performed during the hospital encounter of 12/15/18 (from the past 48 hour(s))  Comprehensive metabolic panel     Status: Abnormal   Collection Time: 12/15/18  6:38 PM  Result Value Ref Range   Sodium 139 135 - 145 mmol/L   Potassium 4.0 3.5 - 5.1 mmol/L   Chloride 106 98 - 111 mmol/L   CO2 25 22 - 32 mmol/L   Glucose, Bld 105 (H) 70 - 99 mg/dL   BUN 12 6 - 20 mg/dL   Creatinine, Ser 5.400.81 0.44 - 1.00 mg/dL   Calcium 9.3 8.9 - 98.110.3 mg/dL   Total Protein 7.4 6.5 - 8.1 g/dL   Albumin 4.3 3.5 - 5.0 g/dL   AST 22 15 - 41 U/L   ALT 26 0 - 44 U/L   Alkaline Phosphatase 76 38 - 126 U/L   Total Bilirubin 0.5 0.3 - 1.2 mg/dL   GFR calc non Af Amer >60 >60 mL/min   GFR calc Af Amer >60 >60 mL/min   Anion gap 8 5 - 15    Comment: Performed at [facility], [address] [place], [place] [address]  Urine rapid drug screen (hosp performed)not at [facility]     Status: None   Collection Time: 12/15/18  8:07 PM  Result Value Ref Range   Opiates NONE DETECTED NONE DETECTED   Cocaine NONE DETECTED NONE DETECTED   Benzodiazepines NONE DETECTED NONE DETECTED   Amphetamines NONE DETECTED NONE DETECTED    Tetrahydrocannabinol NONE DETECTED NONE DETECTED   Barbiturates NONE DETECTED NONE DETECTED    Comment: (NOTE) DRUG SCREEN FOR MEDICAL PURPOSES ONLY.  IF  CONFIRMATION IS NEEDED FOR ANY PURPOSE, NOTIFY LAB WITHIN 5 DAYS. LOWEST DETECTABLE LIMITS FOR URINE DRUG SCREEN Drug Class                     Cutoff (ng/mL) Amphetamine and metabolites    1000 Barbiturate and metabolites    200 Benzodiazepine                 665 Tricyclics and metabolites     300 Opiates and metabolites        300 Cocaine and metabolites        300 THC                            50 Performed at [facility], [place] [address], [place], [place] [address]   Urinalysis, Complete w Microscopic     Status: Abnormal   Collection Time: 12/15/18  8:07 PM  Result Value Ref Range   Color, Urine YELLOW YELLOW   APPearance HAZY (A) CLEAR   Specific Gravity, Urine 1.014 1.005 - 1.030   pH 6.0 5.0 - 8.0   Glucose, UA NEGATIVE NEGATIVE mg/dL   Hgb urine dipstick NEGATIVE NEGATIVE   Bilirubin Urine NEGATIVE NEGATIVE   Ketones, ur NEGATIVE NEGATIVE mg/dL   Protein, ur NEGATIVE NEGATIVE mg/dL   Nitrite NEGATIVE NEGATIVE   Leukocytes,Ua TRACE (A) NEGATIVE   RBC / HPF 0-5 0 - 5 RBC/hpf   WBC, UA 0-5 0 - 5 WBC/hpf   Bacteria, UA RARE (A) NONE SEEN   Squamous Epithelial / LPF 6-10 0 - 5   Mucus PRESENT     Comment: Performed at [facility], [place] [address], [place], [place] [address]  CBC with Differential/Platelet     Status: None   Collection Time: 12/16/18  6:32 AM  Result Value Ref Range   WBC 5.7 4.0 - 10.5 K/uL   RBC 4.65 3.87 - 5.11 MIL/uL   Hemoglobin 12.8 12.0 - 15.0 g/dL   HCT 39.1 36.0 - 46.0 %   MCV 84.1 80.0 - 100.0 fL   MCH 27.5 26.0 - 34.0 pg   MCHC 32.7 30.0 - 36.0 g/dL   RDW 13.0 11.5 - 15.5 %   Platelets 240 150 - 400 K/uL   nRBC 0.0 0.0 - 0.2 %   Neutrophils Relative % 53 %   Neutro Abs 3.0 1.7 - 7.7 K/uL   Lymphocytes Relative 35 %   Lymphs Abs 2.0 0.7 - 4.0  K/uL   Monocytes Relative 7 %   Monocytes Absolute 0.4 0.1 - 1.0 K/uL   Eosinophils Relative 4 %   Eosinophils Absolute 0.2 0.0 - 0.5 K/uL   Basophils Relative 1 %   Basophils Absolute 0.0 0.0 - 0.1 K/uL   Immature Granulocytes 0 %   Abs Immature Granulocytes 0.02 0.00 - 0.07 K/uL    Comment: Performed at [facility], [place] [address], [place], [place] [address]  TSH     Status: None   Collection Time: 12/16/18  6:32 AM  Result Value Ref Range   TSH 1.918 0.350 - 4.500 uIU/mL    Comment: Performed by a 3rd Generation assay with a functional sensitivity of <=0.01 uIU/mL. Performed at [facility], [place] [address], [place], [place] [address]   Lipid panel     Status: Abnormal   Collection Time: 12/16/18  6:32 AM  Result Value Ref Range  Cholesterol 272 (H) 0 - 200 mg/dL   Triglycerides 161156 (H) <150 mg/dL   HDL 42 >09>40 mg/dL   Total CHOL/HDL Ratio 6.5 RATIO   VLDL 31 0 - 40 mg/dL   LDL Cholesterol 604199 (H) 0 - 99 mg/dL    Comment:        Total Cholesterol/HDL:CHD Risk Coronary Heart Disease Risk Table                     Men   Women  1/2 Average Risk   3.4   3.3  Average Risk       5.0   4.4  2 X Average Risk   9.6   7.1  3 X Average Risk  23.4   11.0        Use the calculated Patient Ratio above and the CHD Risk Table to determine the patient's CHD Risk.        ATP III CLASSIFICATION (LDL):  <100     mg/dL   Optimal  540-981100-129  mg/dL   Near or Above                    Optimal  130-159  mg/dL   Borderline  191-478160-189  mg/dL   High  >295>190     mg/dL   Very High Performed at [facility], [address] [place], [place] [address]   Hemoglobin A1c     Status: None   Collection Time: 12/16/18  6:32 AM  Result Value Ref Range   Hgb A1c MFr Bld 5.4 4.8 - 5.6 %    Comment: (NOTE) Pre diabetes:          5.7%-6.4% Diabetes:              >6.4% Glycemic control for   <7.0% adults with diabetes    Mean Plasma Glucose 108.28  mg/dL    Comment: Performed at [facility], [address] [place], [place] [address]    Blood Alcohol level:  Lab Results  Component Value Date   ETH <10 09/22/2017   ETH  09/12/2008    <5        LOWEST DETECTABLE LIMIT FOR SERUM ALCOHOL IS 5 mg/dL FOR MEDICAL PURPOSES ONLY    Metabolic Disorder Labs: Lab Results  Component Value Date   HGBA1C 5.4 12/16/2018   MPG 108.28 12/16/2018   MPG 93.93 03/19/2017   No results found for: PROLACTIN Lab Results  Component Value Date   CHOL 272 (H) 12/16/2018   TRIG 156 (H) 12/16/2018   HDL 42 12/16/2018   CHOLHDL 6.5 12/16/2018   VLDL 31 12/16/2018   LDLCALC 199 (H) 12/16/2018   LDLCALC 179 (H) 03/20/2017    Physical Findings: AIMS: Facial and Oral Movements Muscles of Facial Expression: None, normal Lips and Perioral Area: None, normal Jaw: None, normal Tongue: None, normal,Extremity Movements Upper (arms, wrists, hands, fingers): None, normal Lower (legs, knees, ankles, toes): None, normal, Trunk Movements Neck, shoulders, hips: None, normal, Overall Severity Severity of abnormal movements (highest score from questions above): None, normal Incapacitation due to abnormal movements: None, normal Patient's awareness of abnormal movements (rate only patient's report): No Awareness, Dental Status Current problems with teeth and/or dentures?: No Does patient usually wear dentures?: No  CIWA:    COWS:     Musculoskeletal: Strength & Muscle Tone: within normal limits Gait & Station: normal Patient leans: N/A  Psychiatric Specialty Exam: Physical Exam  Nursing  note and vitals reviewed. Constitutional: She is oriented to person, place, and time. She appears well-developed and well-nourished.  HENT:  Head: Normocephalic and atraumatic.  Respiratory: Effort normal.  Neurological: She is alert and oriented to person, place, and time.    ROS  Blood pressure 129/80, pulse 79, temperature 98.4 F (36.9 C),  temperature source Oral, resp. rate 18, height  (1.753 m), weight 104.3 kg, SpO2 100 %.Body mass index is 33.97 kg/m.  General Appearance: Casual  Eye Contact:  Good  Speech:  Normal Rate  Volume:  Normal  Mood:  Anxious  Affect:  Congruent  Thought Process:  Coherent and Descriptions of Associations: Intact  Orientation:  Full (Time, Place, and Person)  Thought Content:  Logical  Suicidal Thoughts:  No  Homicidal Thoughts:  No  Memory:  Immediate;   Fair Recent;   Fair Remote;   Fair  Judgement:  Intact  Insight:  Fair  Psychomotor Activity:  Normal  Concentration:  Concentration: Fair and Attention Span: Fair  Recall:  [employer] of Knowledge:  Good  Language:  Good  Akathisia:  Negative  Handed:  Right  AIMS (if indicated):     Assets:  Desire for Improvement Resilience  ADL's:  Intact  Cognition:  WNL  Sleep:  Number of Hours: 6.5     Treatment Plan Summary: Daily contact with patient to assess and evaluate symptoms and progress in treatment, Medication management and Plan : Patient is seen and examined.  Patient is a 53 year old female with the above-stated past psychiatric history who is seen in follow-up.   Diagnosis: #1 bipolar disorder, most recently depressed, severe without psychotic features, #2neralized anxiety disorder  Patient is seen in follow-up.  She continues to slowly improve.  No evidence of active mania.  Her depression is improving.  She denied side effects from the changes in her medications.  She did complain of some back pain, and I have written for Flexeril as well as Tylenol and ibuprofen.  She does have an allergy to Naprosyn, but has taken ibuprofen in the past without difficulty.  No change in her current psychiatric medications.  If all continues to improve, we will plan on discharge tomorrow. 1.  Continue Abilify 10 mg p.o. daily for mood stability and depression. 2.  Continue Lexapro 5 mg p.o. daily for mood and anxiety. 3.  Continue  Lamictal 200 mg p.o. daily for mood stability. 4.  Continue lorazepam 1 mg p.o. every 8 hours as needed anxiety. 5.  Continue hydroxyzine 25 mg p.o. 3 times daily as needed anxiety. 6.  Flexeril 5 mg p.o. 3 times daily for back pain. 7.  Acetaminophen 650 mg p.o. every 6 hours as needed pain. 8.  Ibuprofen 400 mg p.o. every 6 hours as needed pain. 9. Disposition planning-in progress.  Antonieta Pert, MD 12/17/2018, 11:41 AM

## 2018-12-17 NOTE — Progress Notes (Signed)
Patient ID: SHAYLEA UCCI, female   DOB: [birth year], 53 y.o.   MRN: [id number]  Nursing Progress Note [id number]  Patient continues to ruminate about her home stressors and appears to have poor insight. Patient compliant with scheduled medications. Patient is seen attending groups and visible in the milieu. Patient currently denies SI/HI/AVH.   Patient is educated about and provided medication per provider's orders. Patient safety maintained with q15 min safety checks and low fall risk precautions. Emotional support given, 1:1 interaction, and active listening provided. Patient encouraged to attend meals, groups, and work on treatment plan and goals. Labs, vital signs and patient behavior monitored throughout shift. Patient encouraged to wear mask when in the milieu and is educated about coronavirus infection control precautions.  Patient refusing to wear mask on the unit but is compliant to wear mask off unit. Patient contracts for safety with staff. Patient remains safe on the unit at this time and agrees to come to staff with any issues/concerns. Patient is interacting with peers appropriately on the unit. Will continue to support and monitor.

## 2018-12-18 MED ORDER — CYCLOBENZAPRINE HCL 5 MG PO TABS: 5.0000 mg | ORAL_TABLET | Freq: Three times a day (TID) | ORAL | 0 refills | Status: DC

## 2018-12-18 MED ORDER — LAMOTRIGINE 200 MG PO TABS: 200.0000 mg | ORAL_TABLET | Freq: Every day | ORAL | 0 refills | Status: DC

## 2018-12-18 MED ORDER — ARIPIPRAZOLE 10 MG PO TABS: 10.0000 mg | ORAL_TABLET | Freq: Every day | ORAL | 0 refills | Status: DC

## 2018-12-18 MED ORDER — ESCITALOPRAM OXALATE 5 MG PO TABS: 5.0000 mg | ORAL_TABLET | Freq: Every day | ORAL | 0 refills | Status: DC

## 2018-12-18 NOTE — Progress Notes (Signed)
Pt discharged to lobby. Pt was stable and appreciative at that time. All papers and prescriptions were given and valuables returned. Verbal understanding expressed. Denies SI/HI and A/VH. Pt given opportunity to express concerns and ask questions.  

## 2018-12-18 NOTE — Progress Notes (Signed)
  [facility] Adult Case Management Discharge Plan :  Will you be returning to the same living situation after discharge:  Yes,  patient is returning home with her mother At discharge, do you have transportation home?: Yes,  patient's car is in parking lot Do you have the ability to pay for your medications: Yes,  BCBS, income from employment  Release of information consent forms completed and in the chart;  Patient's signature needed at discharge.  Patient to Follow up at: Follow-up Information    BEHAVIORAL HEALTH PARTIAL HOSPITALIZATION PROGRAM Follow up on 12/22/2018.   Specialty: Behavioral Health Why: Your CCA appointment is Monday 7/6 at 12:00p.  The appointments will be over WebEx and the counselor will send you a link.  Contact information: [place] [phone] [place] [phone]          Next level of care provider has access to [place] and Suicide Prevention discussed: Yes,  with the patient's mother     Has patient been referred to the Quitline?: N/A patient is not a smoker  Patient has been referred for addiction treatment: N/A  Marylee Floras, LCSWA 12/18/2018, 10:23 AM

## 2018-12-18 NOTE — Progress Notes (Signed)
D: Pt denies SI/HI/AVH. Pt is pleasant and cooperative. Pt stated she was doing ok. Pt visible in the dayroom A: Pt was offered support and encouragement. Pt was given scheduled medications. Pt was encourage to attend groups. Q 15 minute checks were done for safety.  R:Pt attends groups and interacts well with peers and staff. Pt is taking medication. Pt has no complaints.Pt receptive to treatment and safety maintained on unit.

## 2018-12-18 NOTE — BHH Suicide Risk Assessment (Signed)
[place] INPATIENT:  Family/Significant Other Suicide Prevention Education  Suicide Prevention Education:  Education Completed; with mother, Claudina Lick [phone]) has been identified by the patient as the family member/significant other with whom the patient will be residing, and identified as the person(s) who will aid the patient in the event of a mental health crisis (suicidal ideations/suicide attempt).  With written consent from the patient, the family member/significant other has been provided the following suicide prevention education, prior to the and/or following the discharge of the patient.  The suicide prevention education provided includes the following:  Suicide risk factors  Suicide prevention and interventions  National Suicide Hotline telephone number  [facility] assessment telephone number  [facility] Emergency Assistance [place] and/or Residential Mobile Crisis Unit telephone number  Request made of family/significant other to:  Remove weapons (e.g., guns, rifles, knives), all items previously/currently identified as safety concern.    Remove drugs/medications (over-the-counter, prescriptions, illicit drugs), all items previously/currently identified as a safety concern.  The family member/significant other verbalizes understanding of the suicide prevention education information provided.  The family member/significant other agrees to remove the items of safety concern listed above.   Hassan Rowan inquired about the patients follow up services. CSW informed the patient's mother that the patient was referred to [facility] PHP. The patient's mother expressed understanding and felt confident the patient would benefit from partial hospitalization programming. Hassan Rowan reports that she and the patient's son will pick the patient up.   CSW will continue to follow for a safe discharge.   Marylee Floras 12/18/2018, 9:08 AM

## 2018-12-18 NOTE — Discharge Summary (Signed)
Physician Discharge Summary Note  Patient:  Stefanie CritchleyKimberly D Waltner is an 53 y.o., female MRN:  [id number] DOB:  09/05/1965 Patient phone:  [phone] (home)  Patient address:   [address] [place] [id number],  Total Time spent with patient: 15 minutes  Date of Admission:  12/15/2018 Date of Discharge: 12/18/18  Reason for Admission:  suicidal ideation  Principal Problem: <principal problem not specified> Discharge Diagnoses: Active Problems:   Moderate bipolar I disorder, most recent episode depressed (HCC)   MDD (major depressive disorder), recurrent episode, severe (HCC)   Generalized anxiety disorder   Past Psychiatric History: Per admission H&P: History of several prior psychiatric admissions starting at around age 89 , most recently in 2019. History of Bipolar Disorder diagnosis. History of suicide attempts in the past by overdosing, most recently one year ago.Reports remote history of self cutting but not in many years. No history of psychosis. Endorses history of PTSD as above. No history of violence .  Past Medical History:  Past Medical History:  Diagnosis Date  . Anxiety   . Arthritis   . Depression 2.5 yrs ago   situational dep-no current tx/meds  . TBI (traumatic brain injury) (HCC) 11/10/2005    Past Surgical History:  Procedure Laterality Date  . BREAST REDUCTION SURGERY  02/25/2012   Procedure: MAMMARY REDUCTION  (BREAST);  Surgeon: Louisa SecondGerald Truesdale, MD;  Location: [facility];  Service: Plastics;  Laterality: Bilateral;  bilateral reduction  . CHOLECYSTECTOMY N/A 07/01/2018   Procedure: LAPAROSCOPIC CHOLECYSTECTOMY;  Surgeon: Sung AmabileSakai, Isami, DO;  Location: [facility] ORS;  Service: General;  Laterality: N/A;  . DILATION AND CURETTAGE OF UTERUS  2001  . TEMPOROMANDIBULAR JOINT SURGERY  1984; 1996   X 2; bilateral  . TUBAL LIGATION  2003   Family History:  Family History  Problem Relation Age of Onset  . Depression Father   . Anxiety disorder  Brother   . Depression Maternal Grandmother    Family Psychiatric  History:  Father and maternal grandmother with history of depression and brother has history of anxiety.  No history of suicides in family. No history of substance abuse or alcohol abuse in family Social History:  Social History   Substance and Sexual Activity  Alcohol Use Not Currently     Social History   Substance and Sexual Activity  Drug Use No    Social History   Socioeconomic History  . Marital status: Divorced    Spouse name: Not on file  . Number of children: Not on file  . Years of education: Not on file  . Highest education level: Not on file  Occupational History  . Not on file  Social Needs  . Financial resource strain: Not on file  . Food insecurity    Worry: Not on file    Inability: Not on file  . Transportation needs    Medical: Not on file    Non-medical: Not on file  Tobacco Use  . Smoking status: Never Smoker  . Smokeless tobacco: Never Used  Substance and Sexual Activity  . Alcohol use: Not Currently  . Drug use: No  . Sexual activity: Not Currently  Lifestyle  . Physical activity    Days per week: Not on file    Minutes per session: Not on file  . Stress: Not on file  Relationships  . Social Musicianconnections    Talks on phone: Not on file    Gets together: Not on file    Attends religious  service: Not on file    Active member of club or organization: Not on file    Attends meetings of clubs or organizations: Not on file    Relationship status: Not on file  Other Topics Concern  . Not on file  Social History Narrative  . Not on file    Hospital Course:  From admission H&P: 53 year old female, reports history of Bipolar Disorder. Presented to hospital at the encouragement of her outpatient psychiatrist, Dr. Adele Schilder, whom she saw via tele-psychiatry earlier today for a scheduled appointment. States " I told him I have not been feeling well" and endorsed recent suicidal  ideations, without plan or intent. She reports she called a suicide hotline a few days ago, but states  " they actually put me on hold". She reports she feels she has been worsening over the last 2-3 weeks. Describes chronic stressors- stressful job, strained relationship with her mother, with whom she lives . States " I feel like she is always putting me down, berates me". Endorses neuro-vegetative symptoms as below. Of note, also endorses a subjective sense of racing thoughts and has noticed " I have been talking too fast". She reports she has been compliant with her prescribed medications ( lamotrigine , lexapro, hydroxyizine).  Ms. Badertscher was admitted for depression with suicidal ideation. She presented with mixed symptoms- racing thoughts, irritability, pressured speech along with depression and anhedonia. Lexapro was decreased. Abilify was started. Lamictal was continued. She responded well to treatment with no adverse effects reported. She remained on the [facility] unit for three days. She is discharging on the medications listed below. She has shown improvement with improved mood, affect, sleep, appetite, and interaction. She denies any SI/HI/AVH and contracts for safety. Collateral information was obtained from her mother, who denied safety concerns for discharge. Patient agrees to continue treatment with the Partial Hospitalization Program (see below). Patient is provided with prescriptions for medications upon discharge. She is discharging home with her mother.  Physical Findings: AIMS: Facial and Oral Movements Muscles of Facial Expression: None, normal Lips and Perioral Area: None, normal Jaw: None, normal Tongue: None, normal,Extremity Movements Upper (arms, wrists, hands, fingers): None, normal Lower (legs, knees, ankles, toes): None, normal, Trunk Movements Neck, shoulders, hips: None, normal, Overall Severity Severity of abnormal movements (highest score from questions above): None,  normal Incapacitation due to abnormal movements: None, normal Patient's awareness of abnormal movements (rate only patient's report): No Awareness, Dental Status Current problems with teeth and/or dentures?: No Does patient usually wear dentures?: No  CIWA:    COWS:     Musculoskeletal: Strength & Muscle Tone: within normal limits Gait & Station: normal Patient leans: N/A  Psychiatric Specialty Exam: Physical Exam  Nursing note and vitals reviewed. Constitutional: She is oriented to person, place, and time. She appears well-developed and well-nourished.  Cardiovascular: Normal rate.  Respiratory: Effort normal.  Neurological: She is alert and oriented to person, place, and time.    Review of Systems  Constitutional: Negative.   Psychiatric/Behavioral: Positive for depression (stable on medication). Negative for hallucinations, substance abuse and suicidal ideas. The patient is not nervous/anxious and does not have insomnia.     Blood pressure 126/87, pulse 71, temperature 98.4 F (36.9 C), temperature source Oral, resp. rate 18, height 5\' 9"  (1.753 m), weight 104.3 kg, SpO2 100 %.Body mass index is 33.97 kg/m.  See MD's discharge SRA      Has this patient used any form of tobacco in the last 30  days? (Cigarettes, Smokeless Tobacco, Cigars, and/or Pipes)  No  Blood Alcohol level:  Lab Results  Component Value Date   ETH <10 09/22/2017   ETH  09/12/2008    <5        LOWEST DETECTABLE LIMIT FOR SERUM ALCOHOL IS 5 mg/dL FOR MEDICAL PURPOSES ONLY    Metabolic Disorder Labs:  Lab Results  Component Value Date   HGBA1C 5.4 12/16/2018   MPG 108.28 12/16/2018   MPG 93.93 03/19/2017   No results found for: PROLACTIN Lab Results  Component Value Date   CHOL 272 (H) 12/16/2018   TRIG 156 (H) 12/16/2018   HDL 42 12/16/2018   CHOLHDL 6.5 12/16/2018   VLDL 31 12/16/2018   LDLCALC 199 (H) 12/16/2018   LDLCALC 179 (H) 03/20/2017    See Psychiatric Specialty Exam and  Suicide Risk Assessment completed by Attending Physician prior to discharge.  Discharge destination:  Home  Is patient on multiple antipsychotic therapies at discharge:  No   Has Patient had three or more failed trials of antipsychotic monotherapy by history:  No  Recommended Plan for Multiple Antipsychotic Therapies: NA  Discharge Instructions    Discharge instructions   Complete by: As directed    Patient is instructed to take all prescribed medications as recommended. Report any side effects or adverse reactions to your outpatient psychiatrist. Patient is instructed to abstain from alcohol and illegal drugs while on prescription medications. In the event of worsening symptoms, patient is instructed to call the crisis hotline, 911, or go to the nearest emergency department for evaluation and treatment.     Allergies as of 12/18/2018      Reactions   Anaprox [naproxen Sodium] Anaphylaxis, Shortness Of Breath   Heart palpitations, shortness of breath, generalized swelling      Medication List    STOP taking these medications   augmented betamethasone dipropionate 0.05 % cream Commonly known as: DIPROLENE-AF   docusate sodium 100 MG capsule Commonly known as: COLACE     TAKE these medications     Indication  ARIPiprazole 10 MG tablet Commonly known as: ABILIFY Take 1 tablet (10 mg total) by mouth daily. Start taking on: December 19, 2018 What changed:   medication strength  how much to take  additional instructions  Indication: Mood   cyclobenzaprine 5 MG tablet Commonly known as: FLEXERIL Take 1 tablet (5 mg total) by mouth 3 (three) times daily.  Indication: Muscle Spasm   escitalopram 5 MG tablet Commonly known as: LEXAPRO Take 1 tablet (5 mg total) by mouth daily. Start taking on: December 19, 2018 What changed:   medication strength  how much to take  when to take this  additional instructions  Indication: Major Depressive Disorder   lamoTRIgine 200 MG  tablet Commonly known as: LAMICTAL Take 1 tablet (200 mg total) by mouth daily.  Indication: Mood stabilization      Follow-up Information    BEHAVIORAL HEALTH PARTIAL HOSPITALIZATION PROGRAM Follow up on 12/22/2018.   Specialty: Behavioral Health Why: Your CCA appointment is Monday 7/6 at 12:00p.  The appointments will be over WebEx and the counselor will send you a link.  Contact information: [address] [id number] [place] [id number] [phone]          Follow-up recommendations: Activity as tolerated. Diet as recommended by primary care physician. Keep all scheduled follow-up appointments as recommended.   Comments:   Patient is instructed to take all prescribed medications as recommended. Report  any side effects or adverse reactions to your outpatient psychiatrist. Patient is instructed to abstain from alcohol and illegal drugs while on prescription medications. In the event of worsening symptoms, patient is instructed to call the crisis hotline, 911, or go to the nearest emergency department for evaluation and treatment.  Signed: Aldean BakerJanet E Lequita Meadowcroft, NP 12/18/2018, 10:15 AM

## 2018-12-18 NOTE — BHH Suicide Risk Assessment (Signed)
[facility] Discharge Suicide Risk Assessment   Principal Problem: <principal problem not specified> Discharge Diagnoses: Active Problems:   Moderate bipolar I disorder, most recent episode depressed ([place])   MDD (major depressive disorder), recurrent episode, severe ([place])   Generalized anxiety disorder   Total Time spent with patient: 15 minutes  Musculoskeletal: Strength & Muscle Tone: within normal limits Gait & Station: normal Patient leans: N/A  Psychiatric Specialty Exam: Review of Systems  Musculoskeletal: Positive for neck pain.  All other systems reviewed and are negative.   Blood pressure 126/87, pulse 71, temperature 98.4 F (36.9 C), temperature source Oral, resp. rate 18, height 5\' 9"  (1.753 m), weight 104.3 kg, SpO2 100 %.Body mass index is 33.97 kg/m.  General Appearance: Casual  Eye Contact::  Good  Speech:  Normal Rate409  Volume:  Normal  Mood:  Anxious  Affect:  Congruent  Thought Process:  Coherent and Descriptions of Associations: Intact  Orientation:  Full (Time, Place, and Person)  Thought Content:  Logical  Suicidal Thoughts:  No  Homicidal Thoughts:  No  Memory:  Immediate;   Fair Recent;   Fair Remote;   Fair  Judgement:  Intact  Insight:  Fair  Psychomotor Activity:  Increased  Concentration:  Fair  Recall:  [employer] of Knowledge:Good  Language: Good  Akathisia:  Negative  Handed:  Right  AIMS (if indicated):     Assets:  Communication Skills Desire for Improvement Housing Resilience  Sleep:  Number of Hours: 6.75  Cognition: WNL  ADL's:  Intact   Mental Status Per Nursing Assessment::   On Admission:  Suicidal ideation indicated by patient, Self-harm behaviors, Thoughts of violence towards others, Self-harm thoughts  Demographic Factors:  Divorced or widowed and Caucasian  Loss Factors: Financial problems/change in socioeconomic status  Historical Factors: Impulsivity  Risk Reduction Factors:   Living with another person,  especially a relative, Positive therapeutic relationship and Positive coping skills or problem solving skills  Continued Clinical Symptoms:  Bipolar Disorder:   Depressive phase  Cognitive Features That Contribute To Risk:  None    Suicide Risk:  Minimal: No identifiable suicidal ideation.  Patients presenting with no risk factors but with morbid ruminations; may be classified as minimal risk based on the severity of the depressive symptoms  Follow-up Information    [place] Follow up on 12/22/2018.   Specialty: Behavioral Health Why: Your CCA appointment is Monday 7/6 at 12:00p.  The appointments will be over WebEx and the counselor will send you a link.  Contact information: [place] [id number] [place] [phone]          Plan Of Care/Follow-up recommendations:  Activity:  ad lib  Sharma Covert, MD 12/18/2018, 8:56 AM

## 2018-12-22 ENCOUNTER — Other Ambulatory Visit (HOSPITAL_COMMUNITY): Payer: BC Managed Care – PPO | Attending: Orthopedic Surgery | Admitting: Licensed Clinical Social Worker

## 2018-12-22 ENCOUNTER — Other Ambulatory Visit: Payer: Self-pay

## 2018-12-22 DIAGNOSIS — Z79899 Other long term (current) drug therapy: Secondary | ICD-10-CM | POA: Diagnosis not present

## 2018-12-22 DIAGNOSIS — F314 Bipolar disorder, current episode depressed, severe, without psychotic features: Secondary | ICD-10-CM | POA: Insufficient documentation

## 2018-12-22 DIAGNOSIS — M1990 Unspecified osteoarthritis, unspecified site: Secondary | ICD-10-CM | POA: Diagnosis not present

## 2018-12-22 DIAGNOSIS — M549 Dorsalgia, unspecified: Secondary | ICD-10-CM | POA: Insufficient documentation

## 2018-12-22 DIAGNOSIS — R4589 Other symptoms and signs involving emotional state: Secondary | ICD-10-CM | POA: Diagnosis not present

## 2018-12-22 DIAGNOSIS — Z87820 Personal history of traumatic brain injury: Secondary | ICD-10-CM | POA: Diagnosis not present

## 2018-12-22 DIAGNOSIS — R45851 Suicidal ideations: Secondary | ICD-10-CM | POA: Diagnosis not present
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
## 2018-12-23 ENCOUNTER — Other Ambulatory Visit: Payer: Self-pay

## 2018-12-23 ENCOUNTER — Other Ambulatory Visit (HOSPITAL_COMMUNITY): Payer: BC Managed Care – PPO | Admitting: Licensed Clinical Social Worker

## 2018-12-23 DIAGNOSIS — F314 Bipolar disorder, current episode depressed, severe, without psychotic features: Secondary | ICD-10-CM

## 2018-12-23 NOTE — Progress Notes (Signed)
Virtual Visit via Video Note  I connected with Stefanie Galvan on 12/23/18 at  9:00 AM EDT by a video enabled telemedicine application and verified that I am speaking with the correct person using two identifiers.   I discussed the limitations of evaluation and management by telemedicine and the availability of in person appointments. The patient expressed understanding and agreed to proceed.   I discussed the assessment and treatment plan with the patient. The patient was provided an opportunity to ask questions and all were answered. The patient agreed with the plan and demonstrated an understanding of the instructions.   The patient was advised to call back or seek an in-person evaluation if the symptoms worsen or if the condition fails to improve as anticipated.  I provided 15 minutes of non-face-to-face time during this encounter.   [name], NP   Behavioral Health Partial Program Assessment Note  Date: 12/23/2018 Name: Stefanie Galvan MRN: [id number]   HPI: Stefanie Galvan  is a 53 y.o. Caucasian female presents with worsening depression and increased anxiety related to multiple stressors.  Patient recently discharged from inpatient admission due to suicidal ideations.  Patient reports she is currently followed by Dr. Adele Galvan with diagnosis of bipolar, depression and anxiety.  Reports feeling distraught as she has not been able to follow-up with the therapist since 1 year prior as she was followed by Stefanie Galvan.  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  Patient reports the lack of "consistency "is stressing her out.  Reports she is recently applied for different positions.  States she currently resides with her mother and brother and has noticed unfair treatment by her mother.  Patient reports favoritism towards her brother.  Which causes her to feel  sad and depressed.  Stefanie Galvan reports financially she is unable to live on her own. She stated she has been divorce for the past 5+ years with 3 children.  Reports a good relationship between her children.  Stated her children currently reside throughout [place] and/or [place] and appear to be doing well.  States her youngest son who was 36 recently had a DUI.  However she has been supportive throughout this time for him.  Patient was enrolled in partial psychiatric program on 12/23/18.  Primary complaints include: agitation, problem with medication, relationship difficulties and stressed at work.  Onset of symptoms was gradual with gradually worsening course since that time. Psychosocial Stressors include the following: family, financial and occupational.   I have reviewed the following documentation dated past psychiatric history, past medical history, past social and family history and past Review of systems  Complaints of Pain: back pain   Past Psychiatric History: Previous inpatient admissions.  Diagnosis of bipolar depression and anxiety.  History of self-injurious behaviors. First psychiatric contact , Past psychiatric hospitalizations and Previous suicide attempts multiple  Currently in treatment with: Lamictal 200, Lexapro 5 and Abilify 10  Substance Abuse History: none Use of Alcohol: denied Use of Caffeine: denies use Use of over the counter:   Past Surgical History:  Procedure Laterality Date  . BREAST REDUCTION SURGERY  02/25/2012   Procedure: MAMMARY REDUCTION  (BREAST);  Surgeon: Stefanie Polio, MD;  Location: [place];  Service: Plastics;  Laterality: Bilateral;  bilateral reduction  . CHOLECYSTECTOMY N/A 07/01/2018   Procedure: LAPAROSCOPIC CHOLECYSTECTOMY;  Surgeon: Stefanie Sprague, DO;  Location: [facility];  Service: General;  Laterality: N/A;  . DILATION AND CURETTAGE OF UTERUS  2001  . TEMPOROMANDIBULAR JOINT SURGERY  1984; 1996   X 2; bilateral  .  TUBAL LIGATION  2003    Past Medical History:  Diagnosis Date  . Anxiety   . Arthritis   . Depression 2.5 yrs ago   situational dep-no current tx/meds  . TBI (traumatic brain injury) (HCC) 11/10/2005   Outpatient Encounter Medications as of 12/23/2018  Medication Sig  . ARIPiprazole (ABILIFY) 10 MG tablet Take 1 tablet (10 mg total) by mouth daily.  . cyclobenzaprine (FLEXERIL) 5 MG tablet Take 1 tablet (5 mg total) by mouth 3 (three) times daily.  Marland Kitchen. escitalopram (LEXAPRO) 5 MG tablet Take 1 tablet (5 mg total) by mouth daily.  Marland Kitchen. lamoTRIgine (LAMICTAL) 200 MG tablet Take 1 tablet (200 mg total) by mouth daily.   No facility-administered encounter medications on file as of 12/23/2018.    Allergies  Allergen Reactions  . Anaprox [Naproxen Sodium] Anaphylaxis and Shortness Of Breath    Heart palpitations, shortness of breath, generalized swelling    Social History   Tobacco Use  . Smoking status: Never Smoker  . Smokeless tobacco: Never Used  Substance Use Topics  . Alcohol use: Not Currently   Functioning Relationships: strained with co-workers, good relationship with children and poor relationship with parents Education: Other  Other Pertinent History: None Family History  Problem Relation Age of Onset  . Depression Father   . Anxiety disorder Brother   . Depression Maternal Grandmother      Review of Systems Constitutional: negative  Objective:  There were no vitals filed for this visit.  Physical Exam:   Mental Status Exam: Appearance:  N/A poor quality video Psychomotor::  N/A Attention span and concentration: Normal Behavior: calm, cooperative and adequate rapport can be established Speech:  normal pitch Mood:  depressed and anxious Affect:  normal Thought Process:  Coherent Thought Content:  WDL Orientation:  person, place and time/date Cognition:  grossly intact Insight:  Fair Judgment:  Intact Estimate of Intelligence: Average Fund of  knowledge: Aware of current events Memory: Recent and remote intact Abnormal movements: None Gait and station: Normal  Assessment:  Diagnosis: Bipolar 1 disorder, depressed, severe (HCC) [F31.4] 1. Bipolar 1 disorder, depressed, severe (HCC)     Indications for admission: inpatient care required if not in partial hospital program  Plan: Orders placed for OT Occupational Therapy patient enrolled in Partial Hospitalization Program, patient's current medications are to be continued, a comprehensive treatment plan will be developed and side effects of medications have been reviewed with patient  Treatment options and alternatives reviewed with patient and patient understands the above plan.  Treatment plan was reviewed and agreed upon by NPT Stefanie Galvan and patient Stefanie HodgkinsKimberly Galvan need for group services.    Oneta Rackanika N Cheril Slattery, NP

## 2018-12-24 ENCOUNTER — Encounter (HOSPITAL_COMMUNITY): Payer: Self-pay

## 2018-12-24 ENCOUNTER — Other Ambulatory Visit (HOSPITAL_COMMUNITY): Payer: BC Managed Care – PPO | Admitting: Licensed Clinical Social Worker

## 2018-12-24 ENCOUNTER — Other Ambulatory Visit: Payer: Self-pay

## 2018-12-24 ENCOUNTER — Other Ambulatory Visit (HOSPITAL_COMMUNITY): Payer: BC Managed Care – PPO | Admitting: Specialist

## 2018-12-24 ENCOUNTER — Encounter (HOSPITAL_COMMUNITY): Payer: Self-pay | Admitting: Family

## 2018-12-24 DIAGNOSIS — R4589 Other symptoms and signs involving emotional state: Secondary | ICD-10-CM

## 2018-12-24 DIAGNOSIS — F314 Bipolar disorder, current episode depressed, severe, without psychotic features: Secondary | ICD-10-CM

## 2018-12-24 DIAGNOSIS — F332 Major depressive disorder, recurrent severe without psychotic features: Secondary | ICD-10-CM

## 2018-12-24 NOTE — Progress Notes (Signed)
Spoke with patient via Webex video call, used 2 identifiers to correctly identify patient. She explained that she was recently inpatient at [facility] and was recommended to start PHP. She had been depressed /suicidal, sleeping a lot, crying, and very irritable. She completed PHP about a year ago. Group is going well for her and feels they are very beneficial because it reminds her that she/s not alone in the way she feels. No side effects from medications and believes they are working well and keeping her stable. Denies SI/HI and auditory hallucinations. Admits to seeing bugs and animals at times but knows they are not real. Has stopped taking the Flexeril that was prescribed in the hospital because her back is no longer hurting. She states the hospital bed was causing her back pain. On scale of 1-10 as 10 being worst she rates depression at a 3 and anxiety at a 7. Was feeling more anxious this morning but it has eased up as the day went along. PHQ9 depression screening is 20. No issues or complaints and looks forward to continuing with groups.

## 2018-12-24 NOTE — Progress Notes (Addendum)
GROUP NOTE - spiritual care group 12/24/2018 11:00 - 12:00 ?Facilitated by Simone Curia, MDiv, B[facility].  ? ? Group focused on topic of strength. ?Group members reflected on what thoughts and feelings emerge when they hear this topic. ?They then engaged in facilitated dialog around how strength is present in their lives. This dialog focused on representing what strength had been to them in their lives (images and patterns given) and what they saw as helpful in their life now (what they needed / wanted). ? ? Activity drew on narrative framework   Stefanie Galvan was present throughout group.  Noted she needs strength to be self - affirmation and self - value.  Described taking space to recognize she is important / more important than making everyone else feel well.  [name] showed the group paintings she had painted after discharging from partial last time.  Described strength as accepting past and being open to newness.

## 2018-12-24 NOTE — Therapy (Addendum)
[facility] [address] [place], [place], [address] Phone: [phone]   Fax:  [phone] Virtual Visit via Video Note  I connected with Stefanie Galvan on 12/818 at  830 am EDT by a video enabled telemedicine application and verified that I am speaking with the correct person using two identifiers.   I discussed the limitations of evaluation and management by telemedicine and the availability of in person appointments. The patient expressed understanding and agreed to proceed.    I discussed the assessment and treatment plan with the patient. The patient was provided an opportunity to ask questions and all were answered. The patient agreed with the plan and demonstrated an understanding of the instructions.   The patient was advised to call back or seek an in-person evaluation if the symptoms worsen or if the condition fails to improve as anticipated.  I provided 40 minutes of non-face-to-face time during this encounter.      Occupational Therapy Evaluation  Patient Details  Name: Stefanie Galvan MRN: [id number] Date of Birth: 09/05/1965 Referring Provider (OT): Hillery Jacksanika Lewis   Encounter Date: 12/24/2018  OT End of Session - 12/24/18 0923    Visit Number  1    Number of Visits  12    Date for OT Re-Evaluation  01/14/19    Authorization Type  Medcost    OT Start Time  0830    OT Stop Time  0910    OT Time Calculation (min)  40 min    Activity Tolerance  Patient tolerated treatment well    Behavior During Therapy  [facility] for tasks assessed/performed       Past Medical History:  Diagnosis Date  . Anxiety   . Arthritis   . Depression 2.5 yrs ago   situational dep-no current tx/meds  . TBI (traumatic brain injury) (HCC) 11/10/2005    Past Surgical History:  Procedure Laterality Date  . BREAST REDUCTION SURGERY  02/25/2012   Procedure: MAMMARY REDUCTION  (BREAST);  Surgeon: Louisa SecondGerald Truesdale, MD;   Location: [facility];  Service: Plastics;  Laterality: Bilateral;  bilateral reduction  . CHOLECYSTECTOMY N/A 07/01/2018   Procedure: LAPAROSCOPIC CHOLECYSTECTOMY;  Surgeon: Sung AmabileSakai, Isami, DO;  Location: [facility];  Service: General;  Laterality: N/A;  . DILATION AND CURETTAGE OF UTERUS  2001  . TEMPOROMANDIBULAR JOINT SURGERY  1984; 1996   X 2; bilateral  . TUBAL LIGATION  2003    There were no vitals filed for this visit.  Subjective Assessment - 12/24/18 0923    Subjective   S:  I am most proud of my children    Currently in Pain?  No/denies        [facility] - 12/24/18 0001      Assessment   Medical Diagnosis  Bipolar Disorder    Referring Provider (OT)  H[place] Jacksanika Lewis    Onset Date/Surgical Date  12/15/18      Precautions   Precautions  None      Restrictions   Weight Bearing Restrictions  No      Balance Screen   Has the patient fallen in the past 6 months  No    Has the patient had a decrease in activity level because of a fear of falling?   No    Is the patient reluctant to leave their home because of a fear of falling?   No         OT assessment Diagnosis:  Bipolar Disorder Past medical history/referral information:  Bipolar disorder, [facility] for suicidal ideations.  Has been a participant in PHP in the past ( 2018) Living situation:  Lives with mom and brother.  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   3 Need for minimal intervention/consultative participation  x 2 Need for OT intervention indicated to restore/improve participation   1 Need for extensive OT intervention indicated to improve participation.  Referral for follow up services also recommended.   Assessment:  Patient demonstrates behavior that inhibits participation in occupation.  Patient will benefit from occupational therapy intervention in order to improve time management, financial management, stress management, job readiness skills, social skills, and health management skills in preparation to return to full time community living and to be a productive community member.   Plan:  Patient will participate in skilled occupational therapy sessions individually or in a group setting to improve coping skills, psychosocial skills, and emotional skills required to return to prior level of function. Treatment will be 4-5 times per week for 3 weeks.                   OT Short Term Goals - 12/24/18 0925      OT SHORT TERM GOAL #1   Title  Pt will be educated on strategies to improve psychosocial skills needed to participate fully in all daily, work, and leisure activities    Time  3    Period  Weeks    Status  New    Target Date  01/14/19      OT SHORT TERM GOAL #2   Title  Pt will apply psychosocial skills and coping mechanisms to daily activities in order to function independently and reintegrate into community    Time  3    Period  Weeks    Status  New      OT SHORT TERM  GOAL #3   Title  Pt will engage in goal setting to improve functional BADL/IADL participation and routine prior to reintegrating into community    Time  3    Period  Weeks    Status  New      OT SHORT TERM GOAL #4   Title  Pt will recall and/or identify 1-3 meaningful job opportunities prior to  reintegrating into communtiy    Time  3    Period  Weeks    Status  New      OT SHORT TERM GOAL #5   Title  Pt will identify potential support system opportunities in the community and improve comfort level in reaching out to idenitifed supports, as needed.    Time  3    Period  Weeks    Status  New               Plan - 12/24/18 6073    OT Occupational Profile and History  Problem Focused Assessment - Including review of records relating to presenting problem    Occupational performance deficits (Please refer to evaluation for details):  ADL's;Work;Social Participation    Cognitive Skills  Emotional;Problem Solve    Psychosocial Skills  Coping Strategies;Environmental  Adaptations;Habits;Interpersonal Interaction;Routines and Behaviors    Rehab Potential  Good    Clinical Decision Making  Limited treatment options, no task modification necessary    Comorbidities Affecting Occupational Performance:  None    Modification or Assistance to Complete Evaluation   No modification of tasks or assist necessary to complete eval    OT Frequency  3x / week    OT Duration  --   3 weeks   OT Treatment/Interventions  Self-care/ADL training;Psychosocial skills training;Coping strategies training;Cognitive remediation/compensation;Patient/family education    Consulted and Agree with Plan of Care  Patient       Patient will benefit from skilled therapeutic intervention in order to improve the following deficits and impairments:     Cognitive Skills: Emotional, Problem Solve Psychosocial Skills: Coping Strategies, Environmental  Adaptations, Habits, Interpersonal Interaction, Routines and  Behaviors   Visit Diagnosis: 1. Severe episode of recurrent major depressive disorder, without psychotic features ([place])   2. Difficulty coping       Problem List Patient Active Problem List   Diagnosis Date Noted  . Bipolar 1 disorder, depressed, severe ([place]) 12/22/2018  . Generalized anxiety disorder   . MDD (major depressive disorder), recurrent episode, severe ([place]) 12/15/2018  . Biliary colic 71/11/2692  . Bipolar affective ([place]) 09/24/2017  . Lithium overdose 09/23/2017  . Adjustment disorder with mixed disturbance of emotions and conduct   . Lithium overdose, intentional self-harm, initial encounter ([place]) 09/22/2017  . QT prolongation 09/22/2017  . Moderate bipolar I disorder, most recent episode depressed ([place]) 03/19/2017  . Bipolar 2 disorder, major depressive episode ([place]) 03/04/2017    Class: Chronic  . Chronic post-traumatic stress disorder (PTSD) 03/04/2017    Class: Chronic  . DDD (degenerative disc disease), lumbar 03/04/2017  . Arthritis 08/29/2016    Vangie Bicker, [place], OTR/L [phone]  12/24/2018, 9:32 AM  [place], [place], [address] Phone: [phone]   Fax:  [phone]  Name: Stefanie Galvan MRN: [id number] Date of Birth: March 09, 1966

## 2018-12-25 ENCOUNTER — Encounter (HOSPITAL_COMMUNITY): Payer: Self-pay

## 2018-12-25 ENCOUNTER — Other Ambulatory Visit (HOSPITAL_COMMUNITY): Payer: BC Managed Care – PPO | Admitting: Specialist

## 2018-12-25 ENCOUNTER — Other Ambulatory Visit: Payer: Self-pay

## 2018-12-25 ENCOUNTER — Other Ambulatory Visit (HOSPITAL_COMMUNITY): Payer: BC Managed Care – PPO | Admitting: Licensed Clinical Social Worker

## 2018-12-25 DIAGNOSIS — F314 Bipolar disorder, current episode depressed, severe, without psychotic features: Secondary | ICD-10-CM

## 2018-12-25 DIAGNOSIS — R4589 Other symptoms and signs involving emotional state: Secondary | ICD-10-CM

## 2018-12-25 DIAGNOSIS — F332 Major depressive disorder, recurrent severe without psychotic features: Secondary | ICD-10-CM

## 2018-12-25 NOTE — Therapy (Signed)
Virtual Visit via Video Note  I connected with Stefanie Galvan on 12/25/18 at  1100 AM EDT by a video enabled telemedicine application and verified that I am speaking with the correct person using two identifiers.   I discussed the limitations of evaluation and management by telemedicine and the availability of in person appointments. The patient expressed understanding and agreed to proceed.    I discussed the assessment and treatment plan with the patient. The patient was provided an opportunity to ask questions and all were answered. The patient agreed with the plan and demonstrated an understanding of the instructions.   The patient was advised to call back or seek an in-person evaluation if the symptoms worsen or if the condition fails to improve as anticipated.  I provided 60 minutes of non-face-to-face time during this encounter.   Stefanie Galvan, [place] [address] [place], [place], [address] Phone: [phone]   Fax:  [phone]  Occupational Therapy Treatment  Patient Details  Name: Stefanie Galvan MRN: [id number] Date of Birth: Aug 25, 1965 Referring Provider (OT): Ricky Ala   Encounter Date: 12/25/2018  OT End of Session - 12/25/18 1520    Visit Number  2    Number of Visits  12    Date for OT Re-Evaluation  01/14/19    Authorization Type  Medcost    OT Start Time  1100    OT Stop Time  1200    OT Time Calculation (min)  60 min    Activity Tolerance  Patient tolerated treatment well    Behavior During Therapy  Stefanie Galvan for tasks assessed/performed       Past Medical History:  Diagnosis Date  . Anxiety   . Arthritis   . Depression 2.5 yrs ago   situational dep-no current tx/meds  . TBI (traumatic brain injury) ([place]) 11/10/2005    Past Surgical History:  Procedure Laterality Date  . BREAST REDUCTION SURGERY  02/25/2012   Procedure: MAMMARY REDUCTION  (BREAST);  Surgeon:  Cristine Polio, MD;  Location: [facility];  Service: Plastics;  Laterality: Bilateral;  bilateral reduction  . CHOLECYSTECTOMY N/A 07/01/2018   Procedure: LAPAROSCOPIC CHOLECYSTECTOMY;  Surgeon: Benjamine Sprague, DO;  Location: [facility];  Service: General;  Laterality: N/A;  . DILATION AND CURETTAGE OF UTERUS  2001  . [place]; 1996   X 2; bilateral  . TUBAL LIGATION  2003    There were no vitals filed for this visit.  Subjective Assessment - 12/25/18 1520    Pain Score  0-No pain       S: I had 2 scenarios that happened yesterday that I was able to assert myself. O:  Patient was highly engaged in group throughout session. Group focused on assertive communication skills, including verbal and nonverbal traits of assertive communication, the purpose of assertive communication, and reasons we often avoid assertive communication.  Group was educated on strategies to improve assertive thinking including though diaries and behavioral experiments.  Members had an opportunity to provide solutions to each step of these strategies.  Group was educated on behaving assertively including basic assertion, empathetic assertion, consequence assertion, negative assertion, and broken record.  Members were able to explore situations in which each type of assertion was most beneficial. A: Stefanie Galvan was highy engaged  in session.  She was able to participate in scenarios and supply real life examples of appropriate assertive responses to scenarios. P:  Continue OT group 3 times per week, next group session to explore job finding resources.                         OT Education - 12/25/18 1520    Education Details  patient provided education on assertive thinking and assertive behaviors    Person(s) Educated  Patient    Methods  Explanation;Handout    Comprehension  Verbalized understanding       OT Short Term Goals - 12/25/18 1521      OT SHORT TERM  GOAL #1   Title  Pt will be educated on strategies to improve psychosocial skills needed to participate fully in all daily, work, and leisure activities    Time  3    Period  Weeks    Status  On-going    Target Date  01/14/19      OT SHORT TERM GOAL #2   Title  Pt will apply psychosocial skills and coping mechanisms to daily activities in order to function independently and reintegrate into community    Time  3    Period  Weeks    Status  On-going      OT SHORT TERM GOAL #3   Title  Pt will engage in goal setting to improve functional BADL/IADL participation and routine prior to reintegrating into community    Time  3    Period  Weeks    Status  On-going      OT SHORT TERM GOAL #4   Title  Pt will recall and/or identify 1-3 meaningful job opportunities prior to reintegrating into communtiy    Time  3    Period  Weeks    Status  On-going      OT SHORT TERM GOAL #5   Title  Pt will identify potential support system opportunities in the community and improve comfort level in reaching out to idenitifed supports, as needed.    Time  3    Period  Weeks    Status  On-going               Plan - 12/25/18 1521    Cognitive Skills  Emotional;Problem Solve    Psychosocial Skills  Coping Strategies;Environmental  Adaptations;Habits;Interpersonal Interaction;Routines and Behaviors       Patient will benefit from skilled therapeutic intervention in order to improve the following deficits and impairments:     Cognitive Skills: Emotional, Problem Solve Psychosocial Skills: Coping Strategies, Environmental  Adaptations, Habits, Interpersonal Interaction, Routines and Behaviors   Visit Diagnosis: 1. Difficulty coping   2. Severe episode of recurrent major depressive disorder, without psychotic features [facility](HCC)       Problem List Patient Active Problem List   Diagnosis Date Noted  . Bipolar 1 disorder, depressed, severe (HCC) 12/22/2018  . Generalized anxiety disorder   .  MDD (major depressive disorder), recurrent episode, severe (HCC) 12/15/2018  . Biliary colic 07/01/2018  . Bipolar affective (HCC) 09/24/2017  . Lithium overdose 09/23/2017  . Adjustment disorder with mixed disturbance of emotions and conduct   . Lithium overdose, intentional self-harm, initial encounter (HCC) 09/22/2017  . QT prolongation 09/22/2017  . Moderate bipolar I disorder, most recent episode depressed (HCC) 03/19/2017  . Bipolar 2 disorder, major depressive episode (HCC) 03/04/2017    Class: Chronic  . Chronic post-traumatic stress disorder (PTSD) 03/04/2017    Class: Chronic  . DDD (degenerative disc disease), lumbar 03/04/2017  . Arthritis 08/29/2016  Shirlean MylarBethany H. Analee Montee, MHA, OTR/L [phone]  12/25/2018, 3:22 PM  [facility] [address] [place], [place], [address] Phone: [phone]   Fax:  [phone]  Name: Stefanie Galvan MRN: [id number] Date of Birth: 09/02/1965

## 2018-12-26 ENCOUNTER — Other Ambulatory Visit (HOSPITAL_COMMUNITY): Payer: BC Managed Care – PPO | Admitting: Specialist

## 2018-12-26 ENCOUNTER — Other Ambulatory Visit (HOSPITAL_COMMUNITY): Payer: BC Managed Care – PPO | Admitting: Licensed Clinical Social Worker

## 2018-12-26 ENCOUNTER — Encounter (HOSPITAL_COMMUNITY): Payer: Self-pay

## 2018-12-26 ENCOUNTER — Telehealth (HOSPITAL_COMMUNITY): Payer: Self-pay | Admitting: Professional

## 2018-12-26 ENCOUNTER — Other Ambulatory Visit: Payer: Self-pay

## 2018-12-26 DIAGNOSIS — R4589 Other symptoms and signs involving emotional state: Secondary | ICD-10-CM

## 2018-12-26 DIAGNOSIS — F314 Bipolar disorder, current episode depressed, severe, without psychotic features: Secondary | ICD-10-CM | POA: Diagnosis not present

## 2018-12-26 DIAGNOSIS — F332 Major depressive disorder, recurrent severe without psychotic features: Secondary | ICD-10-CM

## 2018-12-26 NOTE — Therapy (Signed)
Virtual Visit via Video Note  I connected with Stefanie Galvan on 12/26/18 at  61 AM EDT by a video enabled telemedicine application and verified that I am speaking with the correct person using two identifiers.   I discussed the limitations of evaluation and management by telemedicine and the availability of in person appointments. The patient expressed understanding and agreed to proceed.   I discussed the assessment and treatment plan with the patient. The patient was provided an opportunity to ask questions and all were answered. The patient agreed with the plan and demonstrated an understanding of the instructions.   The patient was advised to call back or seek an in-person evaluation if the symptoms worsen or if the condition fails to improve as anticipated.  I provided 60 minutes of non-face-to-face time during this encounter.     [place], [place], [address] Phone: [phone]   Fax:  [phone]  Occupational Therapy Treatment  Patient Details  Name: Stefanie Galvan MRN: [id number] Date of Birth: 1965/11/07 Referring Provider (OT): Ricky Ala   Encounter Date: 12/26/2018  OT End of Session - 12/26/18 1153    Visit Number  3    Number of Visits  12    Date for OT Re-Evaluation  01/14/19    Authorization Type  Medcost    OT Start Time  0930    OT Stop Time  1030    OT Time Calculation (min)  60 min    Activity Tolerance  Patient tolerated treatment well    Behavior During Therapy  [facility] for tasks assessed/performed       Past Medical History:  Diagnosis Date  . Anxiety   . Arthritis   . Depression 2.5 yrs ago   situational dep-no current tx/meds  . TBI (traumatic brain injury) ([place]) 11/10/2005    Past Surgical History:  Procedure Laterality Date  . BREAST REDUCTION SURGERY  02/25/2012   Procedure: MAMMARY REDUCTION  (BREAST);  Surgeon: Stefanie Polio, MD;   Location: [facility];  Service: Plastics;  Laterality: Bilateral;  bilateral reduction  . CHOLECYSTECTOMY N/A 07/01/2018   Procedure: LAPAROSCOPIC CHOLECYSTECTOMY;  Surgeon: Stefanie Sprague, DO;  Location: [facility];  Service: General;  Laterality: N/A;  . DILATION AND CURETTAGE OF UTERUS  2001  . [place]; 1996   X 2; bilateral  . TUBAL LIGATION  2003    There were no vitals filed for this visit.  Subjective Assessment - 12/26/18 1153    Currently in Pain?  No/denies              S:  I can't go back to my current employer, its too stressful.  O:  Group began with members brainstorming on activities they enjoyed doing, and how these could be incorporated into a work setting.  If you enjoy your work, you will be less stressed at work.  Stefanie Galvan identified that she enjoys animals, knitting, and flowers.  Stefanie Galvan was able to identify 9 possible areas of work or employers that would incorporate these passions at work.  Group was educated and discussed common sources of work stress, and how to manage or minimize these stressors.  Group was educated on soft skills and the importance of honing thise skills, as well as interview tips.   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 P:  Continue skilled OT group 2  times per week for 3 weeks              OT Education - 12/26/18 1153    Education Details  Educated patient on soft skills and their importance in the workplace, interview strategies, common work stressors and ways to manage stress at work    [employer] HotelsPerson(s) Educated  Patient    Methods  Explanation;Handout    Comprehension  Verbalized understanding       OT Short Term Goals - 12/26/18 1154      OT SHORT TERM GOAL #1   Title  Pt will be educated on strategies to improve psychosocial skills needed to participate fully in all daily, work, and leisure activities    Time  3    Period  Weeks    Status   On-going    Target Date  01/14/19      OT SHORT TERM GOAL #2   Title  Pt will apply psychosocial skills and coping mechanisms to daily activities in order to function independently and reintegrate into community    Time  3    Period  Weeks    Status  On-going      OT SHORT TERM GOAL #3   Title  Pt will engage in goal setting to improve functional BADL/IADL participation and routine prior to reintegrating into community    Time  3    Period  Weeks    Status  On-going      OT SHORT TERM GOAL #4   Title  Pt will recall and/or identify 1-3 meaningful job opportunities prior to reintegrating into communtiy    Time  3    Period  Weeks    Status  On-going      OT SHORT TERM GOAL #5   Title  Pt will identify potential support system opportunities in the community and improve comfort level in reaching out to idenitifed supports, as needed.    Time  3    Period  Weeks    Status  On-going               Plan - 12/26/18 1154    Cognitive Skills  Emotional;Problem Solve    Psychosocial Skills  Coping Strategies;Environmental  Adaptations;Habits;Interpersonal Interaction;Routines and Behaviors       Patient will benefit from skilled therapeutic intervention in order to improve the following deficits and impairments:     Cognitive Skills: Emotional, Problem Solve Psychosocial Skills: Coping Strategies, Environmental  Adaptations, Habits, Interpersonal Interaction, Routines and Behaviors   Visit Diagnosis: 1. Difficulty coping   2. Severe episode of recurrent major depressive disorder, without psychotic features [facility](HCC)       Problem List Patient Active Problem List   Diagnosis Date Noted  . Bipolar 1 disorder, depressed, severe (HCC) 12/22/2018  . Generalized anxiety disorder   . MDD (major depressive disorder), recurrent episode, severe (HCC) 12/15/2018  . Biliary colic 07/01/2018  . Bipolar affective (HCC) 09/24/2017  . Lithium overdose 09/23/2017  . Adjustment  disorder with mixed disturbance of emotions and conduct   . Lithium overdose, intentional self-harm, initial encounter (HCC) 09/22/2017  . QT prolongation 09/22/2017  . Moderate bipolar I disorder, most recent episode depressed (HCC) 03/19/2017  . Bipolar 2 disorder, major depressive episode (HCC) 03/04/2017    Class: Chronic  . Chronic post-traumatic stress disorder (PTSD) 03/04/2017    Class: Chronic  . DDD (degenerative disc disease), lumbar 03/04/2017  . Arthritis 08/29/2016    Shirlean MylarBethany H. Murray, MHA, OTR/L  [phone]  12/26/2018, 11:54 AM  [facility] [address] [place], [place], [address] Phone: [phone]   Fax:  [phone]  Name: Macarthur CritchleyKimberly D Baumgart MRN: [id number] Date of Birth: 04/24/1966

## 2018-12-26 NOTE — Psych (Addendum)
Virtual Visit via Video Note  I connected with Stefanie Galvan on 12/23/18 at  9:00 AM EDT by a video enabled telemedicine application and verified that I am speaking with the correct person using two identifiers.   I discussed the limitations of evaluation and management by telemedicine and the availability of in person appointments. The patient expressed understanding and agreed to proceed.   I discussed the assessment and treatment plan with the patient. The patient was provided an opportunity to ask questions and all were answered. The patient agreed with the plan and demonstrated an understanding of the instructions.   The patient was advised to call back or seek an in-person evaluation if the symptoms worsen or if the condition fails to improve as anticipated.  I provided 15 minutes of non-face-to-face time during this encounter.  Pt verbally agrees to treatment plan.  Royetta Crochet, [facility]

## 2018-12-29 ENCOUNTER — Telehealth (HOSPITAL_COMMUNITY): Payer: Self-pay | Admitting: Professional

## 2018-12-29 ENCOUNTER — Other Ambulatory Visit (HOSPITAL_COMMUNITY): Payer: BC Managed Care – PPO

## 2018-12-29 ENCOUNTER — Other Ambulatory Visit: Payer: Self-pay

## 2018-12-29 MED ORDER — ACETAMINOPHEN 325 MG PO TABS
650.00 | ORAL_TABLET | ORAL | Status: DC
Start: ? — End: 2018-12-29

## 2018-12-29 MED ORDER — LORAZEPAM 2 MG/ML IJ SOLN
2.00 | INTRAMUSCULAR | Status: DC
Start: ? — End: 2018-12-29

## 2018-12-29 MED ORDER — GENERIC EXTERNAL MEDICATION
5.00 | Status: DC
Start: ? — End: 2018-12-29

## 2018-12-29 MED ORDER — LOPERAMIDE HCL 2 MG PO CAPS
2.00 | ORAL_CAPSULE | ORAL | Status: DC
Start: ? — End: 2018-12-29

## 2018-12-29 MED ORDER — DIPHENHYDRAMINE HCL 25 MG PO CAPS
50.00 | ORAL_CAPSULE | ORAL | Status: DC
Start: ? — End: 2018-12-29

## 2018-12-29 MED ORDER — BENZOCAINE-MENTHOL 6-10 MG MT LOZG
1.00 | LOZENGE | OROMUCOSAL | Status: DC
Start: ? — End: 2018-12-29

## 2018-12-29 MED ORDER — DIPHENHYDRAMINE HCL 50 MG/ML IJ SOLN
50.00 | INTRAMUSCULAR | Status: DC
Start: ? — End: 2018-12-29

## 2018-12-29 MED ORDER — ONDANSETRON 8 MG PO TBDP
8.00 | ORAL_TABLET | ORAL | Status: DC
Start: ? — End: 2018-12-29

## 2018-12-29 MED ORDER — QUETIAPINE FUMARATE 50 MG PO TABS
50.00 | ORAL_TABLET | ORAL | Status: DC
Start: 2018-12-29 — End: 2018-12-29

## 2018-12-29 MED ORDER — TRAZODONE HCL 50 MG PO TABS
50.00 | ORAL_TABLET | ORAL | Status: DC
Start: ? — End: 2018-12-29

## 2018-12-29 MED ORDER — ALUMINUM-MAGNESIUM-SIMETHICONE 200-200-20 MG/5ML PO SUSP
30.00 | ORAL | Status: DC
Start: ? — End: 2018-12-29

## 2018-12-29 MED ORDER — ESCITALOPRAM OXALATE 5 MG PO TABS
5.00 | ORAL_TABLET | ORAL | Status: DC
Start: 2018-12-30 — End: 2018-12-29

## 2018-12-29 MED ORDER — LAMOTRIGINE 100 MG PO TABS
200.00 | ORAL_TABLET | ORAL | Status: DC
Start: 2018-12-30 — End: 2018-12-29

## 2018-12-29 MED ORDER — SALINE NASAL SPRAY 0.65 % NA SOLN
1.00 | NASAL | Status: DC
Start: ? — End: 2018-12-29

## 2018-12-29 MED ORDER — GENERIC EXTERNAL MEDICATION
1.00 | Status: DC
Start: 2018-12-30 — End: 2018-12-29

## 2018-12-29 MED ORDER — HALOPERIDOL 5 MG PO TABS
5.00 | ORAL_TABLET | ORAL | Status: DC
Start: ? — End: 2018-12-29

## 2018-12-29 MED ORDER — BISACODYL 5 MG PO TBEC
10.00 | DELAYED_RELEASE_TABLET | ORAL | Status: DC
Start: ? — End: 2018-12-29

## 2018-12-29 MED ORDER — GUAIFENESIN-DM 100-10 MG/5ML PO SYRP
10.00 | ORAL_SOLUTION | ORAL | Status: DC
Start: ? — End: 2018-12-29

## 2018-12-29 MED ORDER — CETIRIZINE HCL 10 MG PO TABS
10.00 | ORAL_TABLET | ORAL | Status: DC
Start: ? — End: 2018-12-29

## 2018-12-29 MED ORDER — HYDROXYZINE HCL 25 MG PO TABS
50.00 | ORAL_TABLET | ORAL | Status: DC
Start: ? — End: 2018-12-29

## 2018-12-29 MED ORDER — CLONIDINE HCL 0.1 MG PO TABS
0.10 | ORAL_TABLET | ORAL | Status: DC
Start: ? — End: 2018-12-29

## 2018-12-29 MED ORDER — NICOTINE POLACRILEX 2 MG MT GUM
2.00 | CHEWING_GUM | OROMUCOSAL | Status: DC
Start: ? — End: 2018-12-29

## 2018-12-29 NOTE — Psych (Signed)
Virtual Visit via Video Note  I connected with Stefanie Galvan on 12/24/18 at  9:00 AM EDT by a video enabled telemedicine application and verified that I am speaking with the correct person using two identifiers.   I discussed the limitations of evaluation and management by telemedicine and the availability of in person appointments. The patient expressed understanding and agreed to proceed.   I discussed the assessment and treatment plan with the patient. The patient was provided an opportunity to ask questions and all were answered. The patient agreed with the plan and demonstrated an understanding of the instructions.   The patient was advised to call back or seek an in-person evaluation if the symptoms worsen or if the condition fails to improve as anticipated.  Pt was provided 240 minutes of non-face-to-face time during this encounter.   [name], LCSW    [facility] PHP THERAPIST PROGRESS NOTE  Stefanie Galvan [id number]  Session Time: 9:00 - 10:00  Participation Level: Active  Behavioral Response: CasualAlertDepressed  Type of Therapy: Group Therapy  Treatment Goals addressed: Coping  Interventions: CBT, DBT, Solution Focused, Supportive and Reframing  Summary: Clinician led check-in regarding current stressors and situation, and review of patient completed daily inventory. Clinician utilized active listening and empathetic response and validated patient emotions. Clinician facilitated processing group on pertinent issues.   Therapist Response:Stefanie Galvan is a 53 y.o. female who presents with depression symptoms.  Patient arrived within time allowed and reports that she is feeling "depressed." Patient rates her mood at a 7 on a scale of 1-10 with 10 being great. Pt reports yesterday was not good "just because I didn't feel good." Pt reports she took a nap, worked on her Engineer, drilling, and watched a movie. Pt states her sleep was broken. Pt able to  process. Pt engaged in discussion.      Session Time: 10:00 -11:00  Participation Level:Active  Behavioral Response:CasualAlertDepressed  Type of Therapy: Group Therapy, psychoeducation, psychotherapy  Treatment Goals addressed: Coping  Interventions:CBT, DBT, Solution Focused, Supportive and Reframing  Summary: Cln led discussion on short term versus long term goals. Group members shared struggles they have with pursuing either type of goal. Cln educated on SMART goals.   Therapist Response: Pt reports she continuously sets long term goals that she cannot achieve and then feels depressed about it. Pt reports recognizing that short term goals would be more helpful to her because she could feel accomplished more often and she will attempt SMART goals.        Session Time: 11:00 -12:00  Participation Level:Active  Behavioral Response:CasualAlertDepressed  Type of Therapy: Group Therapy, psychotherapy  Treatment Goals addressed: Coping  Interventions:Strengths based, reframing, Supportive,   Summary:Spiritual Care group  Therapist Response: Patient engaged in group. See chaplain note.        Session Time: 12:00- 1:00  Participation Level:Active  Behavioral Response:CasualAlertDepressed  Type of Therapy: Group Therapy, Psychoeducation  Treatment Goals addressed: Coping  Interventions:Processing; Supportive; Reframing  Summary:12:00 - 12:50: Cln introduced topic of cognitive distortions and what they are. Cln educated pt's on behavior modification model: Catch, Challenge, Change and how it can be applied to cognitive distortions.   12:50 -1:00 Clinician led check-out. Clinician assessed for immediate needs, medication compliance and efficacy, and safety concerns   Therapist Response: Patient reports understanding of "catch, challenge, change" model. At checkout, pt rates her mood at [facility] a scale of 1-10 with 10  being great. Patient reports afternoon plans ofgoing outside and  taking a nap. Patient demonstrates some progress as evidenced by increased willingness to consider positive changes.Patient denies SI/HI/self-harm at the end of group.     Suicidal/Homicidal: Nowithout intent/plan  Plan: Pt will continue in PHP while working to stabilize post-inpatient admission, decrease depression symptoms, and increase ability to utilize healthy coping skills when symptoms arise.   Diagnosis: Bipolar 1 disorder, depressed, severe (HCC) [F31.4]    1. Bipolar 1 disorder, depressed, severe (HCC)       Stefanie GuilesJenny Cowan Pilar, LCSW 12/29/2018

## 2018-12-29 NOTE — Psych (Signed)
Virtual Visit via Video Note  I connected with Stefanie CritchleyKimberly D Becht on 12/23/18 at  9:00 AM EDT by a video enabled telemedicine application and verified that I am speaking with the correct person using two identifiers.   I discussed the limitations of evaluation and management by telemedicine and the availability of in person appointments. The patient expressed understanding and agreed to proceed.   I discussed the assessment and treatment plan with the patient. The patient was provided an opportunity to ask questions and all were answered. The patient agreed with the plan and demonstrated an understanding of the instructions.   The patient was advised to call back or seek an in-person evaluation if the symptoms worsen or if the condition fails to improve as anticipated.  Pt was provided 240 minutes of non-face-to-face time during this encounter.   Donia GuilesJenny Shylyn Younce, LCSW    [facility]  Stefanie Galvan [id number]  Session Time: 9:00 - 10:00  Participation Level: Active  Behavioral Response: CasualAlertDepressed  Type of Therapy: Group Therapy  Treatment Goals addressed: Coping  Interventions: CBT, DBT, Solution Focused, Supportive and Reframing  Summary: Clinician led check-in regarding current stressors and situation, and review of patient completed daily inventory. Clinician utilized active listening and empathetic response and validated patient emotions. Clinician facilitated processing group on pertinent issues.   Therapist Response:Stefanie Galvan is a 53 y.o. female who presents with depression symptoms.  Patient arrived within time allowed and reports that she is feeling "not in it." Patient rates her mood at a 4 on a scale of 1-10 with 10 being great. Pt reports she is "tired of fighting her chemistry" and is feeling hopeless. Pt denies SI but states it is difficult to pull herself back from it. Pt reports her sleep is not good and she is  having "horrible" nightmares. Pt reports one bright spot in her day yesterday was visiting with her daughter-in-law who "makes it hard not to feel good." Pt able to process. Pt engaged in discussion.      Session Time: 10:00 -11:00  Participation Level:Active  Behavioral Response:CasualAlertDepressed  Type of Therapy: Group Therapy, psychoeducation, psychotherapy  Treatment Goals addressed: Coping  Interventions:CBT, DBT, Solution Focused, Supportive and Reframing  Summary:Cln led discussion on coping when unable to step away to somewhere private such as at work, in class, or when watching children. Cln reviewed thought based distraction skills and ways to make self-soothing accessible. Group shared ways they have managed in those situations. Cln encouraged group members to use group as a time to practice if they begin having escalated feelings.  Therapist Response:Pt engaged in discussion. Ptidentified listening to music or singing a song in her head, playing a game on her phone, and thinking of gratitudes as ways to practice when around others.         Session Time: 11:00 -12:00  Participation Level:Active  Behavioral Response:CasualAlertDepressed  Type of Therapy: Group Therapy, psychoeducation, psychotherapy  Treatment Goals addressed: Coping  Interventions:CBT, DBT, Solution Focused, Supportive and Reframing  Summary:Cln continued topic of boundaries. Cln led review of previously discussed aspects of boundaries and then group worked to problem solve common issues with setting boundaries. Group members volunteered issues they have had with setting boundaries and cln and group provided ways to manage the issue.  Therapist Response:Patient engaged in group.Ptshares difficulty with reinforcing boundaries and becoming defeated when the person resists the boundary. Pt accepts feedback from group members.       Session Time:  12:00- 1:00  Participation Level:Active  Behavioral Response:CasualAlertDepressed  Type of Therapy: Group Therapy, Psychoeducation; Psychotherapy  Treatment Goals addressed: Coping  Interventions:CBT; Solution focused; Supportive; Reframing  Summary:12:00 -12:50Cln led boundary workshop in which group member's shared a recent boundary issue and how they handled it. Remaining group members were cued to provide helpful and constructive feedback using their knowledge of boundaries. 12:50 -1:00 Clinician led check-out. Clinician assessed for immediate needs, medication compliance and efficacy, and safety concerns   Therapist Response: Patientshares boundary issues with her mother, and is resistant to suggestions made stating they are all "not going to work." At checkout, pt rates her mood at [employer] a scale of 1-10 with 10 being great. Patient reports afternoon plans oftaking a nap and working on a Engineer, drilling. Patient demonstrates some progress as evidenced by participating in first group session.Patient denies SI/HI/self-harm at the end of group.     Suicidal/Homicidal: Nowithout intent/plan  Plan: Pt will continue in PHP while working to stabilize post-inpatient admission, decrease depression symptoms, and increase ability to utilize healthy coping skills when symptoms arise.   Diagnosis: Bipolar 1 disorder, depressed, severe ([place]) [F31.4]    1. Bipolar 1 disorder, depressed, severe ([place])       [name], LCSW 12/29/2018

## 2018-12-30 ENCOUNTER — Other Ambulatory Visit: Payer: Self-pay

## 2018-12-30 ENCOUNTER — Other Ambulatory Visit (HOSPITAL_COMMUNITY): Payer: BC Managed Care – PPO | Admitting: Family

## 2018-12-30 ENCOUNTER — Other Ambulatory Visit (HOSPITAL_COMMUNITY): Payer: BC Managed Care – PPO

## 2018-12-30 MED ORDER — QUETIAPINE FUMARATE 200 MG PO TABS
200.00 | ORAL_TABLET | ORAL | Status: DC
Start: 2018-12-30 — End: 2018-12-30

## 2018-12-31 ENCOUNTER — Other Ambulatory Visit (HOSPITAL_COMMUNITY): Payer: PRIVATE HEALTH INSURANCE

## 2019-01-01 ENCOUNTER — Ambulatory Visit (HOSPITAL_COMMUNITY): Payer: PRIVATE HEALTH INSURANCE

## 2019-01-01 ENCOUNTER — Telehealth (HOSPITAL_COMMUNITY): Payer: Self-pay | Admitting: Professional

## 2019-01-01 ENCOUNTER — Other Ambulatory Visit (HOSPITAL_COMMUNITY): Payer: PRIVATE HEALTH INSURANCE

## 2019-01-01 NOTE — Telephone Encounter (Signed)
Cln returned pt call to join PHP. Pt reports

## 2019-01-01 NOTE — Telephone Encounter (Signed)
Cln returned pt call. Pt reports she was inpt due to "being in a manic" and "now I've come down." Pt reports she was inpt at [facility] and meds were changed from Abilify to Seroquel. Pt states "I feel clearer now." Cln discusses issues with pt not returning after breaks and not discussing what was going on even when provided opportunities to do so. Pt reports she understands and wants to "do better this time." Pt states the doctor inpt recommended she return to [facility] and that she stay out of work until the program is completed. Pt agrees with the recommendation. Cln will email pw to pt at [email or web address]. Pt will start group on Monday, 7/20. 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 She reports she feels safe and really [place] Cln and pt safety plan. Pt reports she will call "[phone]" [phone], [facility] Hotline) if needed. Pt agrees to return paperwork by email prior to noon on Friday, 7/17.

## 2019-01-02 ENCOUNTER — Other Ambulatory Visit (HOSPITAL_COMMUNITY): Payer: PRIVATE HEALTH INSURANCE

## 2019-01-02 ENCOUNTER — Ambulatory Visit (HOSPITAL_COMMUNITY): Payer: PRIVATE HEALTH INSURANCE

## 2019-01-05 ENCOUNTER — Other Ambulatory Visit (HOSPITAL_COMMUNITY): Payer: BC Managed Care – PPO | Admitting: Licensed Clinical Social Worker

## 2019-01-05 ENCOUNTER — Other Ambulatory Visit (HOSPITAL_COMMUNITY): Payer: PRIVATE HEALTH INSURANCE

## 2019-01-05 ENCOUNTER — Encounter (HOSPITAL_COMMUNITY): Payer: Self-pay | Admitting: Family

## 2019-01-05 DIAGNOSIS — F314 Bipolar disorder, current episode depressed, severe, without psychotic features: Secondary | ICD-10-CM | POA: Diagnosis not present

## 2019-01-05 DIAGNOSIS — F319 Bipolar disorder, unspecified: Secondary | ICD-10-CM

## 2019-01-05 NOTE — Progress Notes (Signed)
Virtual Visit via Video Note  I connected with Stefanie CritchleyKimberly D Galvan on 01/05/19 at  9:00 AM EDT by a video enabled telemedicine application and verified that I am speaking with the correct person using two identifiers.   I discussed the limitations of evaluation and management by telemedicine and the availability of in person appointments. The patient expressed understanding and agreed to proceed.   I discussed the assessment and treatment plan with the patient. The patient was provided an opportunity to ask questions and all were answered. The patient agreed with the plan and demonstrated an understanding of the instructions.   The patient was advised to call back or seek an in-person evaluation if the symptoms worsen or if the condition fails to improve as anticipated.  I provided 15 minutes of non-face-to-face time during this encounter.   Stefanie Rackanika N Daryana Whirley, NP   Behavioral Health Partial Program Assessment Note  Date: 12/23/2018 Name: Stefanie Galvan MRN: [id number]   HPI: Stefanie HodgkinsKimberly Galvan  is a 53 y.o. Caucasian female presents with worsening depression and increased anxiety related to multiple stressors.  Patient recently discharged from another inpatient admission on 01/02/2019, [name]  On 01/05/2019.Stefanie Galvan reported medication adjustment was made while she was inpatient. Stated she was discontinued from Abilify and started on Seroquel 200mg . was reported from previous  admissions  due to suicidal ideations.    Patient reports she is currently followed by Dr. Lolly Galvan with diagnosis of bipolar, depression and anxiety.  Reports feeling distraught as she has not been able to follow-up with the therapist since 1 year prior as she was followed by Stefanie Galvan.  Reports she is currently employed by [employer] company  [employer] who she reports is now requiring increased sales demand she reports minimal compensation.  Patient reports the lack of  "consistency "is stressing her out.  Reports she is recently applied for different positions.  States she currently resides with her mother and brother and has noticed unfair treatment by her mother.  Patient reports favoritism towards her brother.  Which causes her to feel sad and depressed.  Stefanie Galvan reports financially she is unable to live on her own. She stated she has been divorce for the past 5+ years with 3 children.  Reports a good relationship between her children.  Stated her children currently reside throughout [place] and appear to be doing well.  States her youngest son who was [id number] recently had a DUI.  However she has been supportive throughout this time for him.  Patient was enrolled in partial psychiatric program on 12/23/18.  Primary complaints include: agitation, problem with medication, relationship difficulties and stressed at work.  Onset of symptoms was gradual with gradually worsening course since that time. Psychosocial Stressors include the following: family, financial and occupational.   I have reviewed the following documentation dated past psychiatric history, past medical history, past social and family history and past Review of systems  Complaints of Pain: back pain   Past Psychiatric History: Previous inpatient admissions.  Diagnosis of bipolar depression and anxiety.  History of self-injurious behaviors. First psychiatric contact , Past psychiatric hospitalizations and Previous suicide attempts multiple  Currently in treatment with: Lamictal 200, Lexapro 5 and Abilify 10  Substance Abuse History: none Use of Alcohol: denied Use of Caffeine: denies use Use of over the counter:   Past Surgical History:  Procedure Laterality Date  . BREAST REDUCTION SURGERY  02/25/2012   Procedure: MAMMARY REDUCTION  (BREAST);  Surgeon: Cristine Polio, MD;  Location: [facility];  Service: Plastics;  Laterality: Bilateral;  bilateral reduction  .  CHOLECYSTECTOMY N/A 07/01/2018   Procedure: LAPAROSCOPIC CHOLECYSTECTOMY;  Surgeon: Benjamine Sprague, DO;  Location: [facility];  Service: General;  Laterality: N/A;  . DILATION AND CURETTAGE OF UTERUS  2001  . [place]; 1996   X 2; bilateral  . TUBAL LIGATION  2003    Past Medical History:  Diagnosis Date  . Anxiety   . Arthritis   . Depression 2.5 yrs ago   situational dep-no current tx/meds  . TBI (traumatic brain injury) ([place]) 11/10/2005   Outpatient Encounter Medications as of 12/23/2018  Medication Sig  . ARIPiprazole (ABILIFY) 10 MG tablet Take 1 tablet (10 mg total) by mouth daily.  . cyclobenzaprine (FLEXERIL) 5 MG tablet Take 1 tablet (5 mg total) by mouth 3 (three) times daily.  Marland Kitchen escitalopram (LEXAPRO) 5 MG tablet Take 1 tablet (5 mg total) by mouth daily.  Marland Kitchen lamoTRIgine (LAMICTAL) 200 MG tablet Take 1 tablet (200 mg total) by mouth daily.   No facility-administered encounter medications on file as of 12/23/2018.    Allergies  Allergen Reactions  . Anaprox [Naproxen Sodium] Anaphylaxis and Shortness Of Breath    Heart palpitations, shortness of breath, generalized swelling    Social History   Tobacco Use  . Smoking status: Never Smoker  . Smokeless tobacco: Never Used  Substance Use Topics  . Alcohol use: Not Currently   Functioning Relationships: strained with co-workers, good relationship with children and poor relationship with parents Education: Other  Other Pertinent History: None Family History  Problem Relation Age of Onset  . Depression Father   . Anxiety disorder Brother   . Depression Maternal Grandmother      Review of Systems Constitutional: negative  Objective:  There were no vitals filed for this visit.  Physical Exam:   Mental Status Exam: Appearance:  N/A poor quality video Psychomotor::  N/A Attention span and concentration: Normal Behavior: calm, cooperative and adequate rapport can be established Speech:   normal pitch Mood:  depressed and anxious Affect:  normal Thought Process:  Coherent Thought Content:  WDL Orientation:  person, place and time/date Cognition:  grossly intact Insight:  Fair Judgment:  Intact Estimate of Intelligence: Average Fund of knowledge: Aware of current events Memory: Recent and remote intact Abnormal movements: None Gait and station: Normal  Assessment:  Diagnosis: Bipolar 1 disorder, depressed, severe (HCC) [F31.4] 1. Bipolar 1 disorder, depressed, severe ([place])     Indications for admission: inpatient care required if not in partial hospital program  Plan: Orders placed for OT Occupational Therapy 01/05/2019 patient enrolled in Partial Hospitalization Program, patient's current medications are to be continued, a comprehensive treatment plan will be developed and side effects of medications have been reviewed with patient.  - Started on Seroquel 200 mg PO QHS  - Discontinued from Abilify as reported. Recent inpatient admission  Treatment options and alternatives reviewed with patient and patient understands the above plan.  Treatment plan was reviewed and agreed upon by NPT Jemery Stacey and patient Jandy Brackens need for group services.    [name], NP

## 2019-01-06 ENCOUNTER — Ambulatory Visit (HOSPITAL_COMMUNITY): Payer: PRIVATE HEALTH INSURANCE

## 2019-01-06 ENCOUNTER — Other Ambulatory Visit: Payer: Self-pay

## 2019-01-06 ENCOUNTER — Other Ambulatory Visit (HOSPITAL_COMMUNITY): Payer: BC Managed Care – PPO | Admitting: Licensed Clinical Social Worker

## 2019-01-06 ENCOUNTER — Encounter (HOSPITAL_COMMUNITY): Payer: Self-pay | Admitting: Occupational Therapy

## 2019-01-06 ENCOUNTER — Other Ambulatory Visit (HOSPITAL_COMMUNITY): Payer: PRIVATE HEALTH INSURANCE

## 2019-01-06 ENCOUNTER — Other Ambulatory Visit (HOSPITAL_COMMUNITY): Payer: BC Managed Care – PPO | Admitting: Occupational Therapy

## 2019-01-06 DIAGNOSIS — F314 Bipolar disorder, current episode depressed, severe, without psychotic features: Secondary | ICD-10-CM

## 2019-01-06 DIAGNOSIS — R4589 Other symptoms and signs involving emotional state: Secondary | ICD-10-CM

## 2019-01-06 DIAGNOSIS — F319 Bipolar disorder, unspecified: Secondary | ICD-10-CM

## 2019-01-06 NOTE — Psych (Signed)
Virtual Visit via Video Note  I connected with Stefanie Galvan on 12/25/18 at  9:00 AM EDT by a video enabled telemedicine application and verified that I am speaking with the correct person using two identifiers.   I discussed the limitations of evaluation and management by telemedicine and the availability of in person appointments. The patient expressed understanding and agreed to proceed.   I discussed the assessment and treatment plan with the patient. The patient was provided an opportunity to ask questions and all were answered. The patient agreed with the plan and demonstrated an understanding of the instructions.   The patient was advised to call back or seek an in-person evaluation if the symptoms worsen or if the condition fails to improve as anticipated.  Pt was provided 240 minutes of non-face-to-face time during this encounter.   [name], LCSW    [facility] PHP THERAPIST PROGRESS NOTE  Stefanie Galvan [id number]  Session Time: 9:00 - 10:00  Participation Level: Active  Behavioral Response: CasualAlertDepressed  Type of Therapy: Group Therapy  Treatment Goals addressed: Coping  Interventions: CBT, DBT, Solution Focused, Supportive and Reframing  Summary: Clinician led check-in regarding current stressors and situation, and review of patient completed daily inventory. Clinician utilized active listening and empathetic response and validated patient emotions. Clinician facilitated processing group on pertinent issues.   Therapist Response:Stefanie Galvan is a 53 y.o. female who presents with depression symptoms.  Patient arrived within time allowed and reports that she is feeling "irritable and anxious." Patient rates her mood at a 5.5 on a scale of 1-10 with 10 being great. Pt reports her anxiety is high this morning and it is residual from her mom "triggering" her yesterday. Pt states working on her weaving hoop to manage her feelings.  Pt able to  process. Pt engaged in discussion.      Session Time: 10:00 -11:00  Participation Level:Active  Behavioral Response:CasualAlertDepressed  Type of Therapy: Group Therapy, psychoeducation, psychotherapy  Treatment Goals addressed: Coping  Interventions:CBT, DBT, Solution Focused, Supportive and Reframing  Summary:Cln led discussion on how to focus on what we can control in terms of relationships. Group discussed issues they are having with people in their lives and how to either ask for what they would like, or if unable to do so, to change perspective to decrease misery.   Therapist Response:Pt engaged in discussion. Pt states feeling "constantly" heightened by her mother and shares that her mother will not respect her boundaries. Pt reports understanding of changing perception as a way of alleviating misery and is able to apply that to her and her mom's relationship. Pt states she will try to have compassion for her mom.        Session Time: 11:00 -12:00  Participation Level:Active  Behavioral Response:CasualAlertDepressed  Type of Therapy: Group Therapy, OT  Treatment Goals addressed: Coping  Interventions:Psychosocial skills training, Supportive,   Summary:Occupational Therapy group  Therapist Response: Patient engaged in group. See OT note.   *Pt chose to leave group at 11:45 without notice. Cln followed up with pt later by phone and pt states she was feeling "paranoid" in group and couldn't "handle it anymore." Pt denies SI/HI/self harm thoughts on the phone.      Suicidal/Homicidal: Nowithout intent/plan  Plan: Pt will continue in PHP while working to stabilize post-inpatient admission, decrease depression symptoms, and increase ability to utilize healthy coping skills when symptoms arise.   Diagnosis: Bipolar 1 disorder, depressed, severe ([place]) [F31.4]  1. Bipolar 1 disorder, depressed, severe (HCC)       Stefanie GuilesJenny  Charl Wellen, LCSW 01/06/2019

## 2019-01-06 NOTE — Progress Notes (Signed)
Spoke with patient via Webex video call, used 2 identifiers to correctly identify patient. Appropriate mood and affect. Started Seroquel 200mg  at 8pm but thinks it could be increased a little more. She has been feeling "edgy" in the afternoons around 2pm. Thinks the increase would help with this anxiety. Denies any side effects from medications. On scale of 1-10 as 10 being worst she rates depression at 2 and anxiety at 2/3. Denies SI/HI or AV hallucinations. Has not seen any bugs or animals. Feels that group is going well for her. No issues or complaints.

## 2019-01-06 NOTE — Psych (Signed)
Virtual Visit via Video Note  I connected with Stefanie Galvan on 12/26/18 at  9:00 AM EDT by a video enabled telemedicine application and verified that I am speaking with the correct person using two identifiers.   I discussed the limitations of evaluation and management by telemedicine and the availability of in person appointments. The patient expressed understanding and agreed to proceed.   I discussed the assessment and treatment plan with the patient. The patient was provided an opportunity to ask questions and all were answered. The patient agreed with the plan and demonstrated an understanding of the instructions.   The patient was advised to call back or seek an in-person evaluation if the symptoms worsen or if the condition fails to improve as anticipated.  Pt was provided 150 minutes of non-face-to-face time during this encounter.   [name], LCSW    [facility] PHP THERAPIST PROGRESS NOTE  Stefanie Galvan [id number]  Session Time: 9:00 - 10:00  Participation Level: Active  Behavioral Response: CasualAlertDepressed  Type of Therapy: Group Therapy  Treatment Goals addressed: Coping  Interventions: CBT, DBT, Solution Focused, Supportive and Reframing  Summary: Clinician led check-in regarding current stressors and situation, and review of patient completed daily inventory. Clinician utilized active listening and empathetic response and validated patient emotions. Clinician facilitated processing group on pertinent issues.   Therapist Response:Stefanie Galvan is a 53 y.o. female who presents with depression symptoms.  Patient arrived within time allowed and reports that she is feeling "good." Patient rates her mood at a 8 on a scale of 1-10 with 10 being great. Pt reports she got out of the house yesterday and it was "really what I needed." Pt shares she spent slept well, resisted impulse purchases, and tried out boundaries with her mom. Pt reports feeling  proud of herself for making healthy choices. Pt engaged in discussion.       Session Time: 10:00 -11:00  Participation Level:Active  Behavioral Response:CasualAlertDepressed  Type of Therapy: Group Therapy, psychoeducation, psychotherapy  Treatment Goals addressed: Coping  Interventions:CBT, DBT, Solution Focused, Supportive and Reframing  Summary:Cln led discussion on ways to manage emotional reactions while out in public. Cln brought in grounding and distress tolerance skills as needed. Group members shared their struggles with managing in public and provided feedback to one another.   Therapist Response:Pt engaged in discussion. Pt reports she often feels self-conscious in public and that she will "mess up" and people will stare. Pt able to identify strategies to try next time it happens.        Session Time: 11:00 -12:00  Participation Level:Active  Behavioral Response:CasualAlertDepressed  Type of Therapy: Group Therapy, OT  Treatment Goals addressed: Coping  Interventions:Psychosocial skills training, Supportive,   Summary:Occupational Therapy group  Therapist Response: Patient engaged in group. See OT note.   *Pt chose to leave group approximately 11:30 without notice. Cln called to f/u and was unable to get pt on the phone.      Suicidal/Homicidal: Nowithout intent/plan  Plan: Pt will continue in PHP while working to stabilize post-inpatient admission, decrease depression symptoms, and increase ability to utilize healthy coping skills when symptoms arise.   Diagnosis: Bipolar 1 disorder, depressed, severe ([place]) [F31.4]    1. Bipolar 1 disorder, depressed, severe ([place])       [name], LCSW 01/06/2019

## 2019-01-07 ENCOUNTER — Other Ambulatory Visit: Payer: Self-pay

## 2019-01-07 ENCOUNTER — Other Ambulatory Visit (HOSPITAL_COMMUNITY): Payer: PRIVATE HEALTH INSURANCE

## 2019-01-07 ENCOUNTER — Other Ambulatory Visit (HOSPITAL_COMMUNITY): Payer: BC Managed Care – PPO | Admitting: Licensed Clinical Social Worker

## 2019-01-07 DIAGNOSIS — F314 Bipolar disorder, current episode depressed, severe, without psychotic features: Secondary | ICD-10-CM | POA: Diagnosis not present

## 2019-01-07 NOTE — Therapy (Signed)
[place], [place], [address] Phone: [phone]   Fax:  [phone]  Occupational Therapy Evaluation  Patient Details  Name: Stefanie Galvan MRN: [id number] Date of Birth: Apr 09, 1966 Referring Provider (OT): Ricky Ala, NP  Virtual Visit via Video Note  I connected with Stefanie Galvan on 01/07/19 at  8:00 AM EDT by a video enabled telemedicine application and verified that I am speaking with the correct person using two identifiers.   I discussed the limitations of evaluation and management by telemedicine and the availability of in person appointments. The patient expressed understanding and agreed to proceed.   I discussed the assessment and treatment plan with the patient. The patient was provided an opportunity to ask questions and all were answered. The patient agreed with the plan and demonstrated an understanding of the instructions.   The patient was advised to call back or seek an in-person evaluation if the symptoms worsen or if the condition fails to improve as anticipated.  I provided 60 minutes of non-face-to-face time during this encounter.  Stefanie Galvan, MSOT, OTR/L Behavioral Health OT/ Acute Relief OT PHP Office: [phone]  Stefanie Galvan, [place]    Encounter Date: 01/06/2019  OT End of Session - 01/07/19 0837    Visit Number  1    Number of Visits  12    Date for OT Re-Evaluation  02/04/19    Authorization Type  [employer]    OT Start Time  0100    OT Stop Time  1200    OT Time Calculation (min)  660 min    Activity Tolerance  Patient tolerated treatment well    Behavior During Therapy  [facility] for tasks assessed/performed       Past Medical History:  Diagnosis Date  . Anxiety   . Arthritis   . Depression 2.5 yrs ago   situational dep-no current tx/meds  . TBI (traumatic brain injury) ([place]) 11/10/2005    Past Surgical History:  Procedure Laterality Date   . BREAST REDUCTION SURGERY  02/25/2012   Procedure: MAMMARY REDUCTION  (BREAST);  Surgeon: Cristine Polio, MD;  Location: [facility];  Service: Plastics;  Laterality: Bilateral;  bilateral reduction  . CHOLECYSTECTOMY N/A 07/01/2018   Procedure: LAPAROSCOPIC CHOLECYSTECTOMY;  Surgeon: Benjamine Sprague, DO;  Location: [facility];  Service: General;  Laterality: N/A;  . DILATION AND CURETTAGE OF UTERUS  2001  . [place]; 1996   X 2; bilateral  . TUBAL LIGATION  2003    There were no vitals filed for this visit.     [facility] OT Assessment - 01/07/19 0001      Assessment   Medical Diagnosis  Bipolar disorder    Referring Provider (OT)  Ricky Ala, NP    Onset Date/Surgical Date  01/07/19      Precautions   Precautions  None      Restrictions   Weight Bearing Restrictions  No      Balance Screen   Has the patient fallen in the past 6 months  No    Has the patient had a decrease in activity level because of a fear of falling?   No    Is the patient reluctant to leave their home because of a fear of falling?   No        OT assessment: OCAIRS  Diagnosis: Bipolar 1 disorder  Past medical history/referral information: Pt presents as a return to  PHP after initially starting program, then going inpatient, then [facility]. Eval information gathered from recent evaluation for pt reports no change since recent admin. [facility]. Has been a PHP participant in 2018 as well.  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 and lack of routine  Leisure: Education officer, communityainting and knitting  Social support: lacking  Struggles:  Finding employment that is meaningful and low stress, setting boundaries assertively with mom, setting short term goals, identifying and utilizing support system OT goal: Improve job  Public house managersearching skills   OCAIRS Mental Health Interview Summary of Client Scores:  FACILITATES PARTICIPATION IN OCCUPATION  ALLOWS PARTICIPATION IN OCCUPATION INHIBITS PARTICIPATION IN OCCUPATION RESTRICTS PARTICIPATION IN OCCUPATION COMMENTS  ROLES             X    HABITS             X    PERSONAL CAUSATION             X    VALUES             X    INTERESTS             X    SKILLS            X     SHORT TERM GOALS       LONG TERM GOALS             X    INTERPETATION OF PAST EXPERIENCES             X    PHYSICAL ENVIRONMENT           X      SOCIAL ENVIRONMENT             X    READINESS FOR CHANGE             X      Need for Occupational Therapy:  4 Shows positive occupational participation, no need for OT.   3 Need for minimal intervention/consultative participation   2 Need for OT intervention indicated to restore/improve participation   1 Need for extensive OT intervention indicated to improve participation.  Referral for follow up services also recommended.    Assessment:  Patient demonstrates behavior that inhibits participation in occupation.  Patient will benefit from occupational therapy intervention in order to improve time management, financial management, stress management, job readiness skills, social skills, and health management skills in preparation to return to full time community living and to be a productive community member.    Plan:  Patient will participate in skilled occupational therapy sessions individually or in a group setting to improve coping skills, psychosocial skills, and emotional skills required to return to prior level of function.  Treatment will be 3 times per week for 4 weeks.     OT TREATMENT  S: "I have been using sleep time stories with an app"   O: Pt educated on sleep hygiene as it pertains to daily life/routines this date. Education given on appropriate sleep routines, sleep disorders, detriments of too much/too little sleep with encouraged  feedback of personal Experiences. Sleep hygiene handout given for pt to choose new areas for implementation in BADL routine. Further education given on relaxation techniques to implement before bed. Pt asked to identify one  STG in relation to sleep hygiene to create better daily sleep habits.   A: Pt presents to group with blunted affect, engaged and participatory throughout session. Pt states that she is somewhat familiar with sleep hygiene. She shares she uses an app to help facilitate relaxation and listen to sleep time stories. She also uses her phone to set a bed time reminder and alarm. She shares after this session she would like to use the 20 minute rule for nights she cannot sleep.  P: OT group will be x3 per week while pt in PHP.              OT Education - 01/07/19 [phone]    Education Details  education given on sleep hygiene skills    Person(s) Educated  Patient    Methods  Explanation;Handout    Comprehension  Verbalized understanding       OT Short Term Goals - 01/07/19 0840      OT SHORT TERM GOAL #1   Title  Pt will be educated on strategies to improve psychosocial skills needed to participate fully in all daily, work, and leisure activities    Time  4    Period  Weeks    Status  New    Target Date  02/04/19      OT SHORT TERM GOAL #2   Title  Pt will apply psychosocial skills and coping mechanisms to daily activities in order to function independently and reintegrate into community    Time  4    Period  Weeks    Status  New    Target Date  02/04/19      OT SHORT TERM GOAL #3   Title  Pt will engage in goal setting to improve functional BADL/IADL participation and routine prior to reintegrating into community    Time  4    Period  Weeks    Status  New    Target Date  02/04/19      OT SHORT TERM GOAL #4   Title  Pt will recall and/or identify 1-3 meaningful job opportunities prior to reintegrating into communtiy    Time  4    Period  Weeks    Status   New    Target Date  02/04/19      OT SHORT TERM GOAL #5   Title  Pt will identify potential support system opportunities in the community and improve comfort level in reaching out to idenitifed supports, as needed.    Time  4    Period  Weeks    Status  New    Target Date  02/04/19               Plan - 01/07/19 0838    OT Occupational Profile and History  Problem Focused Assessment - Including review of records relating to presenting problem    Occupational performance deficits (Please refer to evaluation for details):  ADL's;Work;Social Participation    Cognitive Skills  Emotional;Problem Solve    Psychosocial Skills  Coping Strategies;Environmental  Adaptations;Habits;Interpersonal Interaction;Routines and Behaviors    Rehab Potential  Good    Clinical Decision Making  Limited treatment options, no task modification necessary    Comorbidities Affecting Occupational Performance:  None    Modification or Assistance to Complete Evaluation   No modification of tasks or assist necessary to complete eval    OT Frequency  3x / week    OT Duration  4 weeks    OT Treatment/Interventions  Self-care/ADL training;Psychosocial skills training;Coping strategies training;Cognitive remediation/compensation;Patient/family education       Patient will benefit from skilled therapeutic intervention in order to improve the following deficits and impairments:     Cognitive Skills: Emotional, Problem Solve Psychosocial Skills: Coping Strategies, Environmental  Adaptations, Habits, Interpersonal Interaction, Routines and Behaviors   Visit Diagnosis: 1. Bipolar 1 disorder, depressed (H[facility])   2. Difficulty coping       Problem List Patient Active Problem List   Diagnosis Date Noted  . Bipolar 1 disorder, depressed, severe (HCC) 12/22/2018  . Generalized anxiety disorder   . MDD (major depressive disorder), recurrent episode, severe (HCC) 12/15/2018  . Biliary colic 07/01/2018  . Bipolar  affective (HCC) 09/24/2017  . Lithium overdose 09/23/2017  . Adjustment disorder with mixed disturbance of emotions and conduct   . Lithium overdose, intentional self-harm, initial encounter (HCC) 09/22/2017  . QT prolongation 09/22/2017  . Moderate bipolar I disorder, most recent episode depressed (HCC) 03/19/2017  . Bipolar 2 disorder, major depressive episode (HCC) 03/04/2017    Class: Chronic  . Chronic post-traumatic stress disorder (PTSD) 03/04/2017    Class: Chronic  . DDD (degenerative disc disease), lumbar 03/04/2017  . Arthritis 08/29/2016   Stefanie HandingKaylee Tamarick Galvan, MSOT, OTR/L Behavioral Health OT/ Acute Relief OT PHP Office: [phone]  Stefanie HandingKaylee Yekaterina Galvan 01/07/2019, 8:42 AM  [facility]AV[facility] PRO[facility] [address] [place], [place], [address] Phone: [phone]   Fax:  [phone]  Name: Stefanie Galvan MRN: [id number] Date of Birth: 07/25/1965

## 2019-01-07 NOTE — Psych (Signed)
Virtual Visit via Video Note  I connected with [name] on 01/05/19 at  9:00 AM EDT by a video enabled telemedicine application and verified that I am speaking with the correct person using two identifiers.   I discussed the limitations of evaluation and management by telemedicine and the availability of in person appointments. The patient expressed understanding and agreed to proceed.   I discussed the assessment and treatment plan with the patient. The patient was provided an opportunity to ask questions and all were answered. The patient agreed with the plan and demonstrated an understanding of the instructions.   The patient was advised to call back or seek an in-person evaluation if the symptoms worsen or if the condition fails to improve as anticipated.  Pt was provided 180 minutes of non-face-to-face time during this encounter.   [name], LCSW    [facility] PHP THERAPIST PROGRESS NOTE  TIARA MAULTSBY [phone]  Session Time: 9:00 - 10:00  Participation Level: Did Not Attend  Behavioral Response: CasualAlertDepressed  Type of Therapy: Group Therapy  Treatment Goals addressed: Coping  Interventions: CBT, DBT, Solution Focused, Supportive and Reframing  Summary: Clinician led check-in regarding current stressors and situation, and review of patient completed daily inventory. Clinician utilized active listening and empathetic response and validated patient emotions. Clinician facilitated processing group on pertinent issues.   Therapist Response:Lewanda D Lamoreaux is a 53 y.o. female who presents with depression symptoms.  Patient arrived late to group citing technology issues      Session Time: 10:00 -11:00  Participation Level:Active  Behavioral Response:CasualAlertDepressed  Type of Therapy: Group Therapy, psychoeducation, psychotherapy  Treatment Goals addressed: Coping  Interventions:CBT, DBT, Solution Focused,  Supportive and Reframing  Summary:Cln led discussion on returning to work post-treatment. Pt's shared concerns about returning to work and group problem solved ways to address concerns.    Therapist Response:Pt shares she does not enjoy her job and is not looking forward to returning. Pt states not having specific issues with returning from an absence, but rather because she would rather not be there. Pt able to process and provide support.        Session Time: 11:00 -12:00  Participation Level:Active  Behavioral Response:CasualAlertDepressed  Type of Therapy: Group Therapy, psychoeducation, psychotherapy  Treatment Goals addressed: Coping  Interventions:CBT, DBT, Solution Focused, Supportive and Reframing  Summary:Cln continued topic of distress tolerance. Cln led review of previously discussed distress tolerance skills. Group discussed "P-T-S" of the ACCEPTS skill and group members shared how they can practice these skills.   Therapist Response:Pt reports understanding of skills discussed. Pt identifies playing categories as a way she can practice the "T," thoughts skill.          Session Time: 12:00- 1:00  Participation Level:Active  Behavioral Response:CasualAlertDepressed  Type of Therapy: Group Therapy, Psychoeducation; Psychotherapy  Treatment Goals addressed: Coping  Interventions:CBT; Solution focused; Supportive; Reframing  Summary:12:00 -12:50Clinician continued topic of distress tolerance skills and introduced Self Soothe skill. Group members discussed ways they would utilize self soothe in their everyday life.   12:50 -1:00 Clinician led check-out. Clinician assessed for immediate needs, medication compliance and efficacy, and safety concerns   Therapist Response:Patient engaged in discussion. Pt reports being with her pets, butter, and nature sounds as ways she can practice the self soothe skill. At  checkout, pt rates her mood at a6 on a scale of 1-10 with 10 being great. Patient reports afternoon plans ofvisiting her daughter-in-law. Patient demonstrates some progress as evidenced by  participating in first group session.Patient denies SI/HI/self-harm at the end of group.     Suicidal/Homicidal: Nowithout intent/plan  Plan: Pt will continue in PHP while working to stabilize post-inpatient admission, decrease depression symptoms, and increase ability to utilize healthy coping skills when symptoms arise.   Diagnosis: Bipolar 1 disorder, depressed (H[facility]) [F31.9]    1. Bipolar 1 disorder, depressed (HCC)       Donia GuilesJenny Rihanna Marseille, LCSW 01/07/2019

## 2019-01-07 NOTE — Psych (Signed)
Virtual Visit via Video Note  I connected with Stefanie Galvan on 01/06/19 at  9:00 AM EDT by a video enabled telemedicine application and verified that I am speaking with the correct person using two identifiers.   I discussed the limitations of evaluation and management by telemedicine and the availability of in person appointments. The patient expressed understanding and agreed to proceed.   I discussed the assessment and treatment plan with the patient. The patient was provided an opportunity to ask questions and all were answered. The patient agreed with the plan and demonstrated an understanding of the instructions.   The patient was advised to call back or seek an in-person evaluation if the symptoms worsen or if the condition fails to improve as anticipated.  Pt was provided 240 minutes of non-face-to-face time during this encounter.   Donia GuilesJenny Graceann Boileau, LCSW    [facility] PHP THERAPIST PROGRESS NOTE  Stefanie Galvan [phone]  Session Time: 9:00 - 10:00  Participation Level: Did Not Attend  Behavioral Response: CasualAlertDepressed  Type of Therapy: Group Therapy  Treatment Goals addressed: Coping  Interventions: CBT, DBT, Solution Focused, Supportive and Reframing  Summary: Clinician led check-in regarding current stressors and situation, and review of patient completed daily inventory. Clinician utilized active listening and empathetic response and validated patient emotions. Clinician facilitated processing group on pertinent issues.   Therapist Response:Stefanie Galvan is a 53 y.o. female who presents with depression symptoms. Patient arrived within time allowed and reports that she is feeling "pretty good." Patient rates her mood at a 7.5 on a scale of 1-10 with 10 being great. Pt reports she got out of the house yesterday which was good for her. Pt reports doing some deliveries and shopping for a dress for her son's wedding. Pt reports a spike of anxiety  in the afternoon and managed by listening to a soothing story. Pt states her mom respected her boundary and pt verbalized appreciation. Pt reports struggling with managing self-concousness when in public. Pt able to process.Patient engaged in discussion.     Session Time: 10:00 -11:00  Participation Level:Active  Behavioral Response:CasualAlertDepressed  Type of Therapy: Group Therapy, psychoeducation, psychotherapy  Treatment Goals addressed: Coping  Interventions:CBT, DBT, Solution Focused, Supportive and Reframing  Summary:Cln led discussion on perspective. Group discussed how perspective changes can alter our reality and is always something within our control.    Therapist Response: Patient participated and reports understanding of how to reframe. Pt reports changing her perspective regarding her negative thoughts about her mom can be beneficial.        Session Time: 11:00 -12:00  Participation Level:Active  Behavioral Response:CasualAlertDepressed  Type of Therapy: Group Therapy, OT  Treatment Goals addressed: Coping  Interventions:Psychosocial skills training, Supportive,   Summary:Occupational Therapy group  Therapist Response: Patient engaged in group. See OT note.       Session Time: 12:00- 1:00  Participation Level:Active  Behavioral Response:CasualAlertDepressed  Type of Therapy: Group Therapy, Psychoeducation; Psychotherapy  Treatment Goals addressed: Coping  Interventions:CBT; Solution focused; Supportive; Reframing  Summary:12:00 -12:50Clinician introduced topic of fear setting. Group viewed TED talk "Why you should define your fears instead of your goals."  Group utilized fear setting model to work through a current fear and analyze it. 12:50 -1:00 Clinician led check-out. Clinician assessed for immediate needs, medication compliance and efficacy, and safety concerns   Therapist  Response:Patient engaged activity and discussion. Pt completed fear setting model on current fear of being out in public and gained insight over  the issue.   At checkout, pt rates her mood at [place] on a scale of 1-10 with 10 being great. Patient reports afternoon plans ofgoing to the grocery store and cooking dinner for her family. Patient demonstrates some progress as evidenced by setting a boundary with her mom.Patient denies SI/HI/self-harm at the end of group.     Suicidal/Homicidal: Nowithout intent/plan  Plan: Pt will continue in PHP while working to stabilize post-inpatient admission, decrease depression symptoms, and increase ability to utilize healthy coping skills when symptoms arise.   Diagnosis: Bipolar 1 disorder, depressed, severe ([place]) [F31.4]    1. Bipolar 1 disorder, depressed, severe ([place])       [name], LCSW 01/07/2019

## 2019-01-07 NOTE — Psych (Signed)
Virtual Visit via Video Note  I connected with Stefanie CritchleyKimberly D Galvan on 01/07/19 at  9:00 AM EDT by a video enabled telemedicine application and verified that I am speaking with the correct person using two identifiers.   I discussed the limitations of evaluation and management by telemedicine and the availability of in person appointments. The patient expressed understanding and agreed to proceed.   I discussed the assessment and treatment plan with the patient. The patient was provided an opportunity to ask questions and all were answered. The patient agreed with the plan and demonstrated an understanding of the instructions.   The patient was advised to call back or seek an in-person evaluation if the symptoms worsen or if the condition fails to improve as anticipated.  Pt was provided 240 minutes of non-face-to-face time during this encounter.   Stefanie GuilesJenny Rennae Ferraiolo, LCSW    [facility] PHP THERAPIST PROGRESS NOTE  Stefanie Galvan [id number]  Session Time: 9:00 - 10:00  Participation Level: Did Not Attend  Behavioral Response: CasualAlertDepressed  Type of Therapy: Group Therapy  Treatment Goals addressed: Coping  Interventions: CBT, DBT, Solution Focused, Supportive and Reframing  Summary: Clinician led check-in regarding current stressors and situation, and review of patient completed daily inventory. Clinician utilized active listening and empathetic response and validated patient emotions. Clinician facilitated processing group on pertinent issues.   Therapist Response:Stefanie Galvan is a 53 y.o. female who presents with depression symptoms. Patient arrived within time allowed and reports that she is feeling "really good." Patient rates her mood at a 8 on a scale of 1-10 with 10 being great. Pt reports she didn't sleep well but it hasn't affected her morning so far. Pt states she cooked again for her family and her mom maintained the boundary pt set. Pt states she was able  to have some good moments with her mom and recognized the efforts mom was making. Pt shares she has begun journaling her experiences with bipolar as a way of processing and it has been helpful. Patient engaged in discussion.     Session Time: 10:00 -11:00  Participation Level:Active  Behavioral Response:CasualAlertDepressed  Type of Therapy: Group Therapy, psychoeducation, psychotherapy  Treatment Goals addressed: Coping  Interventions:CBT, DBT, Solution Focused, Supportive and Reframing  Summary:Clinician introduced topic of feelings. Clinician discussed the way to contextualize feelings as things that come and go and the ability to choose which feelings we attach to. Clinician differentiated between feelings and reaction to feelings and group members discussed how to apply this knowledge in terms of perspective.   Therapist Response:Pt reports understanding of this way to contextualize feelings and the reaction to feelings and how to utilize it to reframe perspective.          Session Time: 11:00 -12:00  Participation Level: Active  Behavioral Response: CasualAlertDepressed  Type of Therapy: Group Therapy, psychotherapy  Treatment Goals addressed: Coping  Interventions: Strengths based, reframing, Supportive,   Summary:  Spiritual Care group  Therapist Response: Patient engaged in group. See chaplain note.        Session Time: 12:00- 1:00  Participation Level: Active  Behavioral Response: CasualAlertDepressed  Type of Therapy: Group Therapy  Treatment Goals addressed: Coping  Interventions: relaxation training; Supportive; Reframing  Summary: 12:00 - 12:50: Relaxation group: Cln led group focused on retraining the body's response to stress.   12:50 -1:00 Clinician led check-out. Clinician assessed for immediate needs, medication compliance and efficacy, and safety concerns   Therapist Response: Patient engaged  in activity  and discussion. At checkout, pt rates her mood at a8.5 on a scale of 1-10 with 10 being great. Patient reports afternoon plans ofdoing a puzzle and relaxing. Patient demonstrates some progress as evidenced by making positive efforts with her mom.Patient denies SI/HI/self-harm at the end of group.     Suicidal/Homicidal: Nowithout intent/plan  Plan: Pt will continue in PHP while working to stabilize post-inpatient admission, decrease depression symptoms, and increase ability to utilize healthy coping skills when symptoms arise.   Diagnosis: Bipolar 1 disorder, depressed, severe ([place]) [F31.4]    1. Bipolar 1 disorder, depressed, severe ([place])       [name], LCSW 01/07/2019

## 2019-01-08 ENCOUNTER — Encounter (HOSPITAL_COMMUNITY): Payer: Self-pay

## 2019-01-08 ENCOUNTER — Ambulatory Visit (HOSPITAL_COMMUNITY): Payer: PRIVATE HEALTH INSURANCE

## 2019-01-08 ENCOUNTER — Other Ambulatory Visit (HOSPITAL_COMMUNITY): Payer: BC Managed Care – PPO | Admitting: Psychiatry

## 2019-01-08 ENCOUNTER — Other Ambulatory Visit (HOSPITAL_COMMUNITY): Payer: PRIVATE HEALTH INSURANCE

## 2019-01-08 ENCOUNTER — Other Ambulatory Visit (HOSPITAL_COMMUNITY): Payer: BC Managed Care – PPO | Admitting: Occupational Therapy

## 2019-01-08 DIAGNOSIS — R4589 Other symptoms and signs involving emotional state: Secondary | ICD-10-CM

## 2019-01-08 DIAGNOSIS — F314 Bipolar disorder, current episode depressed, severe, without psychotic features: Secondary | ICD-10-CM | POA: Diagnosis not present

## 2019-01-08 NOTE — Progress Notes (Signed)
Virtual Visit via Video Note  I connected with Stefanie Galvan on 01/08/19 at  9:00 AM EDT by a video enabled telemedicine application and verified that I am speaking with the correct person using two identifiers.  I discussed the limitations of evaluation and management by telemedicine and the availability of in person appointments. The patient expressed understanding and agreed to proceed.   I discussed the assessment and treatment plan with the patient. The patient was provided an opportunity to ask questions and all were answered. The patient agreed with the plan and demonstrated an understanding of the instructions.   The patient was advised to call back or seek an in-person evaluation if the symptoms worsen or if the condition fails to improve as anticipated.   Pt was provided 240 minutes of non-face-to-face time during this encounter.     Hilbert OdorBethany Malai Lady, LCSW       [facility] PHP THERAPIST PROGRESS NOTE   Stefanie Galvan [id number]   Session Time: 9:00 - 11:00   Participation Level: Fully Engaged   Behavioral Response: CasualAlertEnergetic   Type of Therapy: Group Therapy   Treatment Goals addressed: Coping   Interventions: CBT, DBT, Solution Focused, Supportive and Reframing   Summary: Clinician led check-in regarding current stressors and situation, and review of patient completed daily inventory. Clinician utilized active listening and empathetic response and validated patient emotions. Clinician facilitated processing group on pertinent issues.    Therapist Response: Stefanie CritchleyKimberly D Galvan is a 53 y.o. female who presents with depression symptoms. Patient arrived within time allowed and reports that she is feeling "great." Patient rates her mood at an 8 on a scale of 1-10 with 10 being great. Pt reports continual sleep issues related to increased anxiety at night and lower dose of Seroquel. Will communicate about this on Tuesday with NP if sleep challenges continue.   Despite the sleep issues, she reports being "bushy tailed and chipper" this morning. Pt states she worked on a puzzle, made plans with a friend, relaxed at home and stayed out of the heat. Pt reported again that she is seeing slight progress in her relationship with her mom and recognized the efforts mom was making. Pt shared anxieties about returning to work, securing her short-term disability and how to communicate with her co-workers about her need to be out of work. Pt reported that she is actively applying the following coping skills to reduce symptoms: the float away technique, positive affirmations, and statements of gratitude. Patient engaged in discussion.      Session Time: 11:00 -12:00   Participation Level: Active   Behavioral Response: CasualAlertAnxious   Type of Therapy: Group Therapy, psychotherapy   Treatment Goals addressed: Coping   Interventions: Strengths based, reframing, Supportive, goal setting    Summary:  Occupational Therapy Group   Therapist Response: Patient engaged in group. See OT note.           Session Time: 12:00- 1:00   Participation Level: Active   Behavioral Response: CasualAlertCalm   Type of Therapy: Group Therapy   Treatment Goals addressed: Coping   Interventions: Stress reduction; Supportive; Reframing; Psychoeducation   Summary: 12:00 - 12:50: Clinician provided psychoeducation on impact of work-related stress and strategies to better manage stress in the workplace.  12:50 -1:00 Clinician led check-out. Clinician assessed for immediate needs, medication compliance and efficacy, and safety concerns.     Therapist Response: Patient engaged in activity and discussion. At checkout, pt rates her mood at a 9 on a  scale of 1-10 with 10 being great. Patient reports afternoon plans to meet with a friend and to do a deep clean of her bedroom. Patient demonstrates some progress as evidenced by making positive efforts with her mom. Patient denies  SI/HI/self-harm at the end of group.         Suicidal/Homicidal: No, without intent/plan   Plan: Pt will continue in PHP while working to stabilize post-inpatient admission, decrease depression symptoms, and increase ability to utilize healthy coping skills when symptoms arise.    Diagnosis:      Bipolar 1 disorder, depressed, severe ([place]) [F31.4]                          1. Bipolar 1 disorder, depressed, severe ([place])          Stefanie Auer, LCSW 01/08/2019

## 2019-01-09 ENCOUNTER — Other Ambulatory Visit (HOSPITAL_COMMUNITY): Payer: BC Managed Care – PPO | Admitting: Occupational Therapy

## 2019-01-09 ENCOUNTER — Other Ambulatory Visit (HOSPITAL_COMMUNITY): Payer: PRIVATE HEALTH INSURANCE

## 2019-01-09 ENCOUNTER — Encounter (HOSPITAL_COMMUNITY): Payer: Self-pay

## 2019-01-09 ENCOUNTER — Encounter (HOSPITAL_COMMUNITY): Payer: Self-pay | Admitting: Occupational Therapy

## 2019-01-09 ENCOUNTER — Other Ambulatory Visit (HOSPITAL_COMMUNITY): Payer: BC Managed Care – PPO | Admitting: Psychiatry

## 2019-01-09 ENCOUNTER — Ambulatory Visit (HOSPITAL_COMMUNITY): Payer: PRIVATE HEALTH INSURANCE

## 2019-01-09 ENCOUNTER — Other Ambulatory Visit: Payer: Self-pay

## 2019-01-09 DIAGNOSIS — R4589 Other symptoms and signs involving emotional state: Secondary | ICD-10-CM

## 2019-01-09 DIAGNOSIS — F314 Bipolar disorder, current episode depressed, severe, without psychotic features: Secondary | ICD-10-CM

## 2019-01-09 DIAGNOSIS — F4310 Post-traumatic stress disorder, unspecified: Secondary | ICD-10-CM

## 2019-01-09 NOTE — Therapy (Signed)
[facility] BEHAVIORAL HEALTH PARTIAL HOSPITALIZATION PROGRAM [address] [place], [place], [address] Phone: [phone]   Fax:  [phone]  Occupational Therapy Treatment  Patient Details  Name: Stefanie Galvan MRN: [id number] Date of Birth: 09/21/1965 Referring Provider (OT): Hillery Jacksanika Lewis, NP  Virtual Visit via Video Note  I connected with Stefanie Galvan on 01/08/2019 at  8:00 AM EDT by a video enabled telemedicine application and verified that I am speaking with the correct person using two identifiers.   I discussed the limitations of evaluation and management by telemedicine and the availability of in person appointments. The patient expressed understanding and agreed to proceed.  I discussed the assessment and treatment plan with the patient. The patient was provided an opportunity to ask questions and all were answered. The patient agreed with the plan and demonstrated an understanding of the instructions.   The patient was advised to call back or seek an in-person evaluation if the symptoms worsen or if the condition fails to improve as anticipated.  I provided 60 minutes of non-face-to-face time during this encounter.  Dalphine HandingKaylee Jory Welke, MSOT, OTR/L Behavioral Health OT/ Acute Relief OT PHP Office: [phone]  Dalphine HandingKaylee Terah Robey, [place]    Encounter Date: 01/08/2019  OT End of Session - 01/09/19 1537    Visit Number  2    Number of Visits  12    Date for OT Re-Evaluation  02/04/19    Authorization Type  [employer]    OT Start Time  1100    OT Stop Time  1200    OT Time Calculation (min)  60 min    Activity Tolerance  Patient tolerated treatment well    Behavior During Therapy  [facility] for tasks assessed/performed       Past Medical History:  Diagnosis Date  . Anxiety   . Arthritis   . Depression 2.5 yrs ago   situational dep-no current tx/meds  . TBI (traumatic brain injury) (HCC) 11/10/2005    Past Surgical History:  Procedure Laterality Date  .  BREAST REDUCTION SURGERY  02/25/2012   Procedure: MAMMARY REDUCTION  (BREAST);  Surgeon: Louisa SecondGerald Truesdale, MD;  Location: [facility];  Service: Plastics;  Laterality: Bilateral;  bilateral reduction  . CHOLECYSTECTOMY N/A 07/01/2018   Procedure: LAPAROSCOPIC CHOLECYSTECTOMY;  Surgeon: Sung AmabileSakai, Isami, DO;  Location: [facility];  Service: General;  Laterality: N/A;  . DILATION AND CURETTAGE OF UTERUS  2001  . TEMPOROMANDIBULAR JOINT SURGERY  1984; 1996   X 2; bilateral  . TUBAL LIGATION  2003    There were no vitals filed for this visit.  Subjective Assessment - 01/09/19 1536    Currently in Pain?  No/denies       S: My goals are centered around finding a new relationship  O:Pt given goal identifying worksheet to list immediate, short term, medium term, and long-term goals using a SMART goal framework (specificity, meaningful, adaptive, realistic, and time bound). Goals created as guideline for pt to practice being accountable in various situations. Pt completed work sheet of goals and encouraged to share goals with the group, with emphasis on immediate goal for check in with pt for next session to maintain accountability.   A: Pt presents with blunted affect, engaged and participatory throughout session. Pt shares that her goals are centered around finding a new romantic relationship and her mental health. Pt made very well detailed and appropriate SMART goals for relationships and for detecting manic symptoms as they arise.  P: OT  group will be x3 per week while pt in [place]                 OT Education - 01/09/19 1536    Education Details  education given on goal setting    Person(s) Educated  Patient    Methods  Explanation;Handout    Comprehension  Verbalized understanding       OT Short Term Goals - 01/09/19 1537      OT SHORT TERM GOAL #1   Title  Pt will be educated on strategies to improve psychosocial skills needed to participate fully in all daily,  work, and leisure activities    Time  4    Period  Weeks    Status  On-going    Target Date  02/04/19      OT SHORT TERM GOAL #2   Title  Pt will apply psychosocial skills and coping mechanisms to daily activities in order to function independently and reintegrate into community    Time  4    Period  Weeks    Status  On-going    Target Date  02/04/19      OT SHORT TERM GOAL #3   Title  Pt will engage in goal setting to improve functional BADL/IADL participation and routine prior to reintegrating into community    Time  4    Period  Weeks    Status  On-going    Target Date  02/04/19      OT SHORT TERM GOAL #4   Title  Pt will recall and/or identify 1-3 meaningful job opportunities prior to reintegrating into communtiy    Time  4    Period  Weeks    Status  On-going    Target Date  02/04/19      OT SHORT TERM GOAL #5   Title  Pt will identify potential support system opportunities in the community and improve comfort level in reaching out to idenitifed supports, as needed.    Time  4    Period  Weeks    Status  On-going    Target Date  02/04/19               Plan - 01/09/19 1537    Occupational performance deficits (Please refer to evaluation for details):  ADL's;Work;Social Participation    Cognitive Skills  Emotional;Problem Solve    Psychosocial Skills  Coping Strategies;Environmental  Adaptations;Habits;Interpersonal Interaction;Routines and Behaviors       Patient will benefit from skilled therapeutic intervention in order to improve the following deficits and impairments:     Cognitive Skills: Emotional, Problem Solve Psychosocial Skills: Coping Strategies, Environmental  Adaptations, Habits, Interpersonal Interaction, Routines and Behaviors   Visit Diagnosis: 1. Bipolar 1 disorder, depressed, severe ([place])   2. Difficulty coping       Problem List Patient Active Problem List   Diagnosis Date Noted  . Bipolar 1 disorder, depressed, severe ([place])  12/22/2018  . Generalized anxiety disorder   . MDD (major depressive disorder), recurrent episode, severe ([place]) 12/15/2018  . Biliary colic 40/01/6760  . Bipolar affective ([place]) 09/24/2017  . Lithium overdose 09/23/2017  . Adjustment disorder with mixed disturbance of emotions and conduct   . Lithium overdose, intentional self-harm, initial encounter ([place]) 09/22/2017  . QT prolongation 09/22/2017  . Moderate bipolar I disorder, most recent episode depressed ([place]) 03/19/2017  . Bipolar 2 disorder, major depressive episode ([place]) 03/04/2017    Class: Chronic  . Chronic post-traumatic stress disorder (  PTSD) 03/04/2017    Class: Chronic  . DDD (degenerative disc disease), lumbar 03/04/2017  . Arthritis 08/29/2016   Dalphine HandingKaylee Hibah Odonnell, MSOT, OTR/L Behavioral Health OT/ Acute Relief OT PHP Office: [phone]  Dalphine HandingKaylee Aurelie Dicenzo 01/09/2019, 3:39 PM  [facility] BEHAVIORAL HEALTH PARTIAL HOSPITALIZATION PROGRAM [address] [place], [place], [address] Phone: [phone]   Fax:  [phone]  Name: Stefanie Galvan MRN: [id number] Date of Birth: 11/28/1965

## 2019-01-09 NOTE — Progress Notes (Signed)
Virtual Visit via Video Note  I connected with Stefanie Galvan on 01/09/19 at  9:00 AM EDT by a video enabled telemedicine application and verified that I am speaking with the correct person using two identifiers.  I discussed the limitations of evaluation and management by telemedicine and the availability of in person appointments. The patient expressed understanding and agreed to proceed.   I discussed the assessment and treatment plan with the patient. The patient was provided an opportunity to ask questions and all were answered. The patient agreed with the plan and demonstrated an understanding of the instructions.   The patient was advised to call back or seek an in-person evaluation if the symptoms worsen or if the condition fails to improve as anticipated.   Pt was provided 180 minutes of non-face-to-face time during this encounter.     [name], LCSW       [facility] [place] PHP THERAPIST PROGRESS NOTE   Stefanie Galvan [id number]   Session Time: 9:00 - 10:00   Participation Level: Fully Engaged   Behavioral Response: CasualAlertEnergetic   Type of Therapy: Group Therapy   Treatment Goals addressed: Coping   Interventions: CBT, DBT, Solution Focused, Supportive and Reframing   Summary: Clinician led check-in regarding current stressors and situation, and review of patient completed daily inventory. Clinician utilized active listening and empathetic response and validated patient emotions. Clinician facilitated processing group on pertinent issues.    Therapist Response: Stefanie Galvan is a 53 y.o. female who presents with depression and manic symptoms. Patient arrived within time allowed and reports that she is feeling "really good." Patient rates her mood at an 9 on a scale of 1-10 with 10 being excellent. Pt reports having no depressive symptoms and mild anxiety. Pt reported choosing to do self-care acts yesterday in soaking feet and hands, giving herself a  mani/pedi and deep cleaning her bedroom. Pt reported doing these activities to curb to restlessness and anxiety she experiences in the evenings. Pt reported 5-6 hours of sleep and noted awareness that she has episodes of "talking fast". Pt is concerned that these are signs that the onset of mania is near. Clinician will reached out to NP for guidance regarding medications. NP recommended to stay on current medications as prescribed, because they are still getting in her system to reach full effect. Clinician and Pt identified healthy coping strategies to prevent manic episode until she can be seen by NP on Tuesday. Pt reported applying communication and boundary setting in current relationship and positive outcomes in communications with her mother. Pt additionally shared that she is becoming more aware of family dynamics and how to shield herself from others emotional outbursts. Patient engaged in discussion.      Session Time: 10:00 -11:00   Participation Level: Active   Behavioral Response: CasualAlertCalm   Type of Therapy: Group Therapy, psychotherapy   Treatment Goals addressed: Communication/Relationship Skills   Interventions: Strengths based, reframing, Supportive, Armed forces logistics/support/administrative officer, Self-Assessment, Relationship Skills    Summary:  5 Love Language Assessment   Therapist Response: Patient engaged in group activity. Pt identified that her Love Language is Quality Time and expressed ways she gets this need met in current relationships. Pt. identified that her Love Language has changed overtime, due to her change in values and connecting more with her own needs, vs trying to accommodate other's needs. Clinician shared psychoeducation on Love Languages and shared additional resources for more in-depth understanding and application of assessment results.  Session Time: 11:00 -12:00   Participation Level: Active   Behavioral Response: CasualAlertCalmSad   Type of Therapy: Group  Therapy, psychotherapy   Treatment Goals addressed: Self-Awareness   Interventions: Strengths based, reframing, Supportive, Communication Skills, Self-Assessment   Summary:  Inside-Outside Masks   Therapist Response: Patient engaged in group activity. Pt followed instructions in creating a mask that represented her Internal Self and her External Self, or her dual existence. Pt created an image that represented her "Mania" and her "Content Self". Pt shared the symbolism and reflections on the impact of the activity. Pt. reported gain much from this activity and the activities from the past two days. That she was getting a deeper understanding of herself and how to better manage her mental health. Clinician and group members celebrated this awareness. Around 11:45 during processing Pt. received a call that her father was just taken into Hospice care due to a medical issue. Pt. was visibly upset, crying and distracted with the news, so Clinician gave her permission to tend to her family crisis and that we would follow up with her later in the day. Pt agreed that this would be helpful for her.   In following up, Pt reported feeling safe and motivated to live, that she would be focusing on her family crisis over the weekend. Clinician provided crisis numbers and reviewed safety plan. Pt. plans on continuing in group, pending the condition of her father.   Suicidal/Homicidal: No, without intent/plan   Plan: Pt will continue in PHP while working to stabilize post-inpatient admission, decrease depression symptoms, and increase ability to utilize healthy coping skills when symptoms arise.    Diagnosis:      Bipolar 1 disorder, depressed, severe ([place]) [F31.4]                          1. Bipolar 1 disorder, depressed, severe ([place])          [name], LCSW 01/09/2019

## 2019-01-12 ENCOUNTER — Other Ambulatory Visit (HOSPITAL_COMMUNITY): Payer: BC Managed Care – PPO | Admitting: Psychiatry

## 2019-01-12 ENCOUNTER — Other Ambulatory Visit (HOSPITAL_COMMUNITY): Payer: PRIVATE HEALTH INSURANCE

## 2019-01-12 ENCOUNTER — Other Ambulatory Visit: Payer: Self-pay

## 2019-01-12 ENCOUNTER — Encounter (HOSPITAL_COMMUNITY): Payer: Self-pay

## 2019-01-12 DIAGNOSIS — F4310 Post-traumatic stress disorder, unspecified: Secondary | ICD-10-CM

## 2019-01-12 DIAGNOSIS — R4589 Other symptoms and signs involving emotional state: Secondary | ICD-10-CM

## 2019-01-12 DIAGNOSIS — F314 Bipolar disorder, current episode depressed, severe, without psychotic features: Secondary | ICD-10-CM

## 2019-01-12 NOTE — Progress Notes (Signed)
Virtual Visit via Video Note  I connected with Stefanie Galvan on 01/12/19 at  9:00 AM EDT by a video enabled telemedicine application and verified that I am speaking with the correct person using two identifiers.  I discussed the limitations of evaluation and management by telemedicine and the availability of in person appointments. The patient expressed understanding and agreed to proceed.   I discussed the assessment and treatment plan with the patient. The patient was provided an opportunity to ask questions and all were answered. The patient agreed with the plan and demonstrated an understanding of the instructions.   The patient was advised to call back or seek an in-person evaluation if the symptoms worsen or if the condition fails to improve as anticipated.   Pt was provided 240 minutes of non-face-to-face time during this encounter.     [name], LCSW       [facility] PHP THERAPIST PROGRESS NOTE   Stefanie Galvan [id number]   Session Time: 9:00 - 11:00   Participation Level: Fully Engaged   Behavioral Response: CasualAlertEnergetic   Type of Therapy: Group Therapy   Treatment Goals addressed: Coping   Interventions: CBT, DBT, Solution Focused, Supportive and Reframing   Summary: Clinician led check-in regarding current stressors and situation, and review of patient completed daily inventory. Clinician utilized active listening and empathetic response and validated patient emotions. Clinician facilitated processing group on pertinent issues.    Therapist Response: Stefanie Galvan is a 53 y.o. female who presents with manic and depression symptoms. Patient arrived within time allowed and reports that she is feeling "mixed between manic and depressed." Stating that her depression and anxiety are both moderate, due to dealing with her father's health concerns and end of life decisions, as well as, the feeling of a manic episode developing. Patient rates her  mood at an 6.5 on a scale of 1-10 with 10 being great. Pt reports having difficulty sleeping the past 2 out of 3 nights and having to actively work against her mania symptoms. 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 Pt reported that she is actively applying the following coping skills to reduce symptoms: mindfulness, self-awareness, distraction methods and self-care. Pt identified that she would like to get connected with peer support services and more advocacy around mental health in the community. Patient engaged in discussion.      Session Time: 11:00- 1:00   Participation Level: Active   Behavioral Response: CasualAlertAnxious   Type of Therapy: Group Therapy   Treatment Goals addressed: Coping   Interventions: Stress reduction; Supportive; Reframing; Psychoeducation   Summary: 12:00 - 12:50: Clinician provided psychoeducation on Cognitive Distortions and processed how they impact pts and their mental health symptoms, with particular attention on how to address Cognitive Distortions as they occur internally.   12:50 -1:00 Clinician led check-out. Clinician assessed for immediate needs, medication compliance and efficacy, and safety concerns.     Therapist Response: Patient engaged in activity and discussion. Identifying that she finds herself disqualifying the positive, emotional reasoning, mid reading, all or nothing thinking and catastrophizing. Pt added that she finds herself struggling with fallacy of fairness, personalization and blaming. Pt. shared about cognitive distortions as how it relates to interpersonal relationships and strategies for addressing her  own cognitive distortions to think more rationally and logically. Pt  engaged in discussion  At checkout, pt rates her mood at a 7 on a scale of 1-10 with 10 being great. Pt reports ongoing anxiety and manic symptoms. Clinician spent time assisting the Pt in identifying coping strategies to combat the mania. Pt reported feeling safe. 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         Suicidal/Homicidal: No, without intent/plan   Plan: Pt will continue in PHP while working to stabilize post-inpatient admission, decrease depression symptoms, and increase ability to utilize healthy coping skills when symptoms arise.    Diagnosis:      Bipolar 1 disorder, depressed, severe (HCC) [F31.4]                          1. Bipolar 1 disorder, depressed, severe (HCC)          Hilbert OdorBethany Norleen Xie, LCSW 01/12/2019

## 2019-01-13 ENCOUNTER — Encounter (HOSPITAL_COMMUNITY): Payer: Self-pay | Admitting: Family

## 2019-01-13 ENCOUNTER — Other Ambulatory Visit (HOSPITAL_COMMUNITY): Payer: BC Managed Care – PPO | Admitting: Occupational Therapy

## 2019-01-13 ENCOUNTER — Telehealth (HOSPITAL_COMMUNITY): Payer: Self-pay | Admitting: Professional

## 2019-01-13 ENCOUNTER — Other Ambulatory Visit: Payer: Self-pay

## 2019-01-13 ENCOUNTER — Ambulatory Visit (HOSPITAL_COMMUNITY): Payer: PRIVATE HEALTH INSURANCE

## 2019-01-13 ENCOUNTER — Other Ambulatory Visit (HOSPITAL_COMMUNITY): Payer: PRIVATE HEALTH INSURANCE

## 2019-01-13 ENCOUNTER — Other Ambulatory Visit (HOSPITAL_COMMUNITY): Payer: BC Managed Care – PPO | Admitting: Family

## 2019-01-13 DIAGNOSIS — F314 Bipolar disorder, current episode depressed, severe, without psychotic features: Secondary | ICD-10-CM | POA: Diagnosis not present

## 2019-01-13 DIAGNOSIS — F4310 Post-traumatic stress disorder, unspecified: Secondary | ICD-10-CM

## 2019-01-13 DIAGNOSIS — R4589 Other symptoms and signs involving emotional state: Secondary | ICD-10-CM

## 2019-01-13 MED ORDER — QUETIAPINE FUMARATE 25 MG PO TABS: 25.0000 mg | ORAL_TABLET | Freq: Two times a day (BID) | ORAL | 0 refills | Status: DC

## 2019-01-13 NOTE — Telephone Encounter (Signed)
Cln returned call. Cln told pt what Stefanie Galvan shared with cln: pt filled out STD pw and put Stefanie Galvan name so he would have to sign. Cln told pt she was unsure when Stefanie Galvan would be able to sign. Pt asked if she could do something different; cln told pt she could get a new set of forms faxed to [phone] with attn Stefanie Galvan on it. Once the new set of paperwork is faxed, cln can complete and have Stefanie Galvan sign. Pt agrees. Cln tells pt she needs to complete ROI as well. Pt agrees.

## 2019-01-13 NOTE — Progress Notes (Signed)
Spoke with patient via Webex video call, used 2 identifiers to correctly identify patient. Patient states she is starting to go into a manic episode. Feeling high strung with poor sleep. Last night she walked outside in her sleep. States that she unlocked 2 doors and urinated on her steps. She thought she dreamed it but when she walked her dogs this morning they went right to the spot and sniffed where she had urinated. She knew then that she had not dreamed it. She has notified her family that she is feeling manic and has asked them to help her be aware of things she may do or say. Denies SI/HI or AV hallucinations. On scale of 1-10 as 10 being worst she rates depression at 2 and anxiety at 9. Has been doing some deep breathing today to help her concentrate on not talking to fast during group. She did speak with the NP today and no medication changes have been made. Denies any side effects, no complaints. She states she will continue to monitor her sleep patterns and keep staff informed.

## 2019-01-13 NOTE — Progress Notes (Signed)
Virtual Visit via Video Note  I connected with Stefanie Galvan on 01/14/19 at  9:00 AM EDT by a video enabled telemedicine application and verified that I am speaking with the correct person using two identifiers.   I discussed the limitations of evaluation and management by telemedicine and the availability of in person appointments. The Stefanie expressed understanding and agreed to proceed.   I discussed the assessment and treatment plan with the Stefanie. The Stefanie was provided an opportunity to ask questions and all were answered. The Stefanie agreed with the plan and demonstrated an understanding of the instructions.   The Stefanie was advised to call back or seek an in-person evaluation if the symptoms worsen or if the condition fails to improve as anticipated.  I provided 00 minutes of non-face-to-face time during this encounter.   Oneta Rackanika N Asaph Serena, NP   [facility] OP Progress Note  01/14/2019 9:32 AM Stefanie Galvan  MRN:  [id number]  Chief Complaint: Stefanie BradfordKimberly reports" I feel like my anxiety is through the roof which may cause me to become manic again"  Evaluation Stefanie is awake, alert and oriented x3.  Stated attending daily group session with active and engaged participation.  She recounts worsening anxiety.  Mainly due to running her mother's parents daily.  She reports her concentration has improved since her inpatient admission.  Currently denying suicidal or homicidal ideations.  Denies auditory or visual hallucinations.  Medications was recently adjusted during her inpatient admission as she reports she was discontinued off of Abilify.  Discussed initiating Seroquel 25 mg p.o. twice daily for mood stabilization due to reported "anxiety and manic symptoms". Stefanie reported concerns with "sleep walking" " I urinated outside on last night, I remember everything that happen because my dogs woke me up."  Stefanie was receptive to plan.  Support,encouragement reassurance  was provided.  Visit Diagnosis:    ICD-10-CM   1. Bipolar 1 disorder, depressed, severe (HCC)  F31.4   2. PTSD (post-traumatic stress disorder)  F43.10     Past Psychiatric History:   Past Medical History:  Past Medical History:  Diagnosis Date  . Anxiety   . Arthritis   . Depression 2.5 yrs ago   situational dep-no current tx/meds  . TBI (traumatic brain injury) (HCC) 11/10/2005    Past Surgical History:  Procedure Laterality Date  . BREAST REDUCTION SURGERY  02/25/2012   Procedure: MAMMARY REDUCTION  (BREAST);  Surgeon: Louisa SecondGerald Truesdale, MD;  Location: [facility];  Service: Plastics;  Laterality: Bilateral;  bilateral reduction  . CHOLECYSTECTOMY N/A 07/01/2018   Procedure: LAPAROSCOPIC CHOLECYSTECTOMY;  Surgeon: Sung AmabileSakai, Isami, DO;  Location: [facility];  Service: General;  Laterality: N/A;  . DILATION AND CURETTAGE OF UTERUS  2001  . TEMPOROMANDIBULAR JOINT SURGERY  1984; 1996   X 2; bilateral  . TUBAL LIGATION  2003    Family Psychiatric History:   Family History:  Family History  Problem Relation Age of Onset  . Depression Father   . Anxiety disorder Brother   . Depression Maternal Grandmother     Social History:  Social History   Socioeconomic History  . Marital status: Divorced    Spouse name: Not on file  . Number of children: Not on file  . Years of education: Not on file  . Highest education level: Not on file  Occupational History  . Not on file  Social Needs  . Financial resource strain: Not hard at all  . Food insecurity  Worry: Never true    Inability: Never true  . Transportation needs    Medical: No    Non-medical: No  Tobacco Use  . Smoking status: Never Smoker  . Smokeless tobacco: Never Used  Substance and Sexual Activity  . Alcohol use: Not Currently  . Drug use: No  . Sexual activity: Not Currently  Lifestyle  . Physical activity    Days per week: 0 days    Minutes per session: 0 min  . Stress: Very much   Relationships  . Social connections    Talks on phone: More than three times a week    Gets together: More than three times a week    Attends religious service: 1 to 4 times per year    Active member of club or organization: No    Attends meetings of clubs or organizations: Never    Relationship status: Divorced  Other Topics Concern  . Not on file  Social History Narrative  . Not on file    Allergies:  Allergies  Allergen Reactions  . Anaprox [Naproxen Sodium] Anaphylaxis and Shortness Of Breath    Heart palpitations, shortness of breath, generalized swelling    Metabolic Disorder Labs: Lab Results  Component Value Date   HGBA1C 5.4 12/16/2018   MPG 108.28 12/16/2018   MPG 93.93 03/19/2017   No results found for: PROLACTIN Lab Results  Component Value Date   CHOL 272 (H) 12/16/2018   TRIG 156 (H) 12/16/2018   HDL 42 12/16/2018   CHOLHDL 6.5 12/16/2018   VLDL 31 12/16/2018   LDLCALC 199 (H) 12/16/2018   LDLCALC 179 (H) 03/20/2017   Lab Results  Component Value Date   TSH 1.918 12/16/2018   TSH 5.925 (H) 09/24/2017    Therapeutic Level Labs: Lab Results  Component Value Date   LITHIUM 0.60 09/24/2017   LITHIUM 1.40 (H) 09/23/2017   No results found for: VALPROATE No components found for:  CBMZ  Current Medications: Current Outpatient Medications  Medication Sig Dispense Refill  . escitalopram (LEXAPRO) 5 MG tablet Take 1 tablet (5 mg total) by mouth daily. 30 tablet 0  . lamoTRIgine (LAMICTAL) 200 MG tablet Take 1 tablet (200 mg total) by mouth daily. 30 tablet 0  . QUEtiapine (SEROQUEL) 200 MG tablet Take 200 mg by mouth at bedtime.    Marland Kitchen. QUEtiapine (SEROQUEL) 25 MG tablet Take 1 tablet (25 mg total) by mouth 2 (two) times daily. 60 tablet 0  . ARIPiprazole (ABILIFY) 10 MG tablet Take 1 tablet (10 mg total) by mouth daily. (Stefanie not taking: Reported on 01/06/2019) 30 tablet 0  . cyclobenzaprine (FLEXERIL) 5 MG tablet Take 1 tablet (5 mg total) by mouth  3 (three) times daily. (Stefanie not taking: Reported on 12/24/2018) 45 tablet 0   No current facility-administered medications for this visit.      Musculoskeletal: Strength & Muscle Tone: within normal limits Gait & Station: normal Stefanie leans: N/A  Psychiatric Specialty Exam: ROS  There were no vitals taken for this visit.There is no height or weight on file to calculate BMI.  General Appearance: Casual  Eye Contact:  Good  Speech:  Clear and Coherent  Volume:  Normal  Mood:  Anxious and Depressed  Affect:  Congruent  Thought Process:  Coherent  Orientation:  Full (Time, Place, and Person)  Thought Content: WDL and Logical   Suicidal Thoughts:  No  Homicidal Thoughts:  No  Memory:  Immediate;   Fair Recent;   Fair  Judgement:  Fair  Insight:  Fair and Lacking  Psychomotor Activity:  Normal  Concentration:  Concentration: Fair  Recall:  [employer] of Knowledge: Fair  Language: Fair  Akathisia:  No  Handed:  Right  AIMS (if indicated):   Assets:  Communication Skills Desire for Improvement Resilience Social Support  ADL's:  Intact  Cognition: WNL  Sleep:  Fair   Screenings: AIMS     Admission (Discharged) from OP Visit from 12/15/2018 in [facility] Admission (Discharged) from 09/24/2017 in [facility] [facility]  AIMS Total Score  0  0    AUDIT     Admission (Discharged) from OP Visit from 12/15/2018 in [facility] [place] [facility] Admission (Discharged) from 09/24/2017 in [facility] Admission (Discharged) from OP Visit from 03/19/2017 in [facility] [facility]  Alcohol Use Disorder Identification Test Final Score (AUDIT)  0  1  0    GAD-7     Counselor from 03/29/2017 in [place] Counselor from 03/27/2017 in [place] Counselor from 03/05/2017 in [place]  Total GAD-7 Score  2  5  5     PHQ2-9     Counselor from 12/24/2018 in [place] Counselor from 03/29/2017 in [place] Counselor from 03/27/2017 in [place] [facility] Counselor from 03/15/2017 in [place] Counselor from 03/05/2017 in [place]  PHQ-2 Total Score  6  1  2  1  3   PHQ-9 Total Score  20  7  6  5  7        Assessment and Plan:  Continue partial hospitalization programming Discussed follow-up with primary care for EKG Initiated Seroquel 25 mg p.o. twice daily PRN  Continue Seroquel 200 mg p.o. nightly Continue Lexapro 5 mg p.o. daily Discontinued Abilify 10 mg p.o. daily per [facility]  Treatment plan was reviewed and agreed upon by NPT Stefanie Galvan and Stefanie Galvan need for continued group services   [name], NP 01/14/2019, 9:32 AM

## 2019-01-14 ENCOUNTER — Encounter (HOSPITAL_COMMUNITY): Payer: Self-pay

## 2019-01-14 ENCOUNTER — Other Ambulatory Visit (HOSPITAL_COMMUNITY): Payer: PRIVATE HEALTH INSURANCE

## 2019-01-14 ENCOUNTER — Encounter (HOSPITAL_COMMUNITY): Payer: Self-pay | Admitting: Occupational Therapy

## 2019-01-14 ENCOUNTER — Encounter (HOSPITAL_COMMUNITY): Payer: Self-pay | Admitting: Family

## 2019-01-14 ENCOUNTER — Other Ambulatory Visit (HOSPITAL_COMMUNITY): Payer: BC Managed Care – PPO | Admitting: Psychiatry

## 2019-01-14 DIAGNOSIS — R4589 Other symptoms and signs involving emotional state: Secondary | ICD-10-CM

## 2019-01-14 DIAGNOSIS — F4310 Post-traumatic stress disorder, unspecified: Secondary | ICD-10-CM

## 2019-01-14 DIAGNOSIS — F314 Bipolar disorder, current episode depressed, severe, without psychotic features: Secondary | ICD-10-CM

## 2019-01-14 NOTE — Therapy (Signed)
[facility], [place], [address] Phone: [phone]   Fax:  [phone]  Occupational Therapy Treatment  Patient Details  Name: Stefanie Galvan MRN: [id number] Date of Birth: [birth year] Referring Provider (OT): Ricky Ala, NP  Virtual Visit via Video Note  I connected with Stefanie Galvan on 01/14/19 at  8:00 AM EDT by a video enabled telemedicine application and verified that I am speaking with the correct person using two identifiers.   I discussed the limitations of evaluation and management by telemedicine and the availability of in person appointments. The patient expressed understanding and agreed to proceed.   I discussed the assessment and treatment plan with the patient. The patient was provided an opportunity to ask questions and all were answered. The patient agreed with the plan and demonstrated an understanding of the instructions.   The patient was advised to call back or seek an in-person evaluation if the symptoms worsen or if the condition fails to improve as anticipated.  I provided 60 minutes of non-face-to-face time during this encounter.  Zenovia Jarred, MSOT, OTR/L Behavioral Health OT/ Acute Relief OT PHP Office: [phone]  Zenovia Jarred, [place]    Encounter Date: 01/13/2019  OT End of Session - 01/14/19 1034    Visit Number  3    Number of Visits  12    Date for OT Re-Evaluation  02/04/19    Authorization Type  [employer]    OT Start Time  1100    OT Stop Time  1200    OT Time Calculation (min)  60 min    Activity Tolerance  Patient tolerated treatment well    Behavior During Therapy  [facility] for tasks assessed/performed       Past Medical History:  Diagnosis Date  . Anxiety   . Arthritis   . Depression 2.5 yrs ago   situational dep-no current tx/meds  . TBI (traumatic brain injury) ([employer]) 11/10/2005    Past Surgical History:  Procedure Laterality Date  .  BREAST REDUCTION SURGERY  02/25/2012   Procedure: MAMMARY REDUCTION  (BREAST);  Surgeon: Cristine Polio, MD;  Location: [facility];  Service: Plastics;  Laterality: Bilateral;  bilateral reduction  . CHOLECYSTECTOMY N/A 07/01/2018   Procedure: LAPAROSCOPIC CHOLECYSTECTOMY;  Surgeon: Benjamine Sprague, DO;  Location: [facility];  Service: General;  Laterality: N/A;  . DILATION AND CURETTAGE OF UTERUS  2001  . [place]; 1996   X 2; bilateral  . TUBAL LIGATION  2003    There were no vitals filed for this visit.  Subjective Assessment - 01/14/19 1032    Currently in Pain?  No/denies       S: "I have a hard time communicating at work and with dates"   O: Education given on Herbalist this date. Fair fighting rules discussed in reference to a variety of relationships. Pt asked to apply certain rules to a current situation in life. "I statements" worksheet given to provide education on appropriate communication styles when in emotional situations. Pt asked to apply personal example of I statement at end of session.    A: Pt presents with blunted affect, sharing that she feels as if she is "fighting mania"- no overt signs of mania noted this date. Pt shares how she can apply this topic to work and her romantic life. She shares several examples of how she has been mistreated at work and how she handled it.  Communication education will continue.  P: OT group will be x3 per week while pt in PHP.                 OT Education - 01/14/19 1032    Education Details  education given on communication skills in relationships    Person(s) Educated  Patient    Methods  Explanation;Handout    Comprehension  Verbalized understanding       OT Short Term Goals - 01/09/19 1537      OT SHORT TERM GOAL #1   Title  Pt will be educated on strategies to improve psychosocial skills needed to participate fully in all daily, work, and leisure  activities    Time  4    Period  Weeks    Status  On-going    Target Date  02/04/19      OT SHORT TERM GOAL #2   Title  Pt will apply psychosocial skills and coping mechanisms to daily activities in order to function independently and reintegrate into community    Time  4    Period  Weeks    Status  On-going    Target Date  02/04/19      OT SHORT TERM GOAL #3   Title  Pt will engage in goal setting to improve functional BADL/IADL participation and routine prior to reintegrating into community    Time  4    Period  Weeks    Status  On-going    Target Date  02/04/19      OT SHORT TERM GOAL #4   Title  Pt will recall and/or identify 1-3 meaningful job opportunities prior to reintegrating into communtiy    Time  4    Period  Weeks    Status  On-going    Target Date  02/04/19      OT SHORT TERM GOAL #5   Title  Pt will identify potential support system opportunities in the community and improve comfort level in reaching out to idenitifed supports, as needed.    Time  4    Period  Weeks    Status  On-going    Target Date  02/04/19                 Patient will benefit from skilled therapeutic intervention in order to improve the following deficits and impairments:           Visit Diagnosis: 1. Bipolar 1 disorder, depressed, severe (HCC)   2. PTSD (post-traumatic stress disorder)   3. Difficulty coping       Problem List Patient Active Problem List   Diagnosis Date Noted  . Bipolar 1 disorder, depressed, severe (HCC) 12/22/2018  . Generalized anxiety disorder   . MDD (major depressive disorder), recurrent episode, severe (HCC) 12/15/2018  . Biliary colic 07/01/2018  . Bipolar affective (HCC) 09/24/2017  . Lithium overdose 09/23/2017  . Adjustment disorder with mixed disturbance of emotions and conduct   . Lithium overdose, intentional self-harm, initial encounter (HCC) 09/22/2017  . QT prolongation 09/22/2017  . Moderate bipolar I disorder, most recent  episode depressed (HCC) 03/19/2017  . Bipolar 2 disorder, major depressive episode (HCC) 03/04/2017    Class: Chronic  . Chronic post-traumatic stress disorder (PTSD) 03/04/2017    Class: Chronic  . DDD (degenerative disc disease), lumbar 03/04/2017  . Arthritis 08/29/2016   Stefanie Galvan, MSOT, OTR/L Behavioral Health OT/ Acute Relief OT PHP Office: [phone]  Stefanie Galvan 01/14/2019, 10:35 AM  [facility] [address] [place], [place], [address] Phone: [phone]   Fax:  [phone]  Name: Stefanie Galvan MRN: [id number] Date of Birth: 12/16/1965

## 2019-01-14 NOTE — Therapy (Signed)
[place], [place], [address] Phone: [phone]   Fax:  [phone]  Occupational Therapy Treatment  Patient Details  Name: CATTLEYA DOBRATZ MRN: [id number] Date of Birth: 1965-06-28 Referring Provider (OT): Ricky Ala, NP   Encounter Date: 01/09/2019    Past Medical History:  Diagnosis Date  . Anxiety   . Arthritis   . Depression 2.5 yrs ago   situational dep-no current tx/meds  . TBI (traumatic brain injury) ([place]) 11/10/2005    Past Surgical History:  Procedure Laterality Date  . BREAST REDUCTION SURGERY  02/25/2012   Procedure: MAMMARY REDUCTION  (BREAST);  Surgeon: Cristine Polio, MD;  Location: [facility];  Service: Plastics;  Laterality: Bilateral;  bilateral reduction  . CHOLECYSTECTOMY N/A 07/01/2018   Procedure: LAPAROSCOPIC CHOLECYSTECTOMY;  Surgeon: Benjamine Sprague, DO;  Location: [facility];  Service: General;  Laterality: N/A;  . DILATION AND CURETTAGE OF UTERUS  2001  . Railroad; 1996   X 2; bilateral  . TUBAL LIGATION  2003    There were no vitals filed for this visit.          Pt chose to leave group due to family issues with her dad being ill in the hospital.                OT Short Term Goals - 01/09/19 1537      OT SHORT TERM GOAL #1   Title  Pt will be educated on strategies to improve psychosocial skills needed to participate fully in all daily, work, and leisure activities    Time  4    Period  Weeks    Status  On-going    Target Date  02/04/19      OT SHORT TERM GOAL #2   Title  Pt will apply psychosocial skills and coping mechanisms to daily activities in order to function independently and reintegrate into community    Time  4    Period  Weeks    Status  On-going    Target Date  02/04/19      OT SHORT TERM GOAL #3   Title  Pt will engage in goal setting to improve functional BADL/IADL  participation and routine prior to reintegrating into community    Time  4    Period  Weeks    Status  On-going    Target Date  02/04/19      OT SHORT TERM GOAL #4   Title  Pt will recall and/or identify 1-3 meaningful job opportunities prior to reintegrating into communtiy    Time  4    Period  Weeks    Status  On-going    Target Date  02/04/19      OT SHORT TERM GOAL #5   Title  Pt will identify potential support system opportunities in the community and improve comfort level in reaching out to idenitifed supports, as needed.    Time  4    Period  Weeks    Status  On-going    Target Date  02/04/19                 Patient will benefit from skilled therapeutic intervention in order to improve the following deficits and impairments:           Visit Diagnosis: 1. Bipolar 1 disorder, depressed, severe ([place])   2. Difficulty coping       Problem List  Patient Active Problem List   Diagnosis Date Noted  . Bipolar 1 disorder, depressed, severe (HCC) 12/22/2018  . Generalized anxiety disorder   . MDD (major depressive disorder), recurrent episode, severe (H[facility]) 12/15/2018  . Biliary colic 07/01/2018  . Bipolar affective ([facility]) 09/24/2017  . Lithium overdose 09/23/2017  . Adjustment disorder with mixed disturbance of emotions and conduct   . Lithium overdose, intentional self-harm, initial encounter ([facility]CC) 09/22/2017  . QT prolongation 09/22/2017  . Moderate bipolar I disorder, most recent episode depressed (HCC) 03/19/2017  . Bipolar 2 disorder, major depressive episode (HCC) 03/04/2017    Class: Chronic  . Chronic post-traumatic stress disorder (PTSD) 03/04/2017    Class: Chronic  . DDD (degenerative disc disease), lumbar 03/04/2017  . Arthritis 08/29/2016   Dalphine HandingKaylee Lillianah Swartzentruber, MSOT, OTR/L Behavioral Health OT/ Acute Relief OT PHP Office: [phone]  Dalphine HandingKaylee Ziyon Cedotal 01/14/2019, 10:59 AM  [facility] [address] [place], [place], [address] Phone: [phone]   Fax:  [phone]  Name: Macarthur CritchleyKimberly D Bigbee MRN: [id number] Date of Birth: 10/01/1965

## 2019-01-14 NOTE — Progress Notes (Signed)
Virtual Visit via Video Note  I connected with Macarthur CritchleyKimberly D Menzer on 01/13/19 at  9:00 AM EDT by a video enabled telemedicine application and verified that I am speaking with the correct person using two identifiers.  I discussed the limitations of evaluation and management by telemedicine and the availability of in person appointments. The patient expressed understanding and agreed to proceed.   I discussed the assessment and treatment plan with the patient. The patient was provided an opportunity to ask questions and all were answered. The patient agreed with the plan and demonstrated an understanding of the instructions.   The patient was advised to call back or seek an in-person evaluation if the symptoms worsen or if the condition fails to improve as anticipated.   Pt was provided 240 minutes of non-face-to-face time during this encounter.     Hilbert OdorBethany Jerold Yoss, LCSW       [facility] PHP THERAPIST [facility]GRESS NOTE   Macarthur CritchleyKimberly D Hrdlicka [phone]   Session Time: 9:00 - 11:00   Participation Level: Engaged   Behavioral Response: CasualAlertAnxious   Type of Therapy: Group Therapy   Treatment Goals addressed: Coping   Interventions: CBT, DBT, Solution Focused, Supportive and Reframing   Summary: Clinician led check-in regarding current stressors and situation, and review of patient completed daily inventory. Clinician utilized active listening and empathetic response and validated patient emotions. Clinician facilitated processing group on pertinent issues.    Therapist Response: Macarthur CritchleyKimberly D Williamsen is a 53 y.o. female who presents with depression and manic symptoms. Patient arrived within time allowed and reports that she is feeling "like anxiety is way up there." Patient rates her mood at a 6 on a scale of 1-10 with 10 being great. Pt reported increased manic symptoms over the the past few days has her concerned and anxious about experiencing a "full blown manic episode". Pt is  excited to talk with NP today. She reports having racing thoughts, pressured speech, sleep walking, irritability, aggravation and angry that her body is reacting in this way. Pt reported improvements in her fathers health and a plan for his discharge from the hospital. Pt reported improved communications with her mother and brother. Was able to identify coping skills applied since last session and increased self-awareness. Patient engaged in discussion.       Session Time: 11:00 - 12:00   Participation Level: Active   Behavioral Response: CasualAlertAnxious   Type of Therapy: Group Therapy, psychoeducation, psychotherapy   Treatment Goals addressed: Relaxation and Coping   Interventions: Guided Imagery, Breathing Techniques, Muscle Tension/Relaxation, CBT, Supportive and Reframing   Summary: Clinician read a guided imagery script to participants on Finding Your Authentic Self, which was chosen by group members from a list of topics. It promoted identify work, relaxation, deep breathing, muscle tension/relaxation, and positive affirmations. Clinician processed impact of activity on pts.     Therapist Response: Pt reported enjoying the activity and having a deeper connection with her authentic self. She noted that she no longer wants to mask who she really is, but wants to incorporate her true self in all aspects of her life, including work. Pt engaged in discussion.         Session Time: 12:00 -1:00   Participation Level: Active   Behavioral Response: CasualAlertDepressed   Type of Therapy: Group Therapy, psychotherapy, OT   Treatment Goals addressed: Coping and Communication Skills   Interventions: Strengths based, reframing, supportive, Reframing   Summary: 12:00-12:50 "[employer]" with OT. 12:50 -1:00 Clinician  led check-out. Clinician assessed for immediate needs, medication compliance and efficacy, and safety concerns  Therapist Response: Patient engaged in group. See  OT note. Patient engaged in activity and discussion. At checkout, pt rates her mood at a 7 on a scale of 1-10 with 10 being great. Patient reports being happy about her consult with the NP, feeling better about addressing mania. 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 We identified additional ways to calm her body and mind down at night to reduce restlessness and anxiety. Patient denies SI/HI/self-harm at the end of group.    Suicidal/Homicidal: No, without intent/plan   Plan: Pt will continue in PHP while working to stabilize post-inpatient admission, decrease depression symptoms, and increase ability to utilize healthy coping skills when symptoms arise.    Diagnosis:      Bipolar 1 disorder, depressed, severe ([place]) [F31.4]                          1. Bipolar 1 disorder, depressed, severe ([place])          [name], LCSW 01/13/2019

## 2019-01-14 NOTE — Progress Notes (Signed)
GROUP NOTE - spiritual care group 01/14/2019 11:00 - 12:00 ?Facilitated by Chaplain Romina Divirgilio, MDiv, BCC.  ? ? Group focused on topic of strength. ?Group members reflected on what thoughts and feelings emerge when they hear this topic. ?They then engaged in facilitated dialog around how strength is present in their lives. This dialog focused on representing what strength had been to them in their lives (images and patterns given) and what they saw as helpful in their life now (what they needed / wanted). ? ? Activity drew on narrative framework    

## 2019-01-14 NOTE — Progress Notes (Signed)
Virtual Visit via Video Note  I connected with Stefanie Galvan on 01/14/19 at  9:00 AM EDT by a video enabled telemedicine application and verified that I am speaking with the correct person using two identifiers.  I discussed the limitations of evaluation and management by telemedicine and the availability of in person appointments. The patient expressed understanding and agreed to proceed.   I discussed the assessment and treatment plan with the patient. The patient was provided an opportunity to ask questions and all were answered. The patient agreed with the plan and demonstrated an understanding of the instructions.   The patient was advised to call back or seek an in-person evaluation if the symptoms worsen or if the condition fails to improve as anticipated.   Pt was provided 240 minutes of non-face-to-face time during this encounter.     [name], LCSW       [facility] PHP THERAPIST PROGRESS NOTE   TIANNAH GREENLY [id number]   Session Time: 9:00 - 11:00   Participation Level: Engaged   Behavioral Response: CasualAlertCalm   Type of Therapy: Group Therapy   Treatment Goals addressed: Coping   Interventions: CBT, DBT, Solution Focused, Supportive and Reframing   Summary: Clinician led check-in regarding current stressors and situation, and review of patient completed daily inventory. Clinician utilized active listening and empathetic response and validated patient emotions. Clinician facilitated processing group on pertinent issues.    Therapist Response: Stefanie Galvan is a 53 y.o. female who presents with depression and manic symptoms. Patient arrived within time allowed and reports that she is feeling "in a really good place this morning." Patient rates her mood at a 8 on a scale of 1-10 with 10 being great. Pt reports feeling positive about the medication increase yesterday. She shared that she had a productive day yesterday and was able to sleep 7 hours  last night, compared to the 4-5 she's been getting recently. Pt practiced self-care this morning and reported being able to fit into a smaller pair of jeans, which is evidence of progress. She reported relationship at home continuing to improve and that she was unable to give plasma, due to recent hospital stay. Patient engaged in discussion.      Session Time: 11:00 -12:00   Participation Level: Active   Behavioral Response: CasualAlertAnxious   Type of Therapy: Group Therapy, psychotherapy   Treatment Goals addressed: Coping   Interventions: Strengths based, reframing, Supportive,    Summary:  Spiritual Care group   Therapist Response: Patient engaged in group. See chaplain note.        Session Time: 12:00- 1:00   Participation Level: Active   Behavioral Response: CasualAlertAnxious   Type of Therapy: Group Therapy   Treatment Goals addressed: Coping   Interventions: relaxation training; Supportive; Reframing   Summary: 12:00 - 12:50: Relaxation group: Cln led group focused on retraining the body's response to stress.   12:50 -1:00 Clinician led check-out. Clinician assessed for immediate needs, medication compliance and efficacy, and safety concerns     Therapist Response: Patient engaged in activity and discussion for about 15 minutes, then had to leave group early regarding her father's health/call. She was unable to fully engage in the activity, but reported at checkout, her mood at an 8 on a scale of 1-10 with 10 being great. Patient reports afternoon plans of communicating with her dad and his medical providers, running errands, and engaging in self-care activities. Pt enjoyed getting to express herself creatively and in  movement today. Patient denies SI/HI/self-harm at the end of group.   Suicidal/Homicidal: No, without intent/plan   Plan: Pt will continue in PHP while working to stabilize post-inpatient admission, decrease depression symptoms, and increase ability to  utilize healthy coping skills when symptoms arise.    Diagnosis:      Bipolar 1 disorder, depressed, severe (HCC) [F31.4]                          1. Bipolar 1 disorder, depressed, severe (HCC)          Hilbert OdorBethany Mort Smelser, LCSW 01/14/2019

## 2019-01-15 ENCOUNTER — Other Ambulatory Visit (HOSPITAL_COMMUNITY): Payer: BC Managed Care – PPO | Admitting: Psychiatry

## 2019-01-15 ENCOUNTER — Ambulatory Visit (HOSPITAL_COMMUNITY): Payer: PRIVATE HEALTH INSURANCE

## 2019-01-15 ENCOUNTER — Other Ambulatory Visit (HOSPITAL_COMMUNITY): Payer: PRIVATE HEALTH INSURANCE

## 2019-01-15 ENCOUNTER — Other Ambulatory Visit: Payer: Self-pay

## 2019-01-15 ENCOUNTER — Other Ambulatory Visit (HOSPITAL_COMMUNITY): Payer: BC Managed Care – PPO | Admitting: Occupational Therapy

## 2019-01-15 ENCOUNTER — Encounter (HOSPITAL_COMMUNITY): Payer: Self-pay

## 2019-01-15 DIAGNOSIS — F314 Bipolar disorder, current episode depressed, severe, without psychotic features: Secondary | ICD-10-CM

## 2019-01-15 DIAGNOSIS — F4310 Post-traumatic stress disorder, unspecified: Secondary | ICD-10-CM

## 2019-01-15 DIAGNOSIS — R4589 Other symptoms and signs involving emotional state: Secondary | ICD-10-CM

## 2019-01-15 NOTE — Progress Notes (Signed)
Virtual Visit via Video Note  I connected with Stefanie Galvan on 01/15/19 at  9:00 AM EDT by a video enabled telemedicine application and verified that I am speaking with the correct person using two identifiers.  I discussed the limitations of evaluation and management by telemedicine and the availability of in person appointments. The patient expressed understanding and agreed to proceed.   I discussed the assessment and treatment plan with the patient. The patient was provided an opportunity to ask questions and all were answered. The patient agreed with the plan and demonstrated an understanding of the instructions.   The patient was advised to call back or seek an in-person evaluation if the symptoms worsen or if the condition fails to improve as anticipated.   Pt was provided 240 minutes of non-face-to-face time during this encounter.     [name], LCSW       [facility] PHP THERAPIST PROGRESS NOTE   Stefanie Galvan [id number]   Session Time: 9:00 - 11:00   Participation Level: Engaged   Behavioral Response: CasualAlertCalm   Type of Therapy: Group Therapy   Treatment Goals addressed: Coping   Interventions: CBT, DBT, Solution Focused, Supportive and Reframing   Summary: Clinician led check-in regarding current stressors and situation, and review of patient completed daily inventory. Clinician utilized active listening and empathetic response and validated patient emotions. Clinician facilitated processing group on pertinent issues.    Therapist Response: Stefanie Galvan is a 53 y.o. female who presents with depression and manic symptoms. Patient arrived within time allowed and reports that she is feeling "like I've had a good couple of days." Patient rates her mood at an 8 on a scale of 1-10 with 10 being great. Pt reports feeling Pt reported there have been no-low triggers at home, more understanding of her needs and mental health, things are going well in  her personal life. She reported positive reports about her father's care. Pt reports 7 hours of sleep and a decrease in the manic symptoms, however reports having a "load of energy". Pt reported utilizing coping skills: sleep meditation, self-care and distraction methods. Overall feels medications are working. Patient engaged in discussion.      Session Time: 11:00 -12:00   Participation Level: Active   Behavioral Response: CasualAlertIrritable   Type of Therapy: Group Therapy, psychotherapy   Treatment Goals addressed: Coping   Interventions: Strengths based, reframing, supportive, psychoeducation, communication skills, self-awareness   Summary:  Tips on Communicating about Mental Illness to Others and How Depression and Anxiety Overlap: Avoidance   Therapist Response: Patient engaged in group. Clinician shared psychoeducation using "W.I.S.E." model for sharing about individual's personal mental health information with friends, family, co-workers or strangers. Pt shared examples of how pt could implement this skill. Clinician walked participants through activity to identify the ways in which their anxiety and depression overlap and differ. Clinician shared psychoeducation video on how Avoidance is a symptom of anxiety and depression, with ways to combat this symptom, including coping strategies. Pt connected with the video and visual activity, reporting new realizations in her mental health and how she views it.      Session Time: 12:00- 1:00   Participation Level: Active   Behavioral Response: CasualAlertIrritable   Type of Therapy: Group Therapy   Treatment Goals addressed: Coping   Interventions: Communication skills, empathy, reframing, supportive   Summary: 12:00 - 12:50: OT covered "Active Listening Skills" See OT Note. 12:50 -1:00 OT led check-out. OT assessed for immediate  needs, medication compliance and efficacy, and safety concerns     Therapist Response: Patient  engaged in activity and discussion. Pt rated overall mood at check out as a 6 on a scale of 1-10 with 10 being great. Patient reports irritability, feeling overwhelmed and agitation increasing throughout the session, without identifying a root cause. This afternoon she will prepare for a date and engage in self-care routine. She reports feeling safe. Patient denies SI/HI/self-harm at the end of group.   Suicidal/Homicidal: No, without intent/plan   Plan: Pt will continue in PHP while working to stabilize post-inpatient admission, decrease depression symptoms, and increase ability to utilize healthy coping skills when symptoms arise.    Diagnosis:      Bipolar 1 disorder, depressed, severe (HCC) [F31.4]                          1. Bipolar 1 disorder, depressed, severe (HCC)          Hilbert OdorBethany Nicky Milhouse, LCSW 01/15/2019

## 2019-01-16 ENCOUNTER — Encounter (HOSPITAL_COMMUNITY): Payer: Self-pay

## 2019-01-16 ENCOUNTER — Other Ambulatory Visit (HOSPITAL_COMMUNITY): Payer: PRIVATE HEALTH INSURANCE

## 2019-01-16 ENCOUNTER — Other Ambulatory Visit (HOSPITAL_COMMUNITY): Payer: BC Managed Care – PPO | Admitting: Occupational Therapy

## 2019-01-16 ENCOUNTER — Ambulatory Visit (HOSPITAL_COMMUNITY): Payer: PRIVATE HEALTH INSURANCE

## 2019-01-16 ENCOUNTER — Other Ambulatory Visit: Payer: Self-pay

## 2019-01-16 ENCOUNTER — Other Ambulatory Visit (HOSPITAL_COMMUNITY): Payer: BC Managed Care – PPO | Admitting: Psychiatry

## 2019-01-16 DIAGNOSIS — F4310 Post-traumatic stress disorder, unspecified: Secondary | ICD-10-CM

## 2019-01-16 DIAGNOSIS — F314 Bipolar disorder, current episode depressed, severe, without psychotic features: Secondary | ICD-10-CM

## 2019-01-16 DIAGNOSIS — R4589 Other symptoms and signs involving emotional state: Secondary | ICD-10-CM

## 2019-01-16 NOTE — Progress Notes (Addendum)
Virtual Visit via Video Note  I connected with Stefanie Galvan on 01/16/19 at  9:00 AM EDT by a video enabled telemedicine application and verified that I am speaking with the correct person using two identifiers.  I discussed the limitations of evaluation and management by telemedicine and the availability of in person appointments. The patient expressed understanding and agreed to proceed.   I discussed the assessment and treatment plan with the patient. The patient was provided an opportunity to ask questions and all were answered. The patient agreed with the plan and demonstrated an understanding of the instructions.   The patient was advised to call back or seek an in-person evaluation if the symptoms worsen or if the condition fails to improve as anticipated.   Pt was provided 240 minutes of non-face-to-face time during this encounter.     [name], LCSW       [facility] [place] PHP THERAPIST PROGRESS NOTE   Stefanie Galvan [id number]   Session Time: 9:00 - 11:00   Participation Level: Engaged   Behavioral Response: CasualAlertCalm   Type of Therapy: Group Therapy   Treatment Goals addressed: Coping   Interventions: CBT, DBT, Solution Focused, Supportive and Reframing   Summary: Clinician led check-in regarding current stressors and situation, and review of patient completed daily inventory. Clinician utilized active listening and empathetic response and validated patient emotions. Clinician facilitated processing group on pertinent issues.    Therapist Response: Stefanie Galvan is a 53 y.o. female who presents with depression and manic symptoms. Patient arrived within time allowed and reports feeling "like my agitation is easing up." Patient rates her mood at a 6 on a scale of 1-10 with 10 being great. Pt reported having conflictual feelings, intellectual she can report things are good, but her mood causes her to feel unsure of a specific feeling. Pt reported that  she had a positive dating experience last night. Pt reported "sleeping extremely well". Mild depression, mild anxiety. Pt reports having mixed emotions, feeling the need to have a good cry, and being fixated on a specific task she needs to accomplish. Reported medications changes have been helpful. Patient engaged in discussion.      Session Time: 11:00 -12:00   Participation Level: Active   Behavioral Response: CasualAlertAnxious   Type of Therapy: Group Therapy, psychotherapy   Treatment Goals addressed: Coping   Interventions: Strengths based, reframing, supportive, psychoeducation, communication skills, self-awareness   Summary:  25 Coping Skills to Better Manage Anxiety and Boundary Setting   Therapist Response: Patient engaged in group. Clinician shared psychoeducation on coping strategies to deal with anxiety. Pt reported wanting to try blowing bubbles, journaling with paint, processing and getting set up with a therapist. Clinician provided a Boundary Assessment for pts to take. Clinician processed results. Clinician provided psychoeducation on Boundary Setting. Pt reported having healthy boundaries in dating relationships, but becoming porous with her children, family and on the job. Pt identified ways she wants to improve in this area.      Session Time: 12:00- 1:00   Participation Level: Active   Behavioral Response: CasualAlertIrritable   Type of Therapy: Group Therapy   Treatment Goals addressed: Coping   Interventions: Communication skills, empathy, reframing, supportive   Summary: 12:00 - 12:50: OT covered "Assertiveness". See OT Note. 12:50 -1:00 OT led check-out. OT assessed for immediate needs, medication compliance and efficacy, and safety concerns     Therapist Response: Patient engaged in activity and discussion. Pt rated overall mood at check  out as a 5 on a scale of 1-10 with 10 being great. Patient reports "feeling blah." Pt notices a "mood coming on". Pt  wants to make herself get out of the house and may go on a second date over the weekend. Pt reports feeling safe. Patient denies SI/HI/self-harm at the end of group.   Suicidal/Homicidal: No, without intent/plan   Plan: Pt will continue in PHP while working to stabilize post-inpatient admission, decrease depression symptoms, and increase ability to utilize healthy coping skills when symptoms arise.    Diagnosis:      Bipolar 1 disorder, depressed, severe (HCC) [F31.4]                          1. Bipolar 1 disorder, depressed, severe (HCC)          Stefanie OdorBethany Maximo Spratling, LCSW 01/16/2019

## 2019-01-16 NOTE — Therapy (Signed)
[facility] PART[facility] HOSPITAL[facility] PROGRAM [address] [place], [place], [address] Phone: [phone]   Fax:  [phone]  Occupational Therapy Treatment  Patient Details  Name: Stefanie Galvan MRN: [id number] Date of Birth: 03/10/1966 Referring Provider (OT): Hillery Jacksanika Lewis, NP  Virtual Visit via Video Note  I connected with Stefanie Galvan on 01/16/19 at  8:00 AM EDT by a video enabled telemedicine application and verified that I am speaking with the correct person using two identifiers.   I discussed the limitations of evaluation and management by telemedicine and the availability of in person appointments. The patient expressed understanding and agreed to proceed.  I discussed the assessment and treatment plan with the patient. The patient was provided an opportunity to ask questions and all were answered. The patient agreed with the plan and demonstrated an understanding of the instructions.   The patient was advised to call back or seek an in-person evaluation if the symptoms worsen or if the condition fails to improve as anticipated.  I provided 60 minutes of non-face-to-face time during this encounter.  Dalphine HandingKaylee Reganne Messerschmidt, MSOT, OTR/L Behavioral Health OT/ Acute Relief OT PHP Office: [phone]  Dalphine HandingKaylee Ry [place], [place]     Encounter Date: 01/15/2019  OT End of Session - 01/16/19 2135    Visit Number  4    Number of Visits  12    Date for OT Re-Evaluation  02/04/19    Authorization Type  Medcost    OT Start Time  1100    OT Stop Time  1200    OT Time Calculation (min)  60 min    Activity Tolerance  Patient tolerated treatment well    Behavior During Therapy  [facility] for tasks assessed/performed       Past Medical History:  Diagnosis Date  . Anxiety   . Arthritis   . Depression 2.5 yrs ago   situational dep-no current tx/meds  . TBI (traumatic brain injury) (HCC) 11/10/2005    Past Surgical History:  Procedure Laterality Date  .  BREAST REDUCTION SURGERY  02/25/2012   Procedure: MAMMARY REDUCTION  (BREAST);  Surgeon: Louisa SecondGerald Truesdale, MD;  Location: [facility];  Service: Plastics;  Laterality: Bilateral;  bilateral reduction  . CHOLECYSTECTOMY N/A 07/01/2018   Procedure: LAPAROSCOPIC CHOLECYSTECTOMY;  Surgeon: Sung AmabileSakai, Isami, DO;  Location: [facility];  Service: General;  Laterality: N/A;  . DILATION AND CURETTAGE OF UTERUS  2001  . TEMPOROMANDIBULAR JOINT SURGERY  1984; 1996   X 2; bilateral  . TUBAL LIGATION  2003    There were no vitals filed for this visit.  Subjective Assessment - 01/16/19 2135    Currently in Pain?  No/denies        S: This would be helpful for me at work  O: Education given on active listening skills to apply to daily life situations. Pt given handout and education on 15 active listening skills and encouraged to give examples. After examples, pt asked to state what they can improve/how they can apply to daily life.  A: Pt presents with blunted affect, engaged and participatory throughout session. Pt shares how she could apply active listening skills at work as well in her dating life. She shares how this could help improve her relationships with coworkers. She also given an example of how to use active listening on a date.  P: OT group [facility]  OT Education - 01/16/19 2135    Education Details  education given on active listening skills    Person(s) Educated  Patient    Methods  Explanation;Handout    Comprehension  Verbalized understanding       OT Short Term Goals - 01/09/19 1537      OT SHORT TERM GOAL #1   Title  Pt will be educated on strategies to improve psychosocial skills needed to participate fully in all daily, work, and leisure activities    Time  4    Period  Weeks    Status  On-going    Target Date  02/04/19      OT SHORT TERM GOAL #2   Title  Pt will apply psychosocial skills and coping  mechanisms to daily activities in order to function independently and reintegrate into community    Time  4    Period  Weeks    Status  On-going    Target Date  02/04/19      OT SHORT TERM GOAL #3   Title  Pt will engage in goal setting to improve functional BADL/IADL participation and routine prior to reintegrating into community    Time  4    Period  Weeks    Status  On-going    Target Date  02/04/19      OT SHORT TERM GOAL #4   Title  Pt will recall and/or identify 1-3 meaningful job opportunities prior to reintegrating into communtiy    Time  4    Period  Weeks    Status  On-going    Target Date  02/04/19      OT SHORT TERM GOAL #5   Title  Pt will identify potential support system opportunities in the community and improve comfort level in reaching out to idenitifed supports, as needed.    Time  4    Period  Weeks    Status  On-going    Target Date  02/04/19               Plan - 01/16/19 2136    Occupational performance deficits (Please refer to evaluation for details):  ADL's;Work;Social Participation    Cognitive Skills  Emotional;Problem Solve    Psychosocial Skills  Coping Strategies;Environmental  Adaptations;Habits;Interpersonal Interaction;Routines and Behaviors       Patient will benefit from skilled therapeutic intervention in order to improve the following deficits and impairments:     Cognitive Skills: Emotional, Problem Solve Psychosocial Skills: Coping Strategies, Environmental  Adaptations, Habits, Interpersonal Interaction, Routines and Behaviors   Visit Diagnosis: 1. Bipolar 1 disorder, depressed, severe (HCC)   2. PTSD (post-traumatic stress disorder)       Problem List Patient Active Problem List   Diagnosis Date Noted  . Bipolar 1 disorder, depressed, severe (HCC) 12/22/2018  . Generalized anxiety disorder   . MDD (major depressive disorder), recurrent episode, severe (HCC) 12/15/2018  . Biliary colic 07/01/2018  . Bipolar  affective (HCC) 09/24/2017  . Lithium overdose 09/23/2017  . Adjustment disorder with mixed disturbance of emotions and conduct   . Lithium overdose, intentional self-harm, initial encounter (HCC) 09/22/2017  . QT prolongation 09/22/2017  . Moderate bipolar I disorder, most recent episode depressed (HCC) 03/19/2017  . Bipolar 2 disorder, major depressive episode (HCC) 03/04/2017    Class: Chronic  . Chronic post-traumatic stress disorder (PTSD) 03/04/2017    Class: Chronic  . DDD (degenerative disc disease), lumbar 03/04/2017  . Arthritis 08/29/2016   Dalphine HandingKaylee Rosalva Neary,  MSOT, OTR/L Behavioral Health OT/ Acute Relief OT PHP Office: [place] 01/16/2019, 9:36 PM  [facility] HOSPITALIZATION PROGRAM [place], [place], [address] Phone: [phone]   Fax:  [phone]  Name: Stefanie Galvan MRN: [id number] Date of Birth: [birth year]

## 2019-01-16 NOTE — Therapy (Signed)
[place], [place], [address] Phone: [phone]   Fax:  [phone]  Occupational Therapy Treatment  Patient Details  Name: Stefanie Galvan MRN: [id number] Date of Birth: 1965-07-07 Referring Provider (OT): Ricky Ala, NP  Virtual Visit via Video Note  I connected with Stefanie Galvan on 01/16/19 at  8:00 AM EDT by a video enabled telemedicine application and verified that I am speaking with the correct person using two identifiers.   I discussed the limitations of evaluation and management by telemedicine and the availability of in person appointments. The patient expressed understanding and agreed to proceed.   I discussed the assessment and treatment plan with the patient. The patient was provided an opportunity to ask questions and all were answered. The patient agreed with the plan and demonstrated an understanding of the instructions.   The patient was advised to call back or seek an in-person evaluation if the symptoms worsen or if the condition fails to improve as anticipated.  I provided 60 minutes of non-face-to-face time during this encounter.  Zenovia Jarred, MSOT, OTR/L Behavioral Health OT/ Acute Relief OT PHP Office: [phone]  Zenovia Jarred, [place]    Encounter Date: 01/16/2019  OT End of Session - 01/16/19 2228    Visit Number  5    Number of Visits  12    Date for OT Re-Evaluation  02/04/19    Authorization Type  Medcost    OT Start Time  1100    OT Stop Time  1200    OT Time Calculation (min)  60 min    Activity Tolerance  Patient tolerated treatment well    Behavior During Therapy  [facility] for tasks assessed/performed       Past Medical History:  Diagnosis Date  . Anxiety   . Arthritis   . Depression 2.5 yrs ago   situational dep-no current tx/meds  . TBI (traumatic brain injury) ([place]) 11/10/2005    Past Surgical History:  Procedure Laterality Date  .  BREAST REDUCTION SURGERY  02/25/2012   Procedure: MAMMARY REDUCTION  (BREAST);  Surgeon: Cristine Polio, MD;  Location: [facility];  Service: Plastics;  Laterality: Bilateral;  bilateral reduction  . CHOLECYSTECTOMY N/A 07/01/2018   Procedure: LAPAROSCOPIC CHOLECYSTECTOMY;  Surgeon: Benjamine Sprague, DO;  Location: [facility];  Service: General;  Laterality: N/A;  . DILATION AND CURETTAGE OF UTERUS  2001  . [place]; 1996   X 2; bilateral  . TUBAL LIGATION  2003    There were no vitals filed for this visit.  Subjective Assessment - 01/16/19 2228    Currently in Pain?  No/denies       S: I am more passive aggressive    O: Assertiveness ice breaker performed by pt in pairs to use assertiveness skills to get partner to complete an action.?Assertiveness self rating scale given to increase insight on pt current skills and how to improve based on given score. Further education given on assertive conversation, situations, body language, and appropriate context for skill.?Education and worksheet given to identify three definitions (assertive, passive, and aggressive)?with scenarios to identify assertive, passive, and aggressive with verbal and written examples. Education given on assertiveness techniques and effective assertiveness communication to apply to daily life when reintegrating into community. Pt asked to identify one area in which they would like to be more assertive.   A: Pt presents with blunted affect, engaged and participatory throughout session. Pt  shares how she is more passive aggressive at baseline. She mentions how she needs to better apply assertiveness to her work and when communicating to her mom.  P: OT group wil be x3 per week while pt in PHP                 OT Education - 01/16/19 2228    Education Details  education given on assertivenes    Person(s) Educated  Patient    Methods  Explanation;Handout    Comprehension   Verbalized understanding       OT Short Term Goals - 01/09/19 1537      OT SHORT TERM GOAL #1   Title  Pt will be educated on strategies to improve psychosocial skills needed to participate fully in all daily, work, and leisure activities    Time  4    Period  Weeks    Status  On-going    Target Date  02/04/19      OT SHORT TERM GOAL #2   Title  Pt will apply psychosocial skills and coping mechanisms to daily activities in order to function independently and reintegrate into community    Time  4    Period  Weeks    Status  On-going    Target Date  02/04/19      OT SHORT TERM GOAL #3   Title  Pt will engage in goal setting to improve functional BADL/IADL participation and routine prior to reintegrating into community    Time  4    Period  Weeks    Status  On-going    Target Date  02/04/19      OT SHORT TERM GOAL #4   Title  Pt will recall and/or identify 1-3 meaningful job opportunities prior to reintegrating into communtiy    Time  4    Period  Weeks    Status  On-going    Target Date  02/04/19      OT SHORT TERM GOAL #5   Title  Pt will identify potential support system opportunities in the community and improve comfort level in reaching out to idenitifed supports, as needed.    Time  4    Period  Weeks    Status  On-going    Target Date  02/04/19               Plan - 01/16/19 2228    Occupational performance deficits (Please refer to evaluation for details):  ADL's;Work;Social Participation    Cognitive Skills  Emotional;Problem Solve    Psychosocial Skills  Coping Strategies;Environmental  Adaptations;Habits;Interpersonal Interaction;Routines and Behaviors       Patient will benefit from skilled therapeutic intervention in order to improve the following deficits and impairments:     Cognitive Skills: Emotional, Problem Solve Psychosocial Skills: Coping Strategies, Environmental  Adaptations, Habits, Interpersonal Interaction, Routines and  Behaviors   Visit Diagnosis: 1. Bipolar 1 disorder, depressed, severe (HCC)   2. PTSD (post-traumatic stress disorder)   3. Difficulty coping       Problem List Patient Active Problem List   Diagnosis Date Noted  . Bipolar 1 disorder, depressed, severe (HCC) 12/22/2018  . Generalized anxiety disorder   . MDD (major depressive disorder), recurrent episode, severe (HCC) 12/15/2018  . Biliary colic 07/01/2018  . Bipolar affective (HCC) 09/24/2017  . Lithium overdose 09/23/2017  . Adjustment disorder with mixed disturbance of emotions and conduct   . Lithium overdose, intentional self-harm, initial encounter (HCC) 09/22/2017  .  QT prolongation 09/22/2017  . Moderate bipolar I disorder, most recent episode depressed (HCC) 03/19/2017  . Bipolar 2 disorder, major depressive episode (HCC) 03/04/2017    Class: Chronic  . Chronic post-traumatic stress disorder (PTSD) 03/04/2017    Class: Chronic  . DDD (degenerative disc disease), lumbar 03/04/2017  . Arthritis 08/29/2016   Dalphine HandingKaylee Ivy Meriwether, MSOT, OTR/L Behavioral Health OT/ Acute Relief OT PHP Office: [phone]  Dalphine HandingKaylee Darrielle Pflieger 01/16/2019, 10:29 PM  [facility] H[facility]PITAL[facility]ATION PROGRAM [address] [place], [place], [address] Phone: [phone]   Fax:  [phone]  Name: Macarthur CritchleyKimberly D Canizares MRN: [id number] Date of Birth: 06/14/1966

## 2019-01-19 ENCOUNTER — Other Ambulatory Visit (HOSPITAL_COMMUNITY): Payer: PRIVATE HEALTH INSURANCE

## 2019-01-19 ENCOUNTER — Other Ambulatory Visit: Payer: Self-pay

## 2019-01-19 ENCOUNTER — Encounter (HOSPITAL_COMMUNITY): Payer: Self-pay | Admitting: Family

## 2019-01-19 ENCOUNTER — Other Ambulatory Visit (HOSPITAL_COMMUNITY): Payer: Self-pay | Attending: Orthopedic Surgery | Admitting: Family

## 2019-01-19 DIAGNOSIS — Z79899 Other long term (current) drug therapy: Secondary | ICD-10-CM | POA: Insufficient documentation

## 2019-01-19 DIAGNOSIS — F4310 Post-traumatic stress disorder, unspecified: Secondary | ICD-10-CM | POA: Insufficient documentation

## 2019-01-19 DIAGNOSIS — Z818 Family history of other mental and behavioral disorders: Secondary | ICD-10-CM | POA: Insufficient documentation

## 2019-01-19 DIAGNOSIS — Z87820 Personal history of traumatic brain injury: Secondary | ICD-10-CM | POA: Insufficient documentation

## 2019-01-19 DIAGNOSIS — F314 Bipolar disorder, current episode depressed, severe, without psychotic features: Secondary | ICD-10-CM | POA: Insufficient documentation

## 2019-01-19 DIAGNOSIS — Z886 Allergy status to analgesic agent status: Secondary | ICD-10-CM | POA: Insufficient documentation

## 2019-01-19 DIAGNOSIS — F419 Anxiety disorder, unspecified: Secondary | ICD-10-CM | POA: Insufficient documentation

## 2019-01-19 NOTE — Progress Notes (Signed)
Virtual Visit via Video Note  I connected with Stefanie Galvan on 01/19/19 at  9:00 AM EDT by a video enabled telemedicine application and verified that I am speaking with the correct person using two identifiers.   I discussed the limitations of evaluation and management by telemedicine and the availability of in person appointments. The patient expressed understanding and agreed to proceed.   I discussed the assessment and treatment plan with the patient. The patient was provided an opportunity to ask questions and all were answered. The patient agreed with the plan and demonstrated an understanding of the instructions.   The patient was advised to call back or seek an in-person evaluation if the symptoms worsen or if the condition fails to improve as anticipated.  I provided 00  minutes of non-face-to-face time during this encounter.   [name], NP   [facility] Intensive Outpatient Program Discharge Summary  Stefanie Galvan [id number]  Admission date: 12/23/2018 Discharge date: 01/20/2019  Reason for admission: per admission assessment note: [name]  is a 53 y.o. Caucasian female presents with worsening depression and increased anxiety related to multiple stressors.  Patient recently discharged from inpatient admission due to suicidal ideations.  Patient reports she is currently followed by Dr. Adele Schilder with diagnosis of bipolar, depression and anxiety.  Reports feeling distraught as she has not been able to follow-up with the therapist since 1 year prior as she was followed by [name].  Reports she is currently employed by [employer]  [employer]) who she reports is now requiring increased sales demand she reports minimal compensation.  Patient reports the lack of "consistency "is stressing her out.  Reports she is recently applied for different positions.  States she currently resides with her mother and brother and has noticed unfair  treatment by her mother.  Patient reports favoritism towards her brother.  Which causes her to feel sad and depressed.  Stefanie Galvan reports financially she is unable to live on her own. She stated she has been divorce for the past 5+ years with 3 children.  Reports a good relationship between her children.  Stated her children currently reside throughout [place] and/or [place] and appear to be doing well.  States her youngest son who was 39 recently had a DUI.  However she has been supportive throughout this time for him.  Patient was enrolled in partial psychiatric program on 12/23/18.  Chemical Use History:  Denied   Family of Origin Issues: Reports multiple stressors related to her family dynamics between she and her mother and brother.  Patient reports feeling overworked and stressed related to living in her mother's home.  Reports a working relationship between she and her mother that has good and bad days.  Progress in Program Toward Treatment Goals: Ongoing, patient attended and participated in daily group sessions with active and engaged participation.  Recently admitted herself inpatient hospitalization due to reports of worsening mood and passive suicidal ideations.  Continues to report mood irritability however states that has improved since initiating Seroquel 25 mg p.o. twice daily.  Patient to continue medications as directed.  Progress (rationale):  Patient is stepping down to IOP  Take all medications as prescribed. Keep all follow-up appointments as scheduled.  Do not consume alcohol or use illegal drugs while on prescription medications. Report any adverse effects from your medications to your primary care provider promptly.  In the event of recurrent symptoms or worsening symptoms, call 911, a crisis hotline, or go  to the nearest emergency department for evaluation.   Oneta Rackanika N Sowmya Partridge, NP 01/19/2019

## 2019-01-19 NOTE — Progress Notes (Signed)
Virtual Visit via Video Note  I connected with Stefanie Galvan on 01/19/19 at  9:00 AM EDT by a video enabled telemedicine application and verified that I am speaking with the correct person using two identifiers.  I discussed the limitations of evaluation and management by telemedicine and the availability of in person appointments. The patient expressed understanding and agreed to proceed.   I discussed the assessment and treatment plan with the patient. The patient was provided an opportunity to ask questions and all were answered. The patient agreed with the plan and demonstrated an understanding of the instructions.   The patient was advised to call back or seek an in-person evaluation if the symptoms worsen or if the condition fails to improve as anticipated.   Pt was provided 240 minutes of non-face-to-face time during this encounter.     [name], LCSW       [facility] PHP THERAPIST PROGRESS NOTE   Stefanie Galvan [id number]   Session Time: 9:00 - 11:00   Participation Level: Engaged   Behavioral Response: CasualAlertAnxious   Type of Therapy: Group Therapy   Treatment Goals addressed: Coping   Interventions: CBT, DBT, Solution Focused, Supportive and Reframing   Summary: Clinician led check-in regarding current stressors and situation, and review of patient completed daily inventory. Clinician utilized active listening and empathetic response and validated patient emotions. Clinician facilitated processing group on pertinent issues.    Therapist Response: Stefanie Galvan is a 53 y.o. female who presents with depression and manic symptoms. Patient arrived within time allowed and reports feeling "blah still." Patient rates her mood at a 5 on a scale of 1-10 with 10 being great. Pt reports that she is not experiencing depression or anxiety currently and can identify that overall things are ok. Pt reports continual health issues and concerns of her father who  lives out of state. She reports feeling numb. Clinician discussed the grief and loss cycle and she could identify with the first 2 stages. Feeling helpless in the situation. Pt reported consistently better sleep, at 7 hours per night. Pt shared ways she has improved her healthy sleep habits before bed. Pt engaged in self-care and reported having a good weekend. Patient engaged in discussion.      Session Time: 11:00 -12:45   Participation Level: Active   Behavioral Response: CasualAlertCalm   Type of Therapy: Group Therapy, psychotherapy   Treatment Goals addressed: Coping   Interventions: Strengths based, reframing, supportive, psychoeducation, communication skills, self-awareness, relaxation   Summary: Breathing Techniques for Relaxation and Self-Compassion Knowledge and Skills   Therapist Response: Patient engaged in group. Clinician shared psychoeducation on 8 different breathing techniques to reduce anxiety and increase relaxation. Clinician led group in practicing breathing techniques. Patients shared their feedback on the experience of each skill. Clinician presented materials on Self-Compassion, assessing baseline knowledge and application of the concept. Clinician facilitated the practice of self-soothing in relation to self-compassion. Pt reported enjoying the breathing exercises, feeling deeply relaxed with many of the techniques. Pt shared her understanding of self-compassion, asked questions and engaged in the self-soothing exercises, with the ability to share how she can apply the skills in everyday life.      Session Time: 12:45- 1:00   Participation Level: Active   Behavioral Response: CasualAlertAnxious   Type of Therapy: Group Therapy   Treatment Goals addressed: Coping   Interventions: Communication skills, empathy, reframing, supportive   Summary: Clinician assessed for immediate needs, medication compliance and efficacy, and safety  concerns   Therapist Response:  Patient engaged in activity and discussion. Pt rated overall mood at check out at 6 on a scale of 1-10 with 10 being great. Patient reports "feeling not so blah and more relaxed." Pt plans to lay down, set her alarm for 30 minutes and mediate, allowing herself to express her emotions if needed. Pt reported finding group beneficial today.  Pt reports feeling safe. Patient denies SI/HI/self-harm at the end of group.   Suicidal/Homicidal: No, without intent/plan   Plan: Pt will continue in PHP while working to stabilize post-inpatient admission, decrease depression symptoms, and increase ability to utilize healthy coping skills when symptoms arise.    Diagnosis:      Bipolar 1 disorder, depressed, severe (HCC) [F31.4]                          1. Bipolar 1 disorder, depressed, severe (HCC)          Hilbert OdorBethany Xaivier Malay, LCSW 01/19/2019

## 2019-01-20 ENCOUNTER — Other Ambulatory Visit (HOSPITAL_COMMUNITY): Payer: Self-pay | Admitting: Occupational Therapy

## 2019-01-20 ENCOUNTER — Other Ambulatory Visit (HOSPITAL_COMMUNITY): Payer: PRIVATE HEALTH INSURANCE

## 2019-01-20 ENCOUNTER — Other Ambulatory Visit (HOSPITAL_COMMUNITY): Payer: Self-pay | Admitting: Psychiatry

## 2019-01-20 ENCOUNTER — Encounter (HOSPITAL_COMMUNITY): Payer: Self-pay | Admitting: Psychiatry

## 2019-01-20 ENCOUNTER — Ambulatory Visit (HOSPITAL_COMMUNITY): Payer: PRIVATE HEALTH INSURANCE

## 2019-01-20 ENCOUNTER — Encounter (HOSPITAL_COMMUNITY): Payer: Self-pay | Admitting: Family

## 2019-01-20 ENCOUNTER — Other Ambulatory Visit: Payer: Self-pay

## 2019-01-20 DIAGNOSIS — F4310 Post-traumatic stress disorder, unspecified: Secondary | ICD-10-CM

## 2019-01-20 DIAGNOSIS — F314 Bipolar disorder, current episode depressed, severe, without psychotic features: Secondary | ICD-10-CM

## 2019-01-20 DIAGNOSIS — R4589 Other symptoms and signs involving emotional state: Secondary | ICD-10-CM

## 2019-01-20 NOTE — Progress Notes (Signed)
Virtual Visit via Video Note  I connected with Stefanie Galvan on 01/20/19 at  9:00 AM EDT by a video enabled telemedicine application and verified that I am speaking with the correct person using two identifiers.  I discussed the limitations of evaluation and management by telemedicine and the availability of in person appointments. The patient expressed understanding and agreed to proceed.   I discussed the assessment and treatment plan with the patient. The patient was provided an opportunity to ask questions and all were answered. The patient agreed with the plan and demonstrated an understanding of the instructions.   The patient was advised to call back or seek an in-person evaluation if the symptoms worsen or if the condition fails to improve as anticipated.   Pt was provided 240 minutes of non-face-to-face time during this encounter.     [name], LCSW       [facility] PHP THERAPIST PROGRESS NOTE   SYANA DEGRAFFENREID [id number]   Session Time: 9:00 - 11:00   Participation Level: Engaged   Behavioral Response: CasualAlertAnxious   Type of Therapy: Group Therapy   Treatment Goals addressed: Coping   Interventions: CBT, DBT, Solution Focused, Supportive and Reframing   Summary: Clinician led check-in regarding current stressors and situation, and review of patient completed daily inventory. Clinician utilized active listening and empathetic response and validated patient emotions. Clinician facilitated processing group on pertinent issues.    Therapist Response: Stefanie Galvan is a 53 y.o. female who presents with depression and manic symptoms. Patient arrived within time allowed and reports feeling "pissed off" Patient rates her mood at a 4.5 on a scale of 1-10 with 10 being great. Pt reported that she was fired from her job yesterday. Pt was visibly upset while sharing this information, reporting that she was totally caught off guard by the communications from  her former employer. Clinician offered empathy and supportive statements, along with other group members. Pt reported that she was tempted to call the afterhours crisis line because she felt hopeless and had intrusive thoughts of suicidal ideations, however she was able to connect with her support system and to put a "plan B" into action with applying for unemployment and seeking different employment. Pt reported 4-5 hours of sleep with high anxiety overnight. Pt reported sleep walking. Pt shared progress in her relationship with her mother, as her mom saw her crying this morning, sat beside her, gave her a big hug and shared encouraging and supportive statements. Pt reported being very thankful for this gesture from her mom. Patient engaged in discussion.      Session Time: 11:00 -12:10  Participation Level: Active   Behavioral Response: CasualAlertCalm   Type of Therapy: Group Therapy, psychotherapy   Treatment Goals addressed: Coping   Interventions: Strengths based, reframing, supportive, psychoeducation, communication skills, self-awareness, relaxation   Summary: 11-11:45: Self-Compassion Exercises and 11:45-12:10: Celebrating PHP Progress and Graduates   Therapist Response: Patient engaged in group. Clinician reviewed information on Self-Compassion from yesterday. Clinician engaged group members in 3 Self-Compassion Exercises. Clinician shared a video to summarize Self-Compassion and provide more exercises. Clinician gathered feedback from patients on how they can apply the skills. Pt plans to use the labeling and feeling emotions in her body exercise to handle current situation with work. Clinician prompted patients to write reflections on progress in treatment thus far and encouraging words they would like to share with the two graduates. Clinician allowed each to share progress and statements to group members.  Pt reported making much progress in PHP, especially in her personal relationships,  communication skills, and self-awareness. Pt shared encouragement and appreciation to others in the group. Pt reported feeling very supported and heard during her treatment.     Session Time: 12:10- 1:15   Participation Level: Active   Behavioral Response: CasualAlertAnxious   Type of Therapy: Group Therapy   Treatment Goals addressed: Coping   Interventions: Communication skills, empathy, reframing, supportive/OT Services   Summary: 12:10-1:00: OT covered Self-Care. See OT Note 1:00-1:15: Clinician assessed for immediate needs, medication compliance and efficacy, and safety concerns   Therapist Response: Patient engaged in activity and discussion. Pt rated overall mood at check out at 7 on a scale of 1-10 with 10 being great. Patient reports feeling "like I've come out of a hole." Pt shared thanks with the group members for helping her see her employment situation in a more hopeful light. Pt reported having a great experience in group. Pt plans to finish up her paperwork today and looks forward to starting IOP treatment in the morning.  Pt reports feeling safe and has had no SI since briefly after she was fired yesterday. Patient denies SI/HI/self-harm at the end of group.  Suicidal/Homicidal: No, without intent/plan   Plan: Pt will continue in PHP while working to stabilize post-inpatient admission, decrease depression symptoms, and increase ability to utilize healthy coping skills when symptoms arise.    Diagnosis:      Bipolar 1 disorder, depressed, severe (HCC) [F31.4]                          1. Bipolar 1 disorder, depressed, severe (HCC)          Hilbert OdorBethany Severus Brodzinski, LCSW 01/20/2019

## 2019-01-20 NOTE — Progress Notes (Signed)
Spoke with patient via Webex video call, used 2 identifiers to correctly identify patient. Bright affect, smiling but states she had a rough morning. She was fired from her job by [employer]. She was very upset about it because the reason they gave is that she was a no call no show and she has been communicating with them about her treatment the entire time. She feels that something better will come along and is trying not to worry too much about it. States she got her mania under control but did sleep walk again last night. She woke up in her hallway and had drank a small amount of Pepto-bismol. Discussed the need to lock up medications so that she doesn't take an unintentional overdose. She agreed. Groups are going well and she "loves it." Feels they help her tremendously like a medication would. She starts IOP tomorrow. Denies SI/HI or AV hallucinations. Yesterday she felt worthless but only for a moment after being fired. On scale 1-10 as 10 being worst she rates depression at 6 and anxiety at 4. No issues or complaints.

## 2019-01-21 ENCOUNTER — Other Ambulatory Visit: Payer: Self-pay

## 2019-01-21 ENCOUNTER — Encounter (HOSPITAL_COMMUNITY): Payer: Self-pay | Admitting: Psychiatry

## 2019-01-21 ENCOUNTER — Other Ambulatory Visit (HOSPITAL_COMMUNITY): Payer: Self-pay | Admitting: Psychiatry

## 2019-01-21 ENCOUNTER — Encounter (HOSPITAL_COMMUNITY): Payer: Self-pay | Admitting: Occupational Therapy

## 2019-01-21 ENCOUNTER — Other Ambulatory Visit (HOSPITAL_COMMUNITY): Payer: BC Managed Care – PPO

## 2019-01-21 ENCOUNTER — Other Ambulatory Visit (HOSPITAL_COMMUNITY): Payer: PRIVATE HEALTH INSURANCE

## 2019-01-21 DIAGNOSIS — F4310 Post-traumatic stress disorder, unspecified: Secondary | ICD-10-CM

## 2019-01-21 DIAGNOSIS — F314 Bipolar disorder, current episode depressed, severe, without psychotic features: Secondary | ICD-10-CM

## 2019-01-21 NOTE — Progress Notes (Signed)
Virtual Visit via Video Note  I connected with Stefanie Galvan on 01/23/19 at  9:00 AM EDT by a video enabled telemedicine application and verified that I am speaking with the correct person using two identifiers.   I discussed the limitations of evaluation and management by telemedicine and the availability of in person appointments. The patient expressed understanding and agreed to proceed.   I discussed the assessment and treatment plan with the patient. The patient was provided an opportunity to ask questions and all were answered. The patient agreed with the plan and demonstrated an understanding of the instructions.   The patient was advised to call back or seek an in-person evaluation if the symptoms worsen or if the condition fails to improve as anticipated.  I provided 30 minutes of non-face-to-face time during this encounter.   Stefanie Rackanika N Lewis, NP    Psychiatric Initial Adult Assessment   Patient Identification: Stefanie Galvan MRN:  [id number] Date of Evaluation:  01/23/2019 Referral Source: PHP Chief Complaint:   Chief Complaint    Depression; Anxiety    Depression  Visit Diagnosis:    ICD-10-CM   1. Bipolar 1 disorder, depressed, severe (HCC)  F31.4   2. PTSD (post-traumatic stress disorder)  F43.10     History of Present Illness:  per admission assessment note: Stefanie SnowKimberly Lineberryis a 53 y.o. Caucasianfemale presents with worsening depressionand increased anxiety related to multiple stressors. Patient recently discharged from inpatient admission due to suicidal ideations. Patient reports she is currently followed by Dr. [name] with diagnosis of bipolar, depression and anxiety. Reports feeling distraught as she has not been able to follow-up with the therapist since 1 year prior as she was followed by Boneta LucksJennifer Brown. 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 Reports she is recently applied for different positions. States she currently resides with her mother and brother and has noticed unfair treatment by her mother. Patient reports favoritism towards her brother. Which causes her to feel sad and depressed. Stefanie Galvan reports financially she is unable to live on her own. She stated she has beendivorce for the past 5+ yearswith 3 children. Reports a good relationship between her children.Stated her children currently reside throughout [place] and/or [place] and appear to be doing well. States her youngest son who was [id number] recently had a DUI. However she has been supportive throughout this time for him  Evaluation: Stefanie Galvan recent completed partial hospitalization programming.  Prior to admission she was admitted to inpatient facility where she reports medication adjustments to her Seroquel.  She reports overall feeling slightly better.  Continues to recant sleepwalking events throughout the night.  States some PTSD nightmares associated to her sleepwalking events.  Patient was initiated on Seroquel 25 mg p.o. twice daily for reports of worsening anxiety.  She reports her anxiety has improved slightly rating her anxiety 7 out of 10 with 10 being the worst during this assessment.  Denying suicidal or homicidal ideations.  Denies auditory or visual hallucinations.  Patient to step down to intensive outpatient programming IOP on 01/22/2019  Associated Signs/Symptoms: Depression Symptoms:  depressed mood, feelings of worthlessness/guilt, difficulty concentrating, hopelessness, anxiety, panic attacks, (Hypo) Manic Symptoms:  Distractibility, Anxiety Symptoms:  Excessive Worry, Psychotic Symptoms:  Hallucinations: None PTSD Symptoms: NA  Past Psychiatric History:   Previous Psychotropic Medications: Yes   Substance Abuse History in the last 12 months:  No.  Consequences of Substance Abuse: NA  Past Medical History:  Past Medical History:  Diagnosis Date  . Anxiety   . Arthritis   . Depression 2.5 yrs ago   situational dep-no current tx/meds  . TBI (traumatic brain injury) (HCC) 11/10/2005    Past Surgical History:  Procedure Laterality Date  . BREAST REDUCTION SURGERY  02/25/2012   Procedure: MAMMARY REDUCTION  (BREAST);  Surgeon: Louisa SecondGerald Truesdale, MD;  Location: [facility];  Service: Plastics;  Laterality: Bilateral;  bilateral reduction  . CHOLECYSTECTOMY N/A 07/01/2018   Procedure: LAPAROSCOPIC CHOLECYSTECTOMY;  Surgeon: Sung AmabileSakai, Isami, DO;  Location: [facility];  Service: General;  Laterality: N/A;  . DILATION AND CURETTAGE OF UTERUS  2001  . TEMPOROMANDIBULAR JOINT SURGERY  1984; 1996   X 2; bilateral  . TUBAL LIGATION  2003    Family Psychiatric History:   Family History:  Family History  Problem Relation Age of Onset  . Depression Father   . Anxiety disorder Brother   . Depression Maternal Grandmother     Social History:   Social History   Socioeconomic History  . Marital status: Divorced    Spouse name: Not on file  . Number of children: Not on file  . Years of education: Not on file  . Highest education level: Not on file  Occupational History  . Not on file  Social Needs  . Financial resource strain: Not hard at all  . Food insecurity    Worry: Never true    Inability: Never true  . Transportation needs    Medical: No    Non-medical: No  Tobacco Use  . Smoking status: Never Smoker  . Smokeless tobacco: Never Used  Substance and Sexual Activity  . Alcohol use: Not Currently  . Drug use: No  . Sexual activity: Not Currently  Lifestyle  . Physical activity    Days per week: 0 days    Minutes per session: 0 min  . Stress: Very much  Relationships  . Social connections    Talks on phone: More than three times a week    Gets together: More than three times a week    Attends  religious service: 1 to 4 times per year    Active member of club or organization: No    Attends meetings of clubs or organizations: Never    Relationship status: Divorced  Other Topics Concern  . Not on file  Social History Narrative  . Not on file    Additional Social History:   Allergies:   Allergies  Allergen Reactions  . Anaprox [Naproxen Sodium] Anaphylaxis and Shortness Of Breath    Heart palpitations, shortness of breath, generalized swelling    Metabolic Disorder Labs: Lab Results  Component Value Date   HGBA1C 5.4 12/16/2018   MPG 108.28 12/16/2018   MPG 93.93 03/19/2017   No results found for: PROLACTIN Lab Results  Component Value Date   CHOL 272 (H) 12/16/2018   TRIG 156 (H) 12/16/2018   HDL 42 12/16/2018   CHOLHDL 6.5 12/16/2018   VLDL 31 12/16/2018   LDLCALC 199 (H) 12/16/2018   LDLCALC 179 (H) 03/20/2017   Lab Results  Component Value Date   TSH 1.918 12/16/2018    Therapeutic Level Labs: Lab Results  Component Value Date   LITHIUM 0.60 09/24/2017   No results found for: CBMZ No results found for: VALPROATE  Current Medications: Current Outpatient Medications  Medication Sig Dispense Refill  . ARIPiprazole (ABILIFY)  10 MG tablet Take 1 tablet (10 mg total) by mouth daily. 30 tablet 0  . cyclobenzaprine (FLEXERIL) 5 MG tablet Take 1 tablet (5 mg total) by mouth 3 (three) times daily. 45 tablet 0  . escitalopram (LEXAPRO) 5 MG tablet Take 1 tablet (5 mg total) by mouth daily. 30 tablet 0  . lamoTRIgine (LAMICTAL) 200 MG tablet Take 1 tablet (200 mg total) by mouth daily. 30 tablet 0  . QUEtiapine (SEROQUEL) 200 MG tablet Take 200 mg by mouth at bedtime.    Marland Kitchen QUEtiapine (SEROQUEL) 25 MG tablet Take 1 tablet (25 mg total) by mouth 2 (two) times daily. 60 tablet 0   No current facility-administered medications for this visit.     Musculoskeletal:   Psychiatric Specialty Exam: ROS  There were no vitals taken for this visit.There is no  height or weight on file to calculate BMI.  General Appearance: Casual  Eye Contact:  Good  Speech:  Clear and Coherent  Volume:  Normal  Mood:  Anxious and Depressed  Affect:  Congruent  Thought Process:  Coherent  Orientation:  Full (Time, Place, and Person)  Thought Content:  WDL  Suicidal Thoughts:  No  Homicidal Thoughts:  No  Memory:  Immediate;   Fair Recent;   Fair  Judgement:  Fair  Insight:  Fair  Psychomotor Activity:  Normal  Concentration:  Concentration: Fair  Recall:  [employer] of Knowledge:Fair  Language: Fair  Akathisia:  No  Handed:  Left  AIMS (if indicated):  not done  Assets:  Communication Skills Desire for Improvement Resilience Social Support  ADL's:  Intact  Cognition: WNL  Sleep:  Fair   Screenings: AIMS     Admission (Discharged) from OP Visit from 12/15/2018 in [place] [facility] Admission (Discharged) from 09/24/2017 in [place] [facility]  AIMS Total Score  0  0    AUDIT     Admission (Discharged) from OP Visit from 12/15/2018 in [place] [facility] Admission (Discharged) from 09/24/2017 in [place] [facility] Admission (Discharged) from OP Visit from 03/19/2017 in [place] [facility]  Alcohol Use Disorder Identification Test Final Score (AUDIT)  0  1  0    GAD-7     Counselor from 03/29/2017 in [place] Counselor from 03/27/2017 in [place] Counselor from 03/05/2017 in [place]  Total GAD-7 Score  2  5  5     PHQ2-9     Counselor from 12/24/2018 in [place] Counselor from 03/29/2017 in [place] Counselor from 03/27/2017 in [place] Counselor from 03/15/2017 in [place] Counselor from 03/05/2017 in [place]  PHQ-2 Total Score  6  1  2  1  3   PHQ-9 Total Score  20  7  6  5  7       Assessment and Plan:  Admitted to Intensive outpatient programming (IOP) FMLA paper work was completed by treatment team Denied medication refills at this time  Treatment plan was reviewed and agreed upon by NPT Galvan and patient Irving Lubbers need for continued group services   [name], NP 8/7/20208:35 AM

## 2019-01-21 NOTE — Progress Notes (Signed)
Virtual Visit via Video Note  I connected with Stefanie Galvan on 01/21/19 at  9:00 AM EDT by a video enabled telemedicine application and verified that I am speaking with the correct person using two identifiers.  Location: Patient: Stefanie Galvan Provider: [name], LCSW   I discussed the limitations of evaluation and management by telemedicine and the availability of in person appointments. The patient expressed understanding and agreed to proceed.  History of Present Illness: Bipolar 1 and PTSD   Observations/Objective: Case Manager checked in with all participants to review discharge dates, insurance authorizations, work-related documents and needs for the treatment team. Counselor engaged group members in processing current status, mood, experience since last group and concerns. Today's was Stefanie Galvan's first day, so she introduced herself to the group. Stefanie Galvan shared that the past two days had been hard because she was fired from her job and was experiencing angry and irritability. Stefanie Galvan shared that she channeled her energy into doing yard work and contributing to her family by cooking and running errands. Counselor provided group members with a handout entitled, "Who Am I?" and allowed time for them to complete the reflective worksheets. Group members shared responses, connections and challenges with their identity. Stefanie Galvan shared her insights from her responses and opened up about areas she would like to continue developing. Towards the end of processing this activity Stefanie Galvan stated that she had a call she needed to take, as her dad is sick in the hospital. Stefanie Galvan never returned to group. Case manager and Counselor to follow up to ensure her safety and well being.   Assessment and Plan: Counselor recommends that patient remains in IOP treatment to better manage mental health symptoms and continue to address treatment plan goals. Counselor recommends adherence to crisis/safety plan, taking medications as  prescribed and following up with medical professionals if any issues arise.   Follow Up Instructions: Counselor will send Webex link for next session.    I discussed the assessment and treatment plan with the patient. The patient was provided an opportunity to ask questions and all were answered. The patient agreed with the plan and demonstrated an understanding of the instructions.   The patient was advised to call back or seek an in-person evaluation if the symptoms worsen or if the condition fails to improve as anticipated.  I provided 120 minutes of non-face-to-face time during this encounter.   [name], LCSW

## 2019-01-21 NOTE — Progress Notes (Signed)
  Virtual Visit via Video Note  I connected with Stefanie Galvan on 01/21/19 at 0800 by a video enabled telemedicine application and verified that I am speaking with the correct person using two identifiers.  I discussed the limitations of evaluation and management by telemedicine and the availability of in person appointments. The patient expressed understanding and agreed to proceed. I discussed the assessment and treatment plan with the patient. The patient was provided an opportunity to ask questions and all were answered. The patient agreed with the plan and demonstrated an understanding of the instructions.  The patient was advised to call back or seek an in-person evaluation if the symptoms worsen or if the condition fails to improve as anticipated.  I provided 40 minutes of non-face-to-face time during this encounter.   As per previous CCA states: Pt reports to PHP per inpt. Pt was inpt due to SI with plan. Pt has hx of numerous hospitalizations. Pt sees Dr. Adele Schilder for psychiatry. Pt reports the following stressors: 1) Mother- pt lives with mother and reports mother is very critical of patient. Pt states she "feels trapped." Pt wants mother to go to counseling "to figure her stuff out." Pt thinks mother is gathering information to use against her in counseling. Pt states "I know she's gathering ammo to use against me to make it look like she's the victim and not me." Pt also reports "I just want my mom to like me and not what she wants me to be." 2) Work- pt reports she changed jobs to have less stress and more income. Pt reports the job is more stressful than last and doesn't pay what she was told it would. 3) Continued SI: Pt denies intent or plan "but I know eventually, that's how I will die." 4) Medical issues: "I have a lot of medical issues going on." 5) Stepfather recently passed away. Pt had a contentious relationship with stepfather but feels more of mother is less distracted. Pt  continues to display a victimized mentality. Safety planned verbally. Pt agrees to call [facility] Hotline [phone]) if develops plan/intent. Patients Currently Reported Symptoms/Problems: SI, increased depression, increased anxiety; racing thoughts; feelings of hopelessness; feelings of worthlessness; work problems, sleep changes; decrease in appetite; mood swings; low energy; excessive worrying; irritability  Patient transitioned to [place] from [facility] today.  Was in PHP from 12-23-18 thru 01-20-19. Reports continued stressors as being 1)Strained relationships between she, brother and mother.  2) Grief/Loss Issues:  According to pt, her father is dying and has been given 6 days to 6 months to live.  He resides in [place]. Pt admits to on and off SI; denies any today.  Discussed safety options at length with pt.  Pt is able to contract for safety verbally.  Denies HI or A/V hallucinations.  Admits to continued racing thoughts, fatigue, irritability, poor sleep, sadness, anxiety and feelings of hopelessness. Cc: previous notes for hx A:  Re-oriented pt.  Answered all questions.  Pt gave verbal consent for treatment, to release chart information to referred providers and to complete any forms if needed.  Pt also gave consent for attending group virtually d/t COVID-19 social distancing restrictions.  Encouraged online support groups thru [facility].  F/U with Dr. Adele Schilder and Vassie Moselle, [facility].  R:  Pt receptive.   Stefanie Galvan, M.Ed, CNA

## 2019-01-21 NOTE — Therapy (Signed)
[place], [place], [address] Phone: [phone]   Fax:  [phone]  Occupational Therapy Treatment  Patient Details  Name: Stefanie Galvan MRN: [id number] Date of Birth: 1965/12/07 Referring Provider (OT): Ricky Ala, NP  Virtual Visit via Video Note  I connected with Stefanie Galvan on 01/21/19 at  8:00 AM EDT by a video enabled telemedicine application and verified that I am speaking with the correct person using two identifiers.   I discussed the limitations of evaluation and management by telemedicine and the availability of in person appointments. The patient expressed understanding and agreed to proceed.  I discussed the assessment and treatment plan with the patient. The patient was provided an opportunity to ask questions and all were answered. The patient agreed with the plan and demonstrated an understanding of the instructions.   The patient was advised to call back or seek an in-person evaluation if the symptoms worsen or if the condition fails to improve as anticipated.  I provided 60 minutes of non-face-to-face time during this encounter.  Zenovia Jarred, MSOT, OTR/L Behavioral Health OT/ Acute Relief OT PHP Office: [phone]  Zenovia Jarred, [place]    Encounter Date: 01/20/2019  OT End of Session - 01/21/19 1025    Visit Number  6    Number of Visits  12    Date for OT Re-Evaluation  02/04/19    Authorization Type  [employer]    OT Start Time  1100    OT Stop Time  1200    OT Time Calculation (min)  60 min    Activity Tolerance  Patient tolerated treatment well    Behavior During Therapy  [facility] for tasks assessed/performed       Past Medical History:  Diagnosis Date  . Anxiety   . Arthritis   . Depression 2.5 yrs ago   situational dep-no current tx/meds  . TBI (traumatic brain injury) ([place]) 11/10/2005    Past Surgical History:  Procedure Laterality Date  .  BREAST REDUCTION SURGERY  02/25/2012   Procedure: MAMMARY REDUCTION  (BREAST);  Surgeon: Cristine Polio, MD;  Location: [place];  Service: Plastics;  Laterality: Bilateral;  bilateral reduction  . CHOLECYSTECTOMY N/A 07/01/2018   Procedure: LAPAROSCOPIC CHOLECYSTECTOMY;  Surgeon: Benjamine Sprague, DO;  Location: [facility];  Service: General;  Laterality: N/A;  . DILATION AND CURETTAGE OF UTERUS  2001  . [facility]; 1996   X 2; bilateral  . TUBAL LIGATION  2003    There were no vitals filed for this visit.  Subjective Assessment - 01/21/19 1024    Currently in Pain?  No/denies        S: "I need to improve my social self care"   O: Education given on self care and its importance in regular BADL/IADL routine. Pt completed self care assessment to identify areas of strength and weakness. Self care assessments covered areas of physical health, psychological health, spiritual health, and professional health. Pt asked to identifies area of weakness within each area and develop plans for improvement this date. Pt encouraged to brainstorm with other peers to begin goal setting in areas of desired change.   A: Pt presents to group with blunted affect, engaged and participatory throughout session. Pt shares how she would like to improve her social self care, specifically in the area of dating/friendship. She states that she is now at the point of wanting to pursue a romantic  partner, and has been recently going on dates. She would like to continue to improve her communication skills and maintain her anxiety in this area.  P: OT group will be x3 per week while pt in PHP, anticipate d/c this date.                  OT Education - 01/21/19 1025    Education Details  education given on self care    Person(s) Educated  Patient    Methods  Explanation;Handout    Comprehension  Verbalized understanding       OT Short Term Goals - 01/21/19 1026       OT SHORT TERM GOAL #1   Title  Pt will be educated on strategies to improve psychosocial skills needed to participate fully in all daily, work, and leisure activities    Time  4    Period  Weeks    Status  Achieved    Target Date  02/04/19      OT SHORT TERM GOAL #2   Title  Pt will apply psychosocial skills and coping mechanisms to daily activities in order to function independently and reintegrate into community    Time  4    Period  Weeks    Status  Achieved    Target Date  02/04/19      OT SHORT TERM GOAL #3   Title  Pt will engage in goal setting to improve functional BADL/IADL participation and routine prior to reintegrating into community    Time  4    Period  Weeks    Status  Achieved    Target Date  02/04/19      OT SHORT TERM GOAL #4   Title  Pt will recall and/or identify 1-3 meaningful job opportunities prior to reintegrating into communtiy    Time  4    Period  Weeks    Status  Achieved    Target Date  02/04/19      OT SHORT TERM GOAL #5   Title  Pt will identify potential support system opportunities in the community and improve comfort level in reaching out to idenitifed supports, as needed.    Time  4    Period  Weeks    Status  Achieved    Target Date  02/04/19               Plan - 01/21/19 1025    Occupational performance deficits (Please refer to evaluation for details):  ADL's;Work;Social Participation    Cognitive Skills  Emotional;Problem Solve    Psychosocial Skills  Coping Strategies;Environmental  Adaptations;Habits;Interpersonal Interaction;Routines and Behaviors       Patient will benefit from skilled therapeutic intervention in order to improve the following deficits and impairments:     Cognitive Skills: Emotional, Problem Solve Psychosocial Skills: Coping Strategies, Environmental  Adaptations, Habits, Interpersonal Interaction, Routines and Behaviors   Visit Diagnosis: 1. Bipolar 1 disorder, depressed, severe ([place])   2.  PTSD (post-traumatic stress disorder)   3. Difficulty coping       Problem List Patient Active Problem List   Diagnosis Date Noted  . Bipolar 1 disorder, depressed, severe ([place]) 12/22/2018  . Generalized anxiety disorder   . MDD (major depressive disorder), recurrent episode, severe ([place]) 12/15/2018  . Biliary colic 08/65/7846  . Bipolar affective ([place]) 09/24/2017  . Lithium overdose 09/23/2017  . Adjustment disorder with mixed disturbance of emotions and conduct   . Lithium overdose, intentional self-harm, initial encounter (  [place]) 09/22/2017  . QT prolongation 09/22/2017  . Moderate bipolar I disorder, most recent episode depressed ([place]) 03/19/2017  . Bipolar 2 disorder, major depressive episode ([place]) 03/04/2017    Class: Chronic  . Chronic post-traumatic stress disorder (PTSD) 03/04/2017    Class: Chronic  . DDD (degenerative disc disease), lumbar 03/04/2017  . Arthritis 08/29/2016   OCCUPATIONAL THERAPY DISCHARGE SUMMARY  Visits from Start of Care: 6  Current functional level related to goals / functional outcomes: Pt stepping down to IOP level of care for continue MH maintenance.    Remaining deficits: Continuing to implement skills learned   Education / Equipment: Education given on psychosocial skills and coping mechanisms related to BADL/IADL participation and successful reintegration into community.   Plan: Patient agrees to discharge.  Patient goals were met. Patient is being discharged due to meeting the stated rehab goals.  ?????           Zenovia Jarred, MSOT, OTR/L Behavioral Health OT/ Acute Relief OT PHP Office: [place] 01/21/2019, 10:27 AM  [place], [place], [address] Phone: [phone]   Fax:  [phone]  Name: Stefanie Galvan MRN: [id number] Date of Birth: 1965/12/28

## 2019-01-22 ENCOUNTER — Ambulatory Visit (HOSPITAL_COMMUNITY): Payer: PRIVATE HEALTH INSURANCE

## 2019-01-22 ENCOUNTER — Other Ambulatory Visit (HOSPITAL_COMMUNITY): Payer: BC Managed Care – PPO

## 2019-01-22 ENCOUNTER — Other Ambulatory Visit: Payer: Self-pay

## 2019-01-22 ENCOUNTER — Other Ambulatory Visit (HOSPITAL_COMMUNITY): Payer: Self-pay | Admitting: Psychiatry

## 2019-01-22 ENCOUNTER — Encounter (HOSPITAL_COMMUNITY): Payer: Self-pay

## 2019-01-22 ENCOUNTER — Ambulatory Visit (HOSPITAL_COMMUNITY): Payer: BC Managed Care – PPO

## 2019-01-22 ENCOUNTER — Other Ambulatory Visit (HOSPITAL_COMMUNITY): Payer: PRIVATE HEALTH INSURANCE

## 2019-01-22 DIAGNOSIS — F314 Bipolar disorder, current episode depressed, severe, without psychotic features: Secondary | ICD-10-CM

## 2019-01-22 NOTE — Progress Notes (Signed)
Virtual Visit via Video Note  I connected with Stefanie Galvan on 01/22/19 at  9:00 AM EDT by a video enabled telemedicine application and verified that I am speaking with the correct person using two identifiers.  Location: Patient: Stefanie Galvan Provider: [name], LCSW   I discussed the limitations of evaluation and management by telemedicine and the availability of in person appointments. The patient expressed understanding and agreed to proceed.  History of Present Illness: Bipolar 1   Observations/Objective: Case Manager checked in with all participants to review discharge dates, insurance authorizations, work-related documents and needs for the treatment team. Counselor introduced guest speaker, Stefanie Galvan, [place], to facilitate a discussion around Grief and Loss topics. Stefanie Galvan shared about the anticipated loss of her father, the loss of her step-father and the loss of her job. Counselor prompted the group to journal about the G&L issues they would like to further process with their individual therapist. Counselor engaged the group in exploring and processing symptoms, mood, needs and progress thus far in treatment. Stefanie Galvan shared positive news about her fathers recovery. Stefanie Galvan reports being very concerned about her employment situation and how she is weighing her options for income. Counselor provided a psychoeducational video on "The Anxious Brain" by [employer] and discussed takeaways and concepts applicable to how their anxiety presents. Stefanie Galvan shared that she related to the video's content and that she would like to continue training her brain to think and react differently to reduce anxiety. Counselor closed by having each share words of encouragement and acknowledgement to a group member who graduated today.   Assessment and Plan: Counselor recommends that patient remains in IOP treatment to better manage mental health symptoms and continue to address treatment  plan goals. Counselor recommends adherence to crisis/safety plan, taking medications as prescribed and following up with medical professionals if any issues arise.   Follow Up Instructions: Counselor will send Webex link for next session.    I discussed the assessment and treatment plan with the patient. The patient was provided an opportunity to ask questions and all were answered. The patient agreed with the plan and demonstrated an understanding of the instructions.   The patient was advised to call back or seek an in-person evaluation if the symptoms worsen or if the condition fails to improve as anticipated.  I provided 180 minutes of non-face-to-face time during this encounter.   [name], LCSW

## 2019-01-23 ENCOUNTER — Ambulatory Visit (HOSPITAL_COMMUNITY): Payer: BC Managed Care – PPO

## 2019-01-23 ENCOUNTER — Encounter (HOSPITAL_COMMUNITY): Payer: Self-pay | Admitting: Psychiatry

## 2019-01-23 ENCOUNTER — Other Ambulatory Visit: Payer: Self-pay

## 2019-01-23 ENCOUNTER — Other Ambulatory Visit (HOSPITAL_COMMUNITY): Payer: PRIVATE HEALTH INSURANCE

## 2019-01-23 ENCOUNTER — Other Ambulatory Visit (HOSPITAL_COMMUNITY): Payer: Self-pay | Admitting: Family

## 2019-01-23 ENCOUNTER — Other Ambulatory Visit (HOSPITAL_COMMUNITY): Payer: BC Managed Care – PPO

## 2019-01-23 ENCOUNTER — Encounter (HOSPITAL_COMMUNITY): Payer: Self-pay | Admitting: Family

## 2019-01-23 ENCOUNTER — Ambulatory Visit (HOSPITAL_COMMUNITY): Payer: PRIVATE HEALTH INSURANCE

## 2019-01-23 DIAGNOSIS — F314 Bipolar disorder, current episode depressed, severe, without psychotic features: Secondary | ICD-10-CM

## 2019-01-23 DIAGNOSIS — F319 Bipolar disorder, unspecified: Secondary | ICD-10-CM

## 2019-01-23 MED ORDER — LAMOTRIGINE 200 MG PO TABS: 200.0000 mg | ORAL_TABLET | Freq: Every day | ORAL | 0 refills | Status: DC

## 2019-01-23 NOTE — Progress Notes (Signed)
Virtual Visit via Video Note  I connected with Stefanie Galvan on 01/23/19 at  9:00 AM EDT by a video enabled telemedicine application and verified that I am speaking with the correct person using two identifiers.  Location: Patient: Stefanie Galvan Provider: [name], LCSW   I discussed the limitations of evaluation and management by telemedicine and the availability of in person appointments. The patient expressed understanding and agreed to proceed.  History of Present Illness: Bipolar 1 Disorder   Observations/Objective: Case Manager checked in with all participants to review discharge dates, insurance authorizations, work-related documents and needs for the treatment team. Counselor engaged group members in processing current status, mood, experience since last group and concerns. [name] shared about her comical failed  attempt to mow her yard yesterday. [name] shared about her relationship with her children and about her son's upcoming wedding and the speech she's prepared for the reception.  Counselor provided group members with a packet entitled, "Distress Tolerance". Counselor facilitated providing psychoeducation on the concepts in the material, from the STOP Skill, TIPP Skill, Distracting Methods, Acceptance Methods and Grounding Techniques. Group members shared responses, connections and ways they can apply the skills in their daily lives to better manage mental health symptoms. [name] shared that she plans on painting more this weekend, that she would like to use the TIPP skills to manage panic attacks and the 5 senses of grounding herself. Counselor wrapped up group by allowing group members to send off a graduating member with encouraging and positive statements. [name] shared insightful and thoughtful remarks.   Assessment and Plan: Counselor recommends that patient remains in IOP treatment to better manage mental health symptoms and continue to address treatment plan goals. Counselor  recommends adherence to crisis/safety plan, taking medications as prescribed and following up with medical professionals if any issues arise.   Follow Up Instructions: Counselor will send Webex link for next session.    I discussed the assessment and treatment plan with the patient. The patient was provided an opportunity to ask questions and all were answered. The patient agreed with the plan and demonstrated an understanding of the instructions.   The patient was advised to call back or seek an in-person evaluation if the symptoms worsen or if the condition fails to improve as anticipated.  I provided 180 minutes of non-face-to-face time during this encounter.   [name], LCSW

## 2019-01-23 NOTE — Progress Notes (Signed)
Medication refill was provided

## 2019-01-26 ENCOUNTER — Other Ambulatory Visit (HOSPITAL_COMMUNITY): Payer: PRIVATE HEALTH INSURANCE

## 2019-01-26 ENCOUNTER — Other Ambulatory Visit (HOSPITAL_COMMUNITY): Payer: Self-pay | Admitting: Licensed Clinical Social Worker

## 2019-01-26 ENCOUNTER — Other Ambulatory Visit: Payer: Self-pay

## 2019-01-26 ENCOUNTER — Telehealth (HOSPITAL_COMMUNITY): Payer: Self-pay | Admitting: Psychiatry

## 2019-01-26 DIAGNOSIS — F4310 Post-traumatic stress disorder, unspecified: Secondary | ICD-10-CM

## 2019-01-26 DIAGNOSIS — F314 Bipolar disorder, current episode depressed, severe, without psychotic features: Secondary | ICD-10-CM

## 2019-01-26 NOTE — Telephone Encounter (Signed)
D:  Pt contacted case manager inquiring about long term disability; states she and the group leader [name] M) discussed it in group last Friday.  A:  Provided pt with support.  Reiterated to pt that the goal of MH-IOP is to teach pt skills to get her prepared to return back to work.  Also, added that MH-IOP only completes short term disability not long term.  Informed pt if she had any further questions, she would need to discuss with her outpatient providers.  R:  Pt receptive

## 2019-01-26 NOTE — Progress Notes (Signed)
Virtual Visit via Video Note  I connected with Stefanie Galvan on 01/26/19 at  9:00 AM EDT by a video enabled telemedicine application and verified that I am speaking with the correct person using two identifiers.   I discussed the limitations of evaluation and management by telemedicine and the availability of in person appointments. The patient expressed understanding and agreed to proceed.  I discussed the assessment and treatment plan with the patient. The patient was provided an opportunity to ask questions and all were answered. The patient agreed with the plan and demonstrated an understanding of the instructions.   The patient was advised to call back or seek an in-person evaluation if the symptoms worsen or if the condition fails to improve as anticipated.  I provided 180 minutes of non-face-to-face time during this encounter.   Olegario Messier, LCSW     Daily Group Progress Note  Program: IOP  Group Time: 9am-12pm  Participation Level: Active  Behavioral Response: Appropriate  Type of Therapy:  Group Therapy; process group, psycho-educational group  Summary of Progress:  Clinician met with group members, assessing for SI/HI/psychosis and overall level of functioning. Clinician and group members checked in with high lights and struggles over the weekend, and any skills used to address. Clinician and group members discussed family views on mental health and how this effects family dynamics. Clinician and group members discussed strengths, and needs in daily and overall life, including identifying personal physical, emotional, cognitive, and social needs. Clinician and group members processed result of needs not being met, and barriers to getting needs met. Clinician and group members processed pros and cons related to asking for help, and the differences in needs and wants and how this can effect levels of satisfaction. Clinician inquired about self-care for the day.  Clinician provided clients homework to review Instructional Guide for Needs, to be addressed following day.  Stefanie Galvan: Checked in with positive event over the weekend including son getting married and being productive around the yard. Client endorses difficult time around mom due to interactions leading to poor self-talk. Client identified the need to improve unconditional self-love. Client provided supportive feedback to group members on the importance of non-toxic relationships.   Olegario Messier, LCSW

## 2019-01-27 ENCOUNTER — Ambulatory Visit (HOSPITAL_COMMUNITY): Payer: PRIVATE HEALTH INSURANCE

## 2019-01-27 ENCOUNTER — Encounter (HOSPITAL_COMMUNITY): Payer: Self-pay | Admitting: Family

## 2019-01-27 ENCOUNTER — Other Ambulatory Visit: Payer: Self-pay

## 2019-01-27 ENCOUNTER — Other Ambulatory Visit (HOSPITAL_COMMUNITY): Payer: Self-pay | Admitting: Licensed Clinical Social Worker

## 2019-01-27 DIAGNOSIS — F314 Bipolar disorder, current episode depressed, severe, without psychotic features: Secondary | ICD-10-CM

## 2019-01-27 MED ORDER — QUETIAPINE FUMARATE 25 MG PO TABS: 25.0000 mg | ORAL_TABLET | Freq: Two times a day (BID) | ORAL | 0 refills | Status: DC

## 2019-01-27 MED ORDER — QUETIAPINE FUMARATE 200 MG PO TABS: 200.0000 mg | ORAL_TABLET | Freq: Every day | ORAL | 0 refills | Status: DC

## 2019-01-27 NOTE — Progress Notes (Signed)
Virtual Visit via Video Note  I connected with [name] on 01/27/19 at  9:00 AM EDT by a video enabled telemedicine application and verified that I am speaking with the correct person using two identifiers.   I discussed the limitations of evaluation and management by telemedicine and the availability of in person appointments. The patient expressed understanding and agreed to proceed.  I discussed the assessment and treatment plan with the patient. The patient was provided an opportunity to ask questions and all were answered. The patient agreed with the plan and demonstrated an understanding of the instructions.   The patient was advised to call back or seek an in-person evaluation if the symptoms worsen or if the condition fails to improve as anticipated.  I provided 180 minutes of non-face-to-face time during this encounter.   Olegario Messier, LCSW     Daily Group Progress Note  Program: IOP  Group Time: 9am-12pm  Participation Level: Active  Behavioral Response: Appropriate  Type of Therapy:  Group Therapy; process group, psycho-educational group  Summary of Progress:  Clinician met with group members, assessing for SI/HI/psychosis and overall level of functioning. Clinician inquired about successes and roadblocks from previous day and any coping skills attempted. Clinician and group members processed barriers from previous day and related thoughts and feelings. Clinician presented the topic of Self-Compassion utilizing supporting materials from [employer]. Clinician and group members discussed self-worth vs self-esteem and the use of mindfulness, common humanity, and kindness to avoid self judgement and criticism and provide support in difficult situations. Clinician and group members participated in Radical Permission Statements. Clinician actively listened and praised clients use of addressing difficult emotions with acceptance and sitting with uncomfortable  feelings without the need to act. Clinician reminded group members the importance of practicing skills daily to gain mastery, making skills easier to access in difficult situations.  Client engaged in group discussion appropriately. Client identified completing self care tasks from previous day. Client shared frustrating incident with mother this morning before group. Client was able to identify thoughts and feelings related to anger and verbalize concerns if she was unable to manage her anger. Client notes progress toward goals as evidence by attending group despite feeling 'rage' rather than go back to bed and isolate for the day. Client also shows progress toward goal as evidence by utilizing gratitude sill in session. Client shared permission statement related to feeling all feelings, including anger, without guilt.   Olegario Messier, LCSW

## 2019-01-27 NOTE — Progress Notes (Signed)
Medication refilled x 90days.  Patient concerns with disability paperwork. States she was rejected before and is requesting to be written out of work indefinitely due to her mental illness.  Patient to follow-up with attending psychiatrist.

## 2019-01-28 ENCOUNTER — Other Ambulatory Visit: Payer: Self-pay

## 2019-01-28 ENCOUNTER — Other Ambulatory Visit (HOSPITAL_COMMUNITY): Payer: Self-pay | Admitting: Licensed Clinical Social Worker

## 2019-01-28 DIAGNOSIS — F314 Bipolar disorder, current episode depressed, severe, without psychotic features: Secondary | ICD-10-CM

## 2019-01-29 ENCOUNTER — Other Ambulatory Visit (HOSPITAL_COMMUNITY): Payer: Self-pay | Admitting: Licensed Clinical Social Worker

## 2019-01-29 ENCOUNTER — Other Ambulatory Visit: Payer: Self-pay

## 2019-01-29 DIAGNOSIS — F314 Bipolar disorder, current episode depressed, severe, without psychotic features: Secondary | ICD-10-CM
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
## 2019-01-30 ENCOUNTER — Other Ambulatory Visit: Payer: Self-pay

## 2019-01-30 ENCOUNTER — Other Ambulatory Visit (HOSPITAL_COMMUNITY): Payer: Self-pay | Admitting: Licensed Clinical Social Worker

## 2019-01-30 DIAGNOSIS — F314 Bipolar disorder, current episode depressed, severe, without psychotic features: Secondary | ICD-10-CM

## 2019-01-30 DIAGNOSIS — F4310 Post-traumatic stress disorder, unspecified: Secondary | ICD-10-CM

## 2019-02-02 ENCOUNTER — Other Ambulatory Visit (HOSPITAL_COMMUNITY): Payer: Self-pay | Admitting: Psychiatry

## 2019-02-02 ENCOUNTER — Other Ambulatory Visit: Payer: Self-pay

## 2019-02-02 ENCOUNTER — Encounter (HOSPITAL_COMMUNITY): Payer: Self-pay

## 2019-02-02 DIAGNOSIS — F314 Bipolar disorder, current episode depressed, severe, without psychotic features: Secondary | ICD-10-CM

## 2019-02-02 NOTE — Progress Notes (Signed)
Virtual Visit via Video Note  I connected with Stefanie Galvan on 01/29/2019 at  9:00 AM EDT by a video enabled telemedicine application and verified that I am speaking with the correct person using two identifiers.   I discussed the limitations of evaluation and management by telemedicine and the availability of in person appointments. The patient expressed understanding and agreed to proceed.  I discussed the assessment and treatment plan with the patient. The patient was provided an opportunity to ask questions and all were answered. The patient agreed with the plan and demonstrated an understanding of the instructions.   The patient was advised to call back or seek an in-person evaluation if the symptoms worsen or if the condition fails to improve as anticipated.  I provided 180 minutes of non-face-to-face time during this encounter.   Olegario Messier, LCSW     Daily Group Progress Note  Program: IOP  Group Time: 9am-12pm  Participation Level: Active  Behavioral Response: Appropriate  Type of Therapy:  Group Therapy; process group, psycho-educational group  Summary of Progress: Clinician checked in with group members, assessing for SI/HI/psychosis and overall level of functioning. Process group co-facilitated by Mable Fill focusing on grief and losses. Clinician utilized Loaded Questions to practice distress tolerance skills. Clinician and clients discussed addressing personal insecurities, behavior based on avoiding judgement.  Clinician presented the topic of Communication styles. Clinician provided psychoeducational information related to 4 styles of communication. Clinician and group members practiced in session examples of assertive communication. Clinician and group members reviewed and role played DBT Interpersonal Effectiveness Skills.   Summary of Progress: Client notes continuing to work on getting ready for rehearsal wedding over the weekend. Client identified  loss of son to marriage as something to grieve. Client identified wanting to voice her opinion despite possible consequences as something that would make her happy but she is concerns to follow through. Client reports she inially did not accept mental health diagnosis and symptoms but now is open with others and has found honesty helpful.  Olegario Messier, LCSW

## 2019-02-02 NOTE — Progress Notes (Signed)
Virtual Visit via Video Note  I connected with Stefanie Galvan on 02/02/19 at  9:00 AM EDT by a video enabled telemedicine application and verified that I am speaking with the correct person using two identifiers.  Location: Patient: Stefanie Galvan Provider: [name], LCSW   I discussed the limitations of evaluation and management by telemedicine and the availability of in person appointments. The patient expressed understanding and agreed to proceed.  History of Present Illness: Bipolar 1 DO   Observations/Objective: Case Manager checked in with all participants to review discharge dates, insurance authorizations, work-related documents and needs for the treatment team. Counselor engaged group members in processing their weekend experiences, management of mental health, current needs and concerns. Stefanie Galvan shared that participating in her son's wedding over the weekend was a "roller coaster of emotions". Stefanie Galvan shared about the experience and the positive takeaways she realized about her parenting abilities and the culture she created for her children and their friends during childhood. Counselor provided group members with document called Systems developer for Suicide Prevention, walked them through each step, discussed their individual and specific needs/preferences and promoted group members sharing their plan with [place] and support persons. Stefanie Galvan developed her safety plan identifying triggers, coping skills, support people, safety concerns and motivators to live. Counselor shared a video by a medical professional on "The Impact of Workplace Stress and 7 Strategies to Reduce the Impacts." Group members discussed their takeaways and how they can apply strategies in the workplace. Stefanie Galvan is currently unemployed and is deciding whether or not she is able to hold a job at this time due to her chronic and severe mental illness.  Counselor wrapped up group by allowing group  members to share one self-care or productivity activity they planned on engaging in today. Stefanie Galvan reports being extremely tired from her weekend, so she plans to sleep and rest today.  Assessment and Plan: Counselor recommends that patient remains in IOP treatment to better manage mental health symptoms and continue to address treatment plan goals. Counselor recommends adherence to crisis/safety plan, taking medications as prescribed and following up with medical professionals if any issues arise.   Follow Up Instructions: Counselor will send Webex link for next session.    I discussed the assessment and treatment plan with the patient. The patient was provided an opportunity to ask questions and all were answered. The patient agreed with the plan and demonstrated an understanding of the instructions.   The patient was advised to call back or seek an in-person evaluation if the symptoms worsen or if the condition fails to improve as anticipated.  I provided 180 minutes of non-face-to-face time during this encounter.   [name], LCSW

## 2019-02-03 ENCOUNTER — Other Ambulatory Visit: Payer: Self-pay

## 2019-02-03 ENCOUNTER — Encounter (HOSPITAL_COMMUNITY): Payer: Self-pay

## 2019-02-03 ENCOUNTER — Other Ambulatory Visit (HOSPITAL_COMMUNITY): Payer: Self-pay | Admitting: Psychiatry

## 2019-02-03 DIAGNOSIS — F314 Bipolar disorder, current episode depressed, severe, without psychotic features: Secondary | ICD-10-CM

## 2019-02-03 NOTE — Progress Notes (Signed)
Virtual Visit via Video Note  I connected with Stefanie Galvan on 02/03/19 at  9:00 AM EDT by a video enabled telemedicine application and verified that I am speaking with the correct person using two identifiers.  Location: Patient: Stefanie Galvan Provider: [name], LCSW   I discussed the limitations of evaluation and management by telemedicine and the availability of in person appointments. The patient expressed understanding and agreed to proceed.  History of Present Illness: Bipolar 1 DO   Observations/Objective: Case Manager checked in with all participants to review discharge dates, insurance authorizations, work-related documents and needs for the treatment team. Counselor processed current mood and functioning and discussed how participants spent their time since last session. Stefanie Galvan reported that she took a 2.5 hour nap and slept 8 hours overnight, which was "much needed." Stefanie Galvan reported that she had "wild dreams" and we explored what the meaning and symbolisms were in relation to life stressors. Stefanie Galvan reported being concerned because she splurged on clothing items yesterday due to recent weight gain from new medications.   Counselor provided group members with follow up video about how to better manage work-related stress, since many participants identified this as a major contributor to their poor mental health. Counselor prompted group members to share reflections on the suggestions. Stefanie Galvan stated that since she is no longer employed she could apply the concepts to her relationship with her mother, who likes to act as if she is her boss, giving examples. Counselor provided participants with a handout entitled "Stress Exploration", which we completed together to identify stress and protective factors in their lives. Stefanie Galvan identified several life stressors over the past 6 months that have impacted her, causing increased anxiety and depression. Stefanie Galvan also noted a variety of protective  factors and coping skills that have kept her steady. Counselor wrapped up group by allowing group members to send off a graduating member with encouraging and positive statements. [name] connected with the graduate and shared kind sentiments.   Assessment and Plan: Counselor recommends that patient remains in IOP treatment to better manage mental health symptoms and continue to address treatment plan goals. Counselor recommends adherence to crisis/safety plan, taking medications as prescribed and following up with medical professionals if any issues arise.   Follow Up Instructions: Counselor will send Webex link for next session.    I discussed the assessment and treatment plan with the patient. The patient was provided an opportunity to ask questions and all were answered. The patient agreed with the plan and demonstrated an understanding of the instructions.   The patient was advised to call back or seek an in-person evaluation if the symptoms worsen or if the condition fails to improve as anticipated.  I provided 180 minutes of non-face-to-face time during this encounter.   [name], LCSW

## 2019-02-03 NOTE — Progress Notes (Signed)
Virtual Visit via Video Note  I connected with Stefanie Galvan on 01/30/2019 at  9:00 AM EDT by a video enabled telemedicine application and verified that I am speaking with the correct person using two identifiers.   I discussed the limitations of evaluation and management by telemedicine and the availability of in person appointments. The patient expressed understanding and agreed to proceed.  I discussed the assessment and treatment plan with the patient. The patient was provided an opportunity to ask questions and all were answered. The patient agreed with the plan and demonstrated an understanding of the instructions.   The patient was advised to call back or seek an in-person evaluation if the symptoms worsen or if the condition fails to improve as anticipated.  I provided 180 minutes of non-face-to-face time during this encounter.   Olegario Messier, LCSW     Daily Group Progress Note  Program: IOP  Group Time: 9am-12pm  Participation Level: Active  Behavioral Response: Appropriate, Sharing and Motivated  Type of Therapy:  Group Therapy; process group, psycho-educational group  Summary of Progress: Clinician checked in with group members, assessing for SI/HI/psychosis. Clinician and group members discussed self care and stressors from previous day and any practiced skills. Clinician actively [place] client feelings.  Psycho-educational session facilitated by pharmacist. Pharmacist provided education related to types of medications related to symptoms of different diagnosis. Clients were provided with time to ask questions and share experiences related to medications. Clients were provided with time to process experiences with side effects and bias from family members.  Clinician facilitated discussion on SMART goals. Clinician and group members created individual goals. Clinician prompted clients to focus on individual steps to support identified goal.   Stefanie Galvan  completed self care activities preparing for son's wedding. Stefanie Galvan shared the goal of being peer support. Client shared experience with diagnosis and the effects of being off and on medication. Client provided support to other group members. Client shows progress toward goals as evidence by taking all medications as prescribed and maintaining stable mood more days than not and managing emotion intensity when interacting with others.   Olegario Messier, LCSW

## 2019-02-04 ENCOUNTER — Encounter (HOSPITAL_COMMUNITY): Payer: Self-pay

## 2019-02-04 ENCOUNTER — Other Ambulatory Visit (HOSPITAL_COMMUNITY): Payer: Self-pay | Admitting: Psychiatry

## 2019-02-04 ENCOUNTER — Encounter (HOSPITAL_COMMUNITY): Payer: Self-pay | Admitting: Family

## 2019-02-04 ENCOUNTER — Other Ambulatory Visit: Payer: Self-pay

## 2019-02-04 DIAGNOSIS — F314 Bipolar disorder, current episode depressed, severe, without psychotic features: Secondary | ICD-10-CM

## 2019-02-04 NOTE — Progress Notes (Signed)
Virtual Visit via Video Note  I connected with Stefanie Galvan on 02/04/19 at  9:00 AM EDT by a video enabled telemedicine application and verified that I am speaking with the correct person using two identifiers.  Location: Patient: Stefanie Galvan Provider: [name], LCSW   I discussed the limitations of evaluation and management by telemedicine and the availability of in person appointments. The patient expressed understanding and agreed to proceed.  History of Present Illness: Bipolar 1 DO   Observations/Objective: Case Manager checked in with all participants to review discharge dates, insurance authorizations, work-related documents and needs for the treatment team. Counselor processed current mood and functioning and discussed how participants spent their time since last session. Stefanie Galvan shared that she is having a "frumpy day", because she was reminded of a childhood trauma that has her in a "funk". Counselor explored the trauma with her and discussed ways to combat the power those experiences can have over our current state. Stefanie Galvan stated that she has not been able to process it fully with others in the past. Counselor shared therapeutic interventions to try to take the power back from that experience. Stefanie Galvan is interested in trying interventions. Counselor provided group members with handout called "Self-Care Tips and Strategies". Group members followed along, shared input, and chose specific self-care activities they would like to start implementing. Stefanie Galvan states she has an established self-care routine and enjoys the benefits of caring for herself.  Counselor facilitated a guided imagery script entitled, "Self-Esteem Relaxation", which incorporated deep breathing, muscle tension/relaxation, guided imagery and positive affirmations. Stefanie Galvan truly enjoyed and participated in the activity. She would like to record herself reading the script and playing it over and over to improve her self-confidence  and self-esteem. Counselor introduced Agricultural consultant, Demetra Shiner with the [employer] who shared a presentation on "Health and Wellness". Stefanie Galvan engaged in discussion and created health goals.   Assessment and Plan: Counselor recommends that patient remains in IOP treatment to better manage mental health symptoms and continue to address treatment plan goals. Counselor recommends adherence to crisis/safety plan, taking medications as prescribed and following up with medical professionals if any issues arise.   Follow Up Instructions: Counselor will send Webex link for next session.    I discussed the assessment and treatment plan with the patient. The patient was provided an opportunity to ask questions and all were answered. The patient agreed with the plan and demonstrated an understanding of the instructions.   The patient was advised to call back or seek an in-person evaluation if the symptoms worsen or if the condition fails to improve as anticipated.  I provided 180 minutes of non-face-to-face time during this encounter.   [name], LCSW

## 2019-02-04 NOTE — Progress Notes (Signed)
Virtual Visit via Video Note  I connected with Stefanie Galvan on 02/04/19 at  9:00 AM EDT by a video enabled telemedicine application and verified that I am speaking with the correct person using two identifiers.   I discussed the limitations of evaluation and management by telemedicine and the availability of in person appointments. The patient expressed understanding and agreed to proceed.   I discussed the assessment and treatment plan with the patient. The patient was provided an opportunity to ask questions and all were answered. The patient agreed with the plan and demonstrated an understanding of the instructions.   The patient was advised to call back or seek an in-person evaluation if the symptoms worsen or if the condition fails to improve as anticipated.  I provided 00 minutes of non-face-to-face time during this encounter.   [name], NP    Health Intensive Outpatient Program Discharge Summary  Stefanie Galvan [id number]  Admission date: 01/21/2019 Discharge date: 02/09/2019  Reason for admission: per admission assessment note:Stefanie Lineberryis a 53 y.o. Caucasianfemale presents with worsening depressionand increased anxiety related to multiple stressors. Patient recently discharged from inpatient admission due to suicidal ideations. Patient reports she is currently followed by Dr. Adele Schilder with diagnosis of bipolar, depression and anxiety. Reports feeling distraught as she has not been able to follow-up with the therapist since 1 year prior as she was followed by Stefanie Galvan. Reports she is currently employed by [employer]) who she reports is nowrequiring increasedsalesdemand she reports minimal compensation.Patient reports the lack of "consistency"is stressing her out. Reports she is recently applied for different positions. States she currently resides with her mother and brother and has noticed unfair  treatment by her mother. Patient reports favoritism towards her brother. Which causes her to feel sad and depressed. Stefanie Galvan reports financially she is unable to live on her own. She stated she has beendivorce for the past 5+ yearswith 3 children. Reports a good relationship between her children.Stated her children currently reside throughout [place] and/or [place] and appear to be doing well. States her youngest son who was 33 recently had a DUI. However she has been supportive throughout this time for him   Family of Origin Issues: Patient reported her relationship between she and her mother has improved however continues to have different opinions on certain topics as she reports her mother continues to be controlling and overpowering. Reported she has plans to visit her father with her brother on next week and is excited to start working on this relationship.   Progress in Program Toward Treatment Goals:  Ongoing, patient attended and participated with daily group session with active and engaged participation.  She recent completed partial hospitalization programming and intensive outpatient programming.  [name] appears to have a brighter affect on discharge.  Reports she recently started dating and is hopeful that the relationship will work.Denying suicidal or homicidal ideations.  Denies auditory or visual hallucinations.  Progress (rationale):  Keep follow-up with Psychiatrist Stefanie Galvan on Therapist Stefanie Galvan.  Take all medications as prescribed. Keep all follow-up appointments as scheduled.  Do not consume alcohol or use illegal drugs while on prescription medications. Report any adverse effects from your medications to your primary care provider promptly.  In the event of recurrent symptoms or worsening symptoms, call 911, a crisis hotline, or go to the nearest emergency department for evaluation.   [name], NP 02/04/2019

## 2019-02-05 ENCOUNTER — Telehealth (HOSPITAL_COMMUNITY): Payer: Self-pay | Admitting: Psychiatry

## 2019-02-05 ENCOUNTER — Encounter (HOSPITAL_COMMUNITY): Payer: Self-pay | Admitting: Psychiatry

## 2019-02-05 ENCOUNTER — Other Ambulatory Visit (HOSPITAL_COMMUNITY): Payer: Self-pay | Admitting: Psychiatry

## 2019-02-05 ENCOUNTER — Other Ambulatory Visit: Payer: Self-pay

## 2019-02-05 DIAGNOSIS — F314 Bipolar disorder, current episode depressed, severe, without psychotic features: Secondary | ICD-10-CM

## 2019-02-05 NOTE — Progress Notes (Signed)
Virtual Visit via Video Note  I connected with Stefanie Galvan on 02/05/19 at  9:00 AM EDT by a video enabled telemedicine application and verified that I am speaking with the correct person using two identifiers.  Location: Patient: Stefanie Galvan Provider: [name], LCSW   I discussed the limitations of evaluation and management by telemedicine and the availability of in person appointments. The patient expressed understanding and agreed to proceed.  History of Present Illness: Bipolar 1 DO    Observations/Objective: Case Manager checked in with all participants to review discharge dates, insurance authorizations, work-related documents and needs for the treatment team. Counselor introduced guest speaker, Jeanella Craze, [place], to facilitate a discussion around Grief and Loss topics. [name] shared about her relationship with her son changing because he was recently married as well as the relationship dynamics with her father who is chronically ill. Counselor prompted the group to journal about the G&L issues they would like to further process with their individual therapist. Counselor engaged the group in exploring and processing symptoms, mood, needs and progress thus far in treatment. [name] reported that she was very active yesterday, cleaning her son's home inside and outside. She applied self-care by dying her hair a new color. She reported feeling re-energized after the talk with the Wellness instructor yesterday in Group. Counselor introduced Field seismologist, Jan Fireman, Yoga Instructor, to guide the group in a yoga practice. Counselor checked in with all participants to assess the benefits. [name] felt motivated to go for a walk after Yoga. Counselor closed by having each share what their plans are for the evening and how they can implement self-care. [name] plans to exercise and rest.   Assessment and Plan: Counselor recommends that patient remains in IOP treatment to better  manage mental health symptoms and continue to address treatment plan goals. Counselor recommends adherence to crisis/safety plan, taking medications as prescribed and following up with medical professionals if any issues arise.   Follow Up Instructions: Counselor will send Webex link for next session.    I discussed the assessment and treatment plan with the patient. The patient was provided an opportunity to ask questions and all were answered. The patient agreed with the plan and demonstrated an understanding of the instructions.   The patient was advised to call back or seek an in-person evaluation if the symptoms worsen or if the condition fails to improve as anticipated.  I provided 180 minutes of non-face-to-face time during this encounter.   [name], LCSW

## 2019-02-05 NOTE — Patient Instructions (Signed)
D:  Patient will complete MH-IOP tomorrow.  A:   Discharge on 02-06-19.  Follow up with Dr. Adele Schilder on 02-16-19 @ 11 a.m and Micheline Chapman, LCSW on 02-17-19 @ 3pm.  Encouraged support groups.  R:  Patient is receptive.

## 2019-02-05 NOTE — Telephone Encounter (Signed)
D:  Stefanie Galvan (front desk) brought to case manager's attention that pt's insurance has been terminated.  A:  Placed call to pt to make her aware.  According to pt, she was told that the insurance is good until the end of this month.  Case manager strongly encouraged pt to contact HR to inquire and to call writer back as soon as possible.  R:  Pt receptive.

## 2019-02-06 ENCOUNTER — Other Ambulatory Visit: Payer: Self-pay

## 2019-02-06 ENCOUNTER — Other Ambulatory Visit (HOSPITAL_COMMUNITY): Payer: Self-pay | Admitting: Psychiatry

## 2019-02-06 NOTE — Progress Notes (Signed)
  Virtual Visit via Video Note  I connected with Stefanie Galvan on 02/06/19 at 0800 by a video enabled telemedicine application and verified that I am speaking with the correct person using two identifiers.   I discussed the limitations of evaluation and management by telemedicine and the availability of in person appointments. The patient expressed understanding and agreed to proceed.  I discussed the assessment and treatment plan with the patient. The patient was provided an opportunity to ask questions and all were answered. The patient agreed with the plan and demonstrated an understanding of the instructions.   The patient was advised to call back or seek an in-person evaluation if the symptoms worsen or if the condition fails to improve as anticipated.  I provided 20 minutes of non-face-to-face time during this encounter.   , RITA, M.Ed, CNA  As per previous CCA states: Pt reports to [facility] per inpt. Pt was inpt due to SI with plan. Pt has hx of numerous hospitalizations. Pt sees Dr. Adele Schilder for psychiatry. Pt reports the following stressors: 1) Mother- pt lives with mother and reports mother is very critical of patient. Pt states she "feels trapped."Pt wants mother to go to counseling "to figure her stuff out."Pt thinks mother is gathering information to use against her in counseling. Pt states "I know she's gathering ammo to use against me to make it look like she's the victim and not me."Pt also reports "I just want my mom to like me and not what she wants me to be."2) Work- pt reports she changed jobs to have less stress and more income. Pt reports the job is more stressful than last and doesn't pay what she was told it would. 3) Continued SI: Pt denies intent or plan "but I know eventually, that's how I will die."4) Medical issues: "I have a lot of medical issues going on."5) Stepfather recently passed away. Pt had a contentious relationship with stepfather but feels more of  mother is less distracted. Pt continues to display a victimized mentality. Safety planned verbally. Pt agrees to call [facility] Hotline [phone]) if develops plan/intent.  Pt will be discharged from [facility] as scheduled today; although she called and isn't able to attend/log on today.  Pt attended all scheduled days.  Reports overall feeling "pretty good" today.  "I am much better than before; but I have my times when I am up and down."  Pt reports that her mania episodes have decreased since the addition of 50 mg of Seroquel.  States she's no longer sleep walking.  C/O having difficulty being around people.   According to pt, she did f/u with HR yesterday and was told that her insurance had terminated.  Pt is unclear when it had termed though.  Denies SI/HI or A/V hallucinations. A:  Discharge pt today.  F/U with Dr. Adele Schilder on 02-16-19 @ 11 a.m and will cancel appt with Micheline Chapman, LCSW (02-17-19) since insurance has termed.  Strongly recommended pt to f/u with [facility] of the [place] or [place] for counseling/psychiatry.  Pt states she will keep appt with Dr. Adele Schilder b/c she wants to discuss long term disability.  Reiterated to pt, she will be self pay.  Also, strongly recommended pt to complete an application for assistance; which can be found online or pick up at office.  R:  Pt receptive.   Dellia Nims, M.Ed,CNA

## 2019-02-16 ENCOUNTER — Other Ambulatory Visit: Payer: Self-pay

## 2019-02-16 ENCOUNTER — Ambulatory Visit (INDEPENDENT_AMBULATORY_CARE_PROVIDER_SITE_OTHER): Payer: BC Managed Care – PPO | Admitting: Psychiatry

## 2019-02-16 ENCOUNTER — Encounter (HOSPITAL_COMMUNITY): Payer: Self-pay | Admitting: Psychiatry

## 2019-02-16 DIAGNOSIS — F4310 Post-traumatic stress disorder, unspecified: Secondary | ICD-10-CM

## 2019-02-16 DIAGNOSIS — F319 Bipolar disorder, unspecified: Secondary | ICD-10-CM

## 2019-02-16 MED ORDER — ESCITALOPRAM OXALATE 5 MG PO TABS: 5.0000 mg | ORAL_TABLET | Freq: Every day | ORAL | 0 refills | Status: DC

## 2019-02-16 MED ORDER — QUETIAPINE FUMARATE 25 MG PO TABS: 25.0000 mg | ORAL_TABLET | Freq: Two times a day (BID) | ORAL | 1 refills | Status: DC

## 2019-02-16 MED ORDER — QUETIAPINE FUMARATE 200 MG PO TABS: 200.0000 mg | ORAL_TABLET | Freq: Every day | ORAL | 1 refills | Status: DC

## 2019-02-16 MED ORDER — LAMOTRIGINE 200 MG PO TABS: 200.0000 mg | ORAL_TABLET | Freq: Every day | ORAL | 0 refills | Status: DC

## 2019-02-16 NOTE — Progress Notes (Signed)
Virtual Visit via Telephone Note  I connected with Stefanie Galvan on 02/16/19 at 11:00 AM EDT by telephone and verified that I am speaking with the correct person using two identifiers.   I discussed the limitations, risks, security and privacy concerns of performing an evaluation and management service by telephone and the availability of in person appointments. I also discussed with the patient that there may be a patient responsible charge related to this service. The patient expressed understanding and agreed to proceed.   History of Present Illness: Patient was evaluated by phone session.  Patient was admitted on June 29 at behavioral health center due to having suicidal thoughts.  She was discharged on Abilify but continued on Lamictal and low-dose Lexapro.  1 week later patient started to have again suicidal thoughts and her friend took her to [facility] where she stayed few days again.  She was discharged on Seroquel and referred to IOP.  Patient finished IOP and she is no longer taking Abilify but continued on Seroquel 200 mg at bedtime and Seroquel 25 mg twice a day.  She continued on Lexapro 5 mg and Lamictal 200 mg daily.  Patient is feeling much better.  She is helping her father who lives in [place] and having some health issues.  Patient arrived [place] yesterday and her plan is to stay few weeks until her father gets better.  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  She was not happy because she was fired missing days even though she was hospitalized in the hospital at that time.  Patient excited because she has to upcoming interview for part-time work in [place].  She feels the current medicine is working very well.  She denies any irritability, anger, mania, psychosis.  She denies any crying spells or any feeling of hopelessness.  She is very optimistic and sleeping much better.  She  has no rash or any itching.  She is sleeping better and denies any nightmares or flashbacks.  She feels the Lamictal, Seroquel and Lexapro combination working very well.  She does not want to change medication.  She is hoping to come back in [place] in few weeks when her father get more stable.  I reviewed her blood work results when she was in in the hospital.  Her U tox was negative.  Her cholesterol is 272 and her LDL is 199.  Her hemoglobin A1c is normal.  Her CBC is within normal limits.     Past Psychiatric History:Reviewed H/Obipolar disorderand PTSD. H/Omultiple hospitalization.Last hospitalization July 2020 at [facility] and than IOP. H/O overdose on lithium, trazodone, Lexapro and Minipress. H/Opoor impulse controlwith excessive talking, buying, shopping, rage and mood lability. [facility] and behavioral center. TookSeroquel, Zoloft, Cymbalta, Paxil, Prozac, Ativan, Abilify, Wellbutrin, Minipress and lithium.   Recent Results (from the past 2160 hour(s))  Comprehensive metabolic panel     Status: Abnormal   Collection Time: 12/15/18  6:38 PM  Result Value Ref Range   Sodium 139 135 - 145 mmol/L   Potassium 4.0 3.5 - 5.1 mmol/L   Chloride 106 98 - 111 mmol/L   CO2 25 22 - 32 mmol/L   Glucose, Bld 105 (H) 70 - 99 mg/dL   BUN 12 6 - 20 mg/dL   Creatinine, Ser 0.81 0.44 - 1.00 mg/dL   Calcium 9.3 8.9 - 10.3  mg/dL   Total Protein 7.4 6.5 - 8.1 g/dL   Albumin 4.3 3.5 - 5.0 g/dL   AST 22 15 - 41 U/L   ALT 26 0 - 44 U/L   Alkaline Phosphatase 76 38 - 126 U/L   Total Bilirubin 0.5 0.3 - 1.2 mg/dL   GFR calc non Af Amer >60 >60 mL/min   GFR calc Af Amer >60 >60 mL/min   Anion gap 8 5 - 15    Comment: Performed at [facility], [address] [place], [place] [address]  Urine rapid drug screen (hosp performed)not at [facility]     Status: None   Collection Time: 12/15/18  8:07 PM  Result Value Ref Range    Opiates NONE DETECTED NONE DETECTED   Cocaine NONE DETECTED NONE DETECTED   Benzodiazepines NONE DETECTED NONE DETECTED   Amphetamines NONE DETECTED NONE DETECTED   Tetrahydrocannabinol NONE DETECTED NONE DETECTED   Barbiturates NONE DETECTED NONE DETECTED    Comment: (NOTE) DRUG SCREEN FOR MEDICAL PURPOSES ONLY.  IF CONFIRMATION IS NEEDED FOR ANY PURPOSE, NOTIFY LAB WITHIN 5 DAYS. LOWEST DETECTABLE LIMITS FOR URINE DRUG SCREEN Drug Class                     Cutoff (ng/mL) Amphetamine and metabolites    1000 Barbiturate and metabolites    200 Benzodiazepine                 200 Tricyclics and metabolites     300 Opiates and metabolites        300 Cocaine and metabolites        300 THC                            50 Performed at [facility], [address] [place], [place] [address]   Urinalysis, Complete w Microscopic     Status: Abnormal   Collection Time: 12/15/18  8:07 PM  Result Value Ref Range   Color, Urine YELLOW YELLOW   APPearance HAZY (A) CLEAR   Specific Gravity, Urine 1.014 1.005 - 1.030   pH 6.0 5.0 - 8.0   Glucose, UA NEGATIVE NEGATIVE mg/dL   Hgb urine dipstick NEGATIVE NEGATIVE   Bilirubin Urine NEGATIVE NEGATIVE   Ketones, ur NEGATIVE NEGATIVE mg/dL   Protein, ur NEGATIVE NEGATIVE mg/dL   Nitrite NEGATIVE NEGATIVE   Leukocytes,Ua TRACE (A) NEGATIVE   RBC / HPF 0-5 0 - 5 RBC/hpf   WBC, UA 0-5 0 - 5 WBC/hpf   Bacteria, UA RARE (A) NONE SEEN   Squamous Epithelial / LPF 6-10 0 - 5   Mucus PRESENT     Comment: Performed at [facility], [address] [place], [place] [address]  CBC with Differential/Platelet     Status: None   Collection Time: 12/16/18  6:32 AM  Result Value Ref Range   WBC 5.7 4.0 - 10.5 K/uL   RBC 4.65 3.87 - 5.11 MIL/uL   Hemoglobin 12.8 12.0 - 15.0 g/dL   HCT 91.439.1 78.236.0 - 95.646.0 %   MCV 84.1 80.0 - 100.0 fL   MCH 27.5 26.0 - 34.0 pg   MCHC 32.7 30.0 - 36.0 g/dL   RDW 21.313.0 08.611.5 - 57.815.5 %    Platelets 240 150 - 400 K/uL   nRBC 0.0 0.0 - 0.2 %   Neutrophils Relative % 53 %   Neutro Abs 3.0 1.7 -  7.7 K/uL   Lymphocytes Relative 35 %   Lymphs Abs 2.0 0.7 - 4.0 K/uL   Monocytes Relative 7 %   Monocytes Absolute 0.4 0.1 - 1.0 K/uL   Eosinophils Relative 4 %   Eosinophils Absolute 0.2 0.0 - 0.5 K/uL   Basophils Relative 1 %   Basophils Absolute 0.0 0.0 - 0.1 K/uL   Immature Granulocytes 0 %   Abs Immature Granulocytes 0.02 0.00 - 0.07 K/uL    Comment: Performed at [facility], [address] [place], [place] [address]  TSH     Status: None   Collection Time: 12/16/18  6:32 AM  Result Value Ref Range   TSH 1.918 0.350 - 4.500 uIU/mL    Comment: Performed by a 3rd Generation assay with a functional sensitivity of <=0.01 uIU/mL. Performed at [facility], [address] [place], [place] [address]   Lipid panel     Status: Abnormal   Collection Time: 12/16/18  6:32 AM  Result Value Ref Range   Cholesterol 272 (H) 0 - 200 mg/dL   Triglycerides 098156 (H) <150 mg/dL   HDL 42 >11>40 mg/dL   Total CHOL/HDL Ratio 6.5 RATIO   VLDL 31 0 - 40 mg/dL   LDL Cholesterol 914199 (H) 0 - 99 mg/dL    Comment:        Total Cholesterol/HDL:CHD Risk Coronary Heart Disease Risk Table                     Men   Women  1/2 Average Risk   3.4   3.3  Average Risk       5.0   4.4  2 X Average Risk   9.6   7.1  3 X Average Risk  23.4   11.0        Use the calculated Patient Ratio above and the CHD Risk Table to determine the patient's CHD Risk.        ATP III CLASSIFICATION (LDL):  <100     mg/dL   Optimal  [id number]  mg/dL   Near or Above                    Optimal  130-159  mg/dL   Borderline  [id number]  mg/dL   High  >578>190     mg/dL   Very High Performed at [facility], [address] [place], [place] [address]   Hemoglobin A1c     Status: None   Collection Time: 12/16/18  6:32 AM  Result Value Ref Range   Hgb A1c MFr Bld 5.4 4.8 -  5.6 %    Comment: (NOTE) Pre diabetes:          5.7%-6.4% Diabetes:              >6.4% Glycemic control for   <7.0% adults with diabetes    Mean Plasma Glucose 108.28 mg/dL    Comment: Performed at [facility], [address] [place], [place] [address]     Psychiatric Specialty Exam: Physical Exam  ROS  There were no vitals taken for this visit.There is no height or weight on file to calculate BMI.  General Appearance: NA  Eye Contact:  NA  Speech:  Clear and Coherent  Volume:  Normal  Mood:  Euthymic  Affect:  NA  Thought Process:  Goal Directed  Orientation:  Full (Time, Place, and Person)  Thought Content:  Logical  Suicidal Thoughts:  No  Homicidal Thoughts:  No  Memory:  Immediate;   Good Recent;   Good Remote;   Good  Judgement:  Fair  Insight:  Fair  Psychomotor Activity:  NA  Concentration:  Concentration: Fair and Attention Span: Fair  Recall:  Good  Fund of Knowledge:  Good  Language:  Good  Akathisia:  No  Handed:  Right  AIMS (if indicated):     Assets:  Communication Skills Desire for Improvement Housing Resilience Social Support  ADL's:  Intact  Cognition:  WNL  Sleep:   fair       Assessment and Plan: Bipolar disorder, depressed type.  Posttraumatic stress disorder.  I reviewed records from inpatient and IOP.  She is now taking Seroquel 200 mg at bedtime and 25 mg twice a day along with Lamictal 200 mg daily and Lexapro 5 mg daily.  She is no longer taking hydroxyzine and Abilify.  We discussed medication side effect specially increased blood sugar, metabolic syndrome, hyperlipidemia.  Patient is aware about the side effects and so far she is tolerating very well.  She promised that she will check her blood sugar every day since her father has diabetes and he has glucometer.  She like to resume therapy when she comes back to [place].  She has no tremors, shakes or any EPS.  I will continue current medication.  Discussed safety concerns  and anytime having active suicidal thoughts or homicidal thought that she need to call 911 of the local emergency room.  Follow-up in 2 months.  Time spent 30 minutes.  Follow Up Instructions:    I discussed the assessment and treatment plan with the patient. The patient was provided an opportunity to ask questions and all were answered. The patient agreed with the plan and demonstrated an understanding of the instructions.   The patient was advised to call back or seek an in-person evaluation if the symptoms worsen or if the condition fails to improve as anticipated.  I provided 30 minutes of non-face-to-face time during this encounter.   Cleotis Nipper, MD

## 2019-02-17 ENCOUNTER — Ambulatory Visit (HOSPITAL_COMMUNITY): Payer: Self-pay | Admitting: Licensed Clinical Social Worker

## 2019-04-15 ENCOUNTER — Other Ambulatory Visit: Payer: Self-pay

## 2019-04-15 ENCOUNTER — Encounter (HOSPITAL_COMMUNITY): Payer: Self-pay | Admitting: Psychiatry

## 2019-04-15 ENCOUNTER — Ambulatory Visit (INDEPENDENT_AMBULATORY_CARE_PROVIDER_SITE_OTHER): Payer: Self-pay | Admitting: Psychiatry

## 2019-04-15 DIAGNOSIS — F4310 Post-traumatic stress disorder, unspecified: Secondary | ICD-10-CM

## 2019-04-15 DIAGNOSIS — F319 Bipolar disorder, unspecified: Secondary | ICD-10-CM

## 2019-04-15 MED ORDER — LAMOTRIGINE 200 MG PO TABS: 200.0000 mg | ORAL_TABLET | Freq: Every day | ORAL | 2 refills | Status: DC

## 2019-04-15 MED ORDER — ESCITALOPRAM OXALATE 5 MG PO TABS: 5.0000 mg | ORAL_TABLET | Freq: Every day | ORAL | 2 refills | Status: DC

## 2019-04-15 MED ORDER — QUETIAPINE FUMARATE 25 MG PO TABS: 25.0000 mg | ORAL_TABLET | Freq: Two times a day (BID) | ORAL | 2 refills | Status: DC

## 2019-04-15 MED ORDER — QUETIAPINE FUMARATE 200 MG PO TABS: 200.0000 mg | ORAL_TABLET | Freq: Every day | ORAL | 2 refills | Status: DC

## 2019-04-15 NOTE — Progress Notes (Signed)
Virtual Visit via Telephone Note  I connected with Stefanie Galvan on 04/15/19 at  9:40 AM EDT by telephone and verified that I am speaking with the correct person using two identifiers.   I discussed the limitations, risks, security and privacy concerns of performing an evaluation and management service by telephone and the availability of in person appointments. I also discussed with the patient that there may be a patient responsible charge related to this service. The patient expressed understanding and agreed to proceed.   History of Present Illness: Patient was evaluated by phone session.  Patient admitted ran out from her medication 2 days ago and experiencing anxiety, irritability but overall she feels the medicine working very well.  She decided to come back to [place] his father is doing better healthwise.  She thought that she will stay with her father but things did not work out.  She reported sleeping better with the Seroquel.  She denies any mania, psychosis or any hallucination.  Since she has changed plan to move back to [place] she will start looking for a job.  She is not drinking or using any illegal substances.  She has no tremors, shakes or any EPS.  She has no rash or any itching.  She like to continue Seroquel, Lamictal and Lexapro.  She reported her appetite is okay and weight is unchanged from the past.  She sleeps better with the Seroquel and denies any recent nightmares, flashback or any bad dreams.   Past Psychiatric History:Reviewed H/Obipolar disorderand PTSD. H/Omultiple hospitalization.Last hospitalization July 2020 at [facility] and than IOP. H/O overdose on lithium, trazodone, Lexapro and Minipress. H/Opoor impulse controlwith excessive talking, buying, shopping, rage and mood lability. [facility]. TookSeroquel, Zoloft, Cymbalta, Paxil, Prozac, Ativan, Abilify,  Wellbutrin, Minipress and lithium.     Psychiatric Specialty Exam: Physical Exam  ROS  There were no vitals taken for this visit.There is no height or weight on file to calculate BMI.  General Appearance: NA  Eye Contact:  NA  Speech:  Clear and Coherent and Normal Rate  Volume:  Normal  Mood:  Euthymic  Affect:  NA  Thought Process:  Goal Directed  Orientation:  Full (Time, Place, and Person)  Thought Content:  Logical  Suicidal Thoughts:  No  Homicidal Thoughts:  No  Memory:  Immediate;   Good Recent;   Good Remote;   Good  Judgement:  Good  Insight:  Present  Psychomotor Activity:  NA  Concentration:  Concentration: Fair and Attention Span: Fair  Recall:  Good  Fund of Knowledge:  Good  Language:  Good  Akathisia:  No  Handed:  Right  AIMS (if indicated):     Assets:  Communication Skills Desire for Improvement Housing Resilience  ADL's:  Intact  Cognition:  WNL  Sleep:   ok      Assessment and Plan: Bipolar disorder, depressed type.  Posttraumatic stress disorder.  Patient doing better on her medication.  She decided to move back to [place] as things did not work out with her father in [place].  She like to continue medicine which is working well.  I will continue Seroquel 200 mg at bedtime, Seroquel 25 mg twice a day and Lamictal 200 mg daily and Lexapro 5 mg daily.  Discussed medication side effects and benefits.  Encourage healthy lifestyle and watch her calorie intake.  We offered to resume therapy and she is looking  into it once she settled in [place].  I recommended to call us back if is any question or any concern.  Follow-up in 3 months.  Follow Up Instructions:    I discussed the assessment and treatment plan with the patient. The patient was provided an opportunity to ask questions and all were answered. The patient agreed with the plan and demonstrated an understanding of the instructions.   The patient was advised to call back or seek  an in-person evaluation if the symptoms worsen or if the condition fails to improve as anticipated.  I provided 20 minutes of non-face-to-face time during this encounter.   Kathlee Nations, MD

## 2019-05-06 ENCOUNTER — Other Ambulatory Visit: Payer: Self-pay

## 2019-05-06 ENCOUNTER — Ambulatory Visit (INDEPENDENT_AMBULATORY_CARE_PROVIDER_SITE_OTHER): Payer: Self-pay | Admitting: Licensed Clinical Social Worker

## 2019-05-06 DIAGNOSIS — F319 Bipolar disorder, unspecified: Secondary | ICD-10-CM

## 2019-05-07 NOTE — Progress Notes (Signed)
Virtual Visit via Telephone Note  I connected with [name] on 05/07/19 at  4:00 PM EST by telephone and verified that I am speaking with the correct person using two identifiers.   I discussed the limitations, risks, security and privacy concerns of performing an evaluation and management service by telephone and the availability of in person appointments. I also discussed with the patient that there may be a patient responsible charge related to this service. The patient expressed understanding and agreed to proceed.   I discussed the assessment and treatment plan with the patient. The patient was provided an opportunity to ask questions and all were answered. The patient agreed with the plan and demonstrated an understanding of the instructions.   The patient was advised to call back or seek an in-person evaluation if the symptoms worsen or if the condition fails to improve as anticipated.  I provided 45 minutes of non-face-to-face time during this encounter.   Olegario Messier, LCSW    THERAPIST PROGRESS NOTE  Session Time: 4:05pm-4:55pm  Participation Level: Active  Behavioral Response: NAAlertEuthymic  Type of Therapy: Individual Therapy  Treatment Goals addressed: Coping and Diagnosis: Client will maintain stable mood at least 5/7 days per week by use of healthy coping skills at least one time daily at least 5 days per week.  Interventions: CBT and Supportive  Summary: OONA TRAMMEL is a 53 y.o. female who presents with Bipolar 1 disorder. Client shared her father passed away over the weekend. Client processed the interactions she had with her father and step mother resulting in moving back to [facility]. Client reports moving back in with her mother and not having much fighting at the moment. Client has gained employment and reports feeling successful. Client states she is working on developing and maintaining healthy relationships, not putting energy into  relationships she does not believe benefit her. Client reports feeling proud of her response of politely turning someone down and holding the boundary firm.   Suicidal/Homicidal: Nowithout intent/plan  Therapist Response: Clinician met with client via telehealth. Clinician assessed for SI/HI/psychosis and overall level of functioning. Clinician and client discussed the thought process of client moving back to [place] and client processed how the relationship with her father and step mother affected this process. Client shows progress toward goals AEB maintaining a stable mood per self report, taking medications as prescribed for at least the past week, and was able to demonstrate in session maintaining healthy boundaries with males. Client was able to identify aspects of healthy communication and interaction as well as being comfortable with saying 'no' multiple times despite feelings of the other person.   Plan: Return again in 2-3 weeks.  Diagnosis: Axis I: Bipolar 1 Disorder        Olegario Messier, LCSW 05/06/19

## 2019-07-16 ENCOUNTER — Encounter (HOSPITAL_COMMUNITY): Payer: Self-pay | Admitting: Psychiatry

## 2019-07-16 ENCOUNTER — Ambulatory Visit (INDEPENDENT_AMBULATORY_CARE_PROVIDER_SITE_OTHER): Payer: Self-pay | Admitting: Psychiatry

## 2019-07-16 ENCOUNTER — Other Ambulatory Visit: Payer: Self-pay

## 2019-07-16 DIAGNOSIS — F419 Anxiety disorder, unspecified: Secondary | ICD-10-CM

## 2019-07-16 DIAGNOSIS — F319 Bipolar disorder, unspecified: Secondary | ICD-10-CM

## 2019-07-16 DIAGNOSIS — F4310 Post-traumatic stress disorder, unspecified: Secondary | ICD-10-CM

## 2019-07-16 MED ORDER — QUETIAPINE FUMARATE 25 MG PO TABS: 25.0000 mg | ORAL_TABLET | Freq: Two times a day (BID) | ORAL | 2 refills | Status: DC

## 2019-07-16 MED ORDER — ESCITALOPRAM OXALATE 5 MG PO TABS: 5.0000 mg | ORAL_TABLET | Freq: Every day | ORAL | 2 refills | Status: DC

## 2019-07-16 MED ORDER — HYDROXYZINE PAMOATE 25 MG PO CAPS: 25.0000 mg | ORAL_CAPSULE | Freq: Every day | ORAL | 0 refills | Status: DC | PRN

## 2019-07-16 MED ORDER — LAMOTRIGINE 200 MG PO TABS: 200.0000 mg | ORAL_TABLET | Freq: Every day | ORAL | 2 refills | Status: DC

## 2019-07-16 MED ORDER — QUETIAPINE FUMARATE 200 MG PO TABS: 200.0000 mg | ORAL_TABLET | Freq: Every day | ORAL | 2 refills | Status: DC

## 2019-07-16 NOTE — Progress Notes (Signed)
Virtual Visit via Telephone Note  I connected with Stefanie Galvan on 07/16/19 at  8:20 AM EST by telephone and verified that I am speaking with the correct person using two identifiers.   I discussed the limitations, risks, security and privacy concerns of performing an evaluation and management service by telephone and the availability of in person appointments. I also discussed with the patient that there may be a patient responsible charge related to this service. The patient expressed understanding and agreed to proceed.   History of Present Illness: Patient was evaluated through phone session.  She is doing actually much better.  Her father passed away in [place] and she is back to [place].  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  She also started weight loss program and she is consistent with the program and happy that she lost weight.  She is sleeping good.  She admitted some time takes hydroxyzine 25 mg capsule which was left over since last year.  She was prescribed 60 capsule in March 2020 which she is using as needed.  She need a new refill.  She denies any mania, psychosis, hallucination or any mood swings.  She is living with her mother but hoping to have her own place as she is working.  She denies drinking or using any illegal substances.  Energy level is good.  She has no rash, itching tremors or shakes.  Her nightmares and flashbacks are not as bad as she is able to sleep much better.  Since started exercise and weight loss she feels very good.  She like to continue current medication and also need a new prescription of hydroxyzine to take as needed for severe anxiety.  Once she resume her insurance she will restart therapy with [facility].   Past Psychiatric History:Reviewed H/Obipolar disorderand PTSD. H/Omultiple  hospitalization.Last inpatient in July 2020 at [facility] and than IOP.  H/Ooverdose on lithium,  trazodone,LexaproandMinipress. H/Opoor impulse controlwith excessive talking, buying, shopping, rage and mood lability. [facility]. TookSeroquel, Zoloft, Cymbalta, Paxil, Prozac, Ativan, Abilify, Wellbutrin, Minipress and lithium.     Psychiatric Specialty Exam: Physical Exam  Review of Systems  There were no vitals taken for this visit.There is no height or weight on file to calculate BMI.  General Appearance: NA  Eye Contact:  NA  Speech:  Clear and Coherent and Normal Rate  Volume:  Normal  Mood:  Euthymic  Affect:  NA  Thought Process:  Goal Directed  Orientation:  Full (Time, Place, and Person)  Thought Content:  WDL  Suicidal Thoughts:  No  Homicidal Thoughts:  No  Memory:  Immediate;   Good Recent;   Good Remote;   Good  Judgement:  Intact  Insight:  Present  Psychomotor Activity:  NA  Concentration:  Concentration: Fair and Attention Span: Fair  Recall:  Good  Fund of Knowledge:  Good  Language:  Good  Akathisia:  No  Handed:  Right  AIMS (if indicated):     Assets:  Communication Skills Desire for Improvement Housing Resilience Social Support Talents/Skills  ADL's:  Intact  Cognition:  WNL  Sleep:   good      Assessment and Plan: Bipolar disorder, depressed type.  PTSD.  Anxiety.  Patient is a stable on her current medication.  She is now have a job and hoping to move out to have her own  place.  Encouraged to continue weight loss program since she is feeling better with the program.  Continue Seroquel 200 mg at bedtime, Seroquel 25 mg twice a day, Lamictal 200 mg daily, Lexapro 5 mg daily and we will refill hydroxyzine 25 mg to take as needed for severe anxiety.  Discussed medication side effects and benefits.  Recommended to call us back if she has any question or any concern.  Follow-up in 3 months.  Follow Up Instructions:    I discussed the assessment and  treatment plan with the patient. The patient was provided an opportunity to ask questions and all were answered. The patient agreed with the plan and demonstrated an understanding of the instructions.   The patient was advised to call back or seek an in-person evaluation if the symptoms worsen or if the condition fails to improve as anticipated.  I provided 20 minutes of non-face-to-face time during this encounter.   Kathlee Nations, MD

## 2019-10-14 ENCOUNTER — Other Ambulatory Visit: Payer: Self-pay

## 2019-10-14 ENCOUNTER — Telehealth (INDEPENDENT_AMBULATORY_CARE_PROVIDER_SITE_OTHER): Payer: 59 | Admitting: Psychiatry

## 2019-10-14 ENCOUNTER — Encounter (HOSPITAL_COMMUNITY): Payer: Self-pay | Admitting: Psychiatry

## 2019-10-14 VITALS — Wt 210.0 lb

## 2019-10-14 DIAGNOSIS — F4310 Post-traumatic stress disorder, unspecified: Secondary | ICD-10-CM | POA: Diagnosis not present

## 2019-10-14 DIAGNOSIS — F319 Bipolar disorder, unspecified: Secondary | ICD-10-CM | POA: Diagnosis not present

## 2019-10-14 DIAGNOSIS — F419 Anxiety disorder, unspecified: Secondary | ICD-10-CM | POA: Diagnosis not present

## 2019-10-14 MED ORDER — ESCITALOPRAM OXALATE 5 MG PO TABS: 5.0000 mg | ORAL_TABLET | Freq: Every day | ORAL | 2 refills | Status: DC

## 2019-10-14 MED ORDER — LAMOTRIGINE 200 MG PO TABS: 200.0000 mg | ORAL_TABLET | Freq: Every day | ORAL | 2 refills | Status: DC

## 2019-10-14 MED ORDER — QUETIAPINE FUMARATE 200 MG PO TABS: 200.0000 mg | ORAL_TABLET | Freq: Every day | ORAL | 2 refills | Status: DC

## 2019-10-14 MED ORDER — QUETIAPINE FUMARATE 25 MG PO TABS: 25.0000 mg | ORAL_TABLET | Freq: Two times a day (BID) | ORAL | 2 refills | Status: DC

## 2019-10-14 NOTE — Progress Notes (Signed)
Virtual Visit via Telephone Note  I connected with Stefanie Galvan on 10/14/19 at  8:20 AM EDT by telephone and verified that I am speaking with the correct person using two identifiers.   I discussed the limitations, risks, security and privacy concerns of performing an evaluation and management service by telephone and the availability of in person appointments. I also discussed with the patient that there may be a patient responsible charge related to this service. The patient expressed understanding and agreed to proceed.   History of Present Illness: Patient is evaluated by phone session.  She is doing much better on her medication.  She is very excited because she is going to move to her own place starting today and hoping by the end of the weekend she will finally have her own place.  Her job is going well.  She had a good support from the coworker.  She denies any irritability, anger, mania or any psychosis.  She is sleeping good.  She feels her mood is much stable and she is more productive.  She has stopped weight loss program as she has been very busy lately but she is able to keep her weight at 210.  She rarely takes hydroxyzine when she is very anxious.  She has no rash, itching tremors or shakes.  Her nightmares flashbacks are not as intense and once a while she gets nightmares.  She denies drinking or using any illegal substances.  Her energy level is good.   Past Psychiatric History:Reviewed H/Obipolar disorder, PTSD ans multiple inpatient. Last inpatient in July 2020 at [facility] and than IOP. H/O overdose on lithium, trazodone,LexaproandMinipress. H/Opoor impulse controlwith excessive talking and excessive spending. H/O inpatientat [facility], [facility] and [facility]. TookSeroquel, Zoloft, Cymbalta, Paxil, Prozac, Ativan, Abilify, Wellbutrin, Minipress and lithium.    Psychiatric Specialty Exam: Physical Exam  Review of Systems  There were no vitals taken for  this visit.There is no height or weight on file to calculate BMI.  General Appearance: NA  Eye Contact:  NA  Speech:  Clear and Coherent  Volume:  Normal  Mood:  Euthymic  Affect:  NA  Thought Process:  Goal Directed  Orientation:  Full (Time, Place, and Person)  Thought Content:  WDL  Suicidal Thoughts:  No  Homicidal Thoughts:  No  Memory:  Immediate;   Good Recent;   Good Remote;   Good  Judgement:  Intact  Insight:  Present  Psychomotor Activity:  NA  Concentration:  Concentration: Good and Attention Span: Good  Recall:  Good  Fund of Knowledge:  Good  Language:  Good  Akathisia:  No  Handed:  Right  AIMS (if indicated):     Assets:  Communication Skills Desire for Improvement Housing Resilience Social Support Talents/Skills Transportation  ADL's:  Intact  Cognition:  WNL  Sleep:   sleep      Assessment and Plan: Bipolar disorder, depressed type.  PTSD.  Anxiety.  Patient doing well on her current medication.  Discussed polypharmacy and recommend to try lowering the dose but patient is afraid since she is doing well and is stable on current medication.  She does not want to change or reduce the dose.  I will continue Seroquel 25 mg twice a day, Seroquel 200 mg at bedtime, Lamictal 200 mg daily, Lexapro 5 mg daily.  She does not need a new prescription of hydroxyzine which she usually takes when she is very anxious.  Recommended to call us back if  she is any question or any concern.  Discussed healthy lifestyle and she has been doing it and feels good.  Follow-up in 3 months.  Follow Up Instructions:    I discussed the assessment and treatment plan with the patient. The patient was provided an opportunity to ask questions and all were answered. The patient agreed with the plan and demonstrated an understanding of the instructions.   The patient was advised to call back or seek an in-person evaluation if the symptoms worsen or if the condition fails to improve as  anticipated.  I provided 20 minutes of non-face-to-face time during this encounter.   Cleotis Nipper, MD

## 2019-12-18 ENCOUNTER — Telehealth (HOSPITAL_COMMUNITY): Payer: Self-pay | Admitting: *Deleted

## 2019-12-18 NOTE — Telephone Encounter (Signed)
Writer returned pt call with c/o increased crying spells and depression. Pt says that she can feel herself spiraling down into a depression. Pt denies s.i. but was encouraged by this nurse to seek help at [facility], [facility], or closest ED if she starts to have s.i. pt verbalized understanding. Contracts verbally for safety and stated that she could have one her kids come stay with her the weekend. Pt would like to speak to you directly if possible. Next appointment is on 01/15/20.

## 2019-12-22 ENCOUNTER — Telehealth (HOSPITAL_COMMUNITY): Payer: Self-pay | Admitting: *Deleted

## 2019-12-22 NOTE — Telephone Encounter (Signed)
I called her back and left a message.

## 2019-12-22 NOTE — Telephone Encounter (Signed)
Pt called again asking to speak with MD regarding increasing depression and anxiety. Pt has an upcoming appointment on 01/15/20. Please review.

## 2019-12-22 NOTE — Telephone Encounter (Signed)
Called and left message.

## 2020-01-15 ENCOUNTER — Telehealth (INDEPENDENT_AMBULATORY_CARE_PROVIDER_SITE_OTHER): Payer: 59 | Admitting: Psychiatry

## 2020-01-15 ENCOUNTER — Other Ambulatory Visit: Payer: Self-pay

## 2020-01-15 VITALS — Wt 229.0 lb

## 2020-01-15 DIAGNOSIS — F4310 Post-traumatic stress disorder, unspecified: Secondary | ICD-10-CM

## 2020-01-15 DIAGNOSIS — F419 Anxiety disorder, unspecified: Secondary | ICD-10-CM

## 2020-01-15 DIAGNOSIS — F319 Bipolar disorder, unspecified: Secondary | ICD-10-CM

## 2020-01-15 MED ORDER — QUETIAPINE FUMARATE 200 MG PO TABS: 200.0000 mg | ORAL_TABLET | Freq: Every day | ORAL | 2 refills | Status: DC

## 2020-01-15 MED ORDER — QUETIAPINE FUMARATE 25 MG PO TABS: ORAL_TABLET | ORAL | 1 refills | Status: DC

## 2020-01-15 MED ORDER — LAMOTRIGINE 200 MG PO TABS: 200.0000 mg | ORAL_TABLET | Freq: Every day | ORAL | 2 refills | Status: DC

## 2020-01-15 MED ORDER — ESCITALOPRAM OXALATE 5 MG PO TABS: 5.0000 mg | ORAL_TABLET | Freq: Every day | ORAL | 2 refills | Status: DC

## 2020-01-15 NOTE — Progress Notes (Signed)
Virtual Visit via Telephone Note  I connected with Stefanie Galvan on 01/15/20 at  8:20 AM EDT by telephone and verified that I am speaking with the correct person using two identifiers.  Location: Patient: home Provider: home office   I discussed the limitations, risks, security and privacy concerns of performing an evaluation and management service by telephone and the availability of in person appointments. I also discussed with the patient that there may be a patient responsible charge related to this service. The patient expressed understanding and agreed to proceed.   History of Present Illness: Patient is evaluated by phone session.  She had called earlier this month because she was very anxious but when we called back she was not available.  Patient told she was struggling at work because one of the [employer] cabin crew making fun of her bipolar disorder and she was not happy.  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  She is getting trained at home and she excited because her job is from home.  She enjoys her own place but she have to keep her mother for 2 weeks until her mother find a new home.  Patient told her mother sold the house and now mother and patient's brother are staying borderline.  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  She denies any mania, psychosis, hallucination.  She feels her mood is stable and she is happy because she is keeping her place clean.  Her anxiety is not as bad.  Sometimes she takes hydroxyzine but she had cut down her Seroquel 25 mg during the day because it was making her very sleepy tired.  Some nights she has nightmares but they are not as bad.  Her energy level is good.  She denies drinking or using any illegal substances.     Past Psychiatric History:Reviewed H/Obipolar disorder, PTSD ans multiple inpatient. [facility]in[facility]July [facility] at [facility] and than IOP. H/O overdose on lithium, trazodone,LexaproandMinipress. H/Opoor impulse controlwith excessive talking and excessive spending. H/O inpatientat old [facility], [facility], charter and [facility]. TookSeroquel, Zoloft, Cymbalta, Paxil, Prozac, Ativan, Abilify, Wellbutrin, Minipress and lithium.    Psychiatric Specialty Exam: Physical Exam  Review of Systems  Weight (!) 229 lb (103.9 kg).There is no height or weight on file to calculate BMI.  General Appearance: NA  Eye Contact:  NA  Speech:  Normal Rate  Volume:  Normal  Mood:  Dysphoric and emotional  Affect:  NA  Thought Process:  Goal Directed  Orientation:  Full (Time, Place, and Person)  Thought Content:  WDL  Suicidal Thoughts:  No  Homicidal Thoughts:  No  Memory:  Immediate;   Good Recent;   Good Remote;   Good  Judgement:  Intact  Insight:  Present  Psychomotor Activity:  NA  Concentration:  Concentration: Good and Attention Span: Good  Recall:  Good  Fund of Knowledge:  Good  Language:  Good  Akathisia:  No  Handed:  Right  AIMS (if indicated):     Assets:  Communication Skills Desire for Improvement Housing Resilience Social Support Talents/Skills Transportation  ADL's:  Intact  Cognition:  WNL  Sleep:   ok      Assessment and Plan: Bipolar disorder type versus depressed  type.  PTSD.  Patient quit her job and started a new job and she is happy about it.  She feels her stress level is much better and she is not as anxious.  She is not taking Seroquel 25 mg during the day but compliant with 200 mg at bedtime, Lamictal 200 mg daily, Lexapro 5 mg daily and hydroxyzine as needed.  We talked about somewhat more emotional today and excited.  Discussed relapsing signs of mania.  Recommended rather than taking hydroxyzine she should try 25 mg Seroquel to take 1 to 2 tablet to  help her anxiety and sleep.  She agreed with the plan.  We also talked about getting blood work since last blood work back in July 2020 had blood sugar 140.  Patient seen Dr. Alain Honey in [place] and she will call to schedule appointment for blood work including hemoglobin A1c.  We will continue Lamictal 200 mg daily, Lexapro 5 mg daily, Seroquel 200 mg at bedtime and she will take Seroquel 25 mg 1 to 2 tablet as needed.  She has no rash, itching, tremors or shakes.  Follow-up in 3 months.  Follow Up Instructions:    I discussed the assessment and treatment plan with the patient. The patient was provided an opportunity to ask questions and all were answered. The patient agreed with the plan and demonstrated an understanding of the instructions.   The patient was advised to call back or seek an in-person evaluation if the symptoms worsen or if the condition fails to improve as anticipated.  I provided 20 minutes of non-face-to-face time during this encounter.   Cleotis Nipper, MD

## 2020-02-25 ENCOUNTER — Inpatient Hospital Stay (EMERGENCY_DEPARTMENT_HOSPITAL)
Admission: EM | Admit: 2020-02-25 | Discharge: 2020-02-26 | Disposition: A | Discharge status: Home / Self Care | Payer: 59 | Source: Home / Self Care | Attending: Emergency Medicine | Admitting: Emergency Medicine

## 2020-02-25 ENCOUNTER — Emergency Department (HOSPITAL_COMMUNITY): Payer: 59

## 2020-02-25 ENCOUNTER — Encounter (HOSPITAL_COMMUNITY): Payer: Self-pay

## 2020-02-25 ENCOUNTER — Other Ambulatory Visit: Payer: Self-pay

## 2020-02-25 DIAGNOSIS — Z79899 Other long term (current) drug therapy: Secondary | ICD-10-CM | POA: Diagnosis not present

## 2020-02-25 DIAGNOSIS — Z9851 Tubal ligation status: Secondary | ICD-10-CM | POA: Insufficient documentation

## 2020-02-25 DIAGNOSIS — F4312 Post-traumatic stress disorder, chronic: Secondary | ICD-10-CM | POA: Insufficient documentation

## 2020-02-25 DIAGNOSIS — M1990 Unspecified osteoarthritis, unspecified site: Secondary | ICD-10-CM | POA: Insufficient documentation

## 2020-02-25 DIAGNOSIS — Z818 Family history of other mental and behavioral disorders: Secondary | ICD-10-CM | POA: Insufficient documentation

## 2020-02-25 DIAGNOSIS — R1031 Right lower quadrant pain: Secondary | ICD-10-CM

## 2020-02-25 DIAGNOSIS — K449 Diaphragmatic hernia without obstruction or gangrene: Secondary | ICD-10-CM | POA: Insufficient documentation

## 2020-02-25 DIAGNOSIS — Z9049 Acquired absence of other specified parts of digestive tract: Secondary | ICD-10-CM | POA: Insufficient documentation

## 2020-02-25 DIAGNOSIS — F411 Generalized anxiety disorder: Secondary | ICD-10-CM | POA: Insufficient documentation

## 2020-02-25 DIAGNOSIS — F329 Major depressive disorder, single episode, unspecified: Secondary | ICD-10-CM | POA: Diagnosis not present

## 2020-02-25 DIAGNOSIS — Z886 Allergy status to analgesic agent status: Secondary | ICD-10-CM | POA: Insufficient documentation

## 2020-02-25 DIAGNOSIS — F319 Bipolar disorder, unspecified: Secondary | ICD-10-CM | POA: Insufficient documentation

## 2020-02-25 DIAGNOSIS — Z87820 Personal history of traumatic brain injury: Secondary | ICD-10-CM | POA: Insufficient documentation

## 2020-02-25 DIAGNOSIS — F419 Anxiety disorder, unspecified: Secondary | ICD-10-CM | POA: Diagnosis not present

## 2020-02-25 DIAGNOSIS — Z20822 Contact with and (suspected) exposure to covid-19: Secondary | ICD-10-CM | POA: Diagnosis not present

## 2020-02-25 LAB — URINALYSIS, ROUTINE W REFLEX MICROSCOPIC
Bilirubin Urine: NEGATIVE
Glucose, UA: NEGATIVE mg/dL
Hgb urine dipstick: NEGATIVE
Ketones, ur: NEGATIVE mg/dL
Leukocytes,Ua: NEGATIVE
Nitrite: NEGATIVE
Protein, ur: NEGATIVE mg/dL
Specific Gravity, Urine: 1.003 — ABNORMAL LOW (ref 1.005–1.030)
pH: 6 (ref 5.0–8.0)

## 2020-02-25 LAB — COMPREHENSIVE METABOLIC PANEL
ALT: 30 U/L (ref 0–44)
AST: 25 U/L (ref 15–41)
Albumin: 4.4 g/dL (ref 3.5–5.0)
Alkaline Phosphatase: 78 U/L (ref 38–126)
Anion gap: 9 (ref 5–15)
BUN: 9 mg/dL (ref 6–20)
CO2: 22 mmol/L (ref 22–32)
Calcium: 8.8 mg/dL — ABNORMAL LOW (ref 8.9–10.3)
Chloride: 107 mmol/L (ref 98–111)
Creatinine, Ser: 0.62 mg/dL (ref 0.44–1.00)
GFR calc Af Amer: >60 mL/min (ref >60)
GFR calc non Af Amer: >60 mL/min (ref >60)
Glucose, Bld: 95 mg/dL (ref 70–99)
Potassium: 3.7 mmol/L (ref 3.5–5.1)
Sodium: 138 mmol/L (ref 135–145)
Total Bilirubin: 0.3 mg/dL (ref 0.3–1.2)
Total Protein: 7.3 g/dL (ref 6.5–8.1)

## 2020-02-25 LAB — CBC
HCT: 37.3 % (ref 36.0–46.0)
Hemoglobin: 12.6 g/dL (ref 12.0–15.0)
MCH: 27.9 pg (ref 26.0–34.0)
MCHC: 33.8 g/dL (ref 30.0–36.0)
MCV: 82.5 fL (ref 80.0–100.0)
Platelets: 276 10*3/uL (ref 150–400)
RBC: 4.52 MIL/uL (ref 3.87–5.11)
RDW: 13.6 % (ref 11.5–15.5)
WBC: 6 10*3/uL (ref 4.0–10.5)
nRBC: 0 % (ref 0.0–0.2)

## 2020-02-25 LAB — I-STAT BETA HCG BLOOD, ED (MC, WL, AP ONLY): I-stat hCG, quantitative: <5 m[IU]/mL (ref <5)

## 2020-02-25 LAB — LIPASE, BLOOD: Lipase: 45 U/L (ref 11–51)

## 2020-02-25 MED ORDER — IOHEXOL 300 MG/ML  SOLN
100.0000 mL | Freq: Once | INTRAMUSCULAR | Status: AC | PRN
  Administered 2020-02-25: 100 mL via INTRAVENOUS

## 2020-02-25 MED ORDER — SODIUM CHLORIDE 0.9 % IV BOLUS
1000.0000 mL | Freq: Once | INTRAVENOUS | Status: AC
  Administered 2020-02-25: 1000 mL via INTRAVENOUS

## 2020-02-25 MED ORDER — ONDANSETRON HCL 4 MG/2ML IJ SOLN
4.0000 mg | Freq: Once | INTRAMUSCULAR | Status: AC
  Administered 2020-02-25: 4 mg via INTRAVENOUS
  Filled 2020-02-25: qty 2

## 2020-02-25 MED ORDER — HYDROMORPHONE HCL 1 MG/ML IJ SOLN
0.5000 mg | Freq: Once | INTRAMUSCULAR | Status: AC
  Administered 2020-02-25: 0.5 mg via INTRAVENOUS
  Filled 2020-02-25: qty 1

## 2020-02-25 MED ORDER — MORPHINE SULFATE (PF) 4 MG/ML IV SOLN
4.0000 mg | Freq: Once | INTRAVENOUS | Status: AC
  Administered 2020-02-25: 4 mg via INTRAVENOUS
  Filled 2020-02-25: qty 1

## 2020-02-25 NOTE — ED Triage Notes (Signed)
Pt arrives today c/o RLQ pain. Pt states that she has had this since Tuesday. Pt reports that she has had no appetite and pain is constant with pressures. Very small "confetti like" BMs. Pt in obvious discomfort.

## 2020-02-25 NOTE — ED Notes (Signed)
Report given to [facility] for transport to [facility]U

## 2020-02-25 NOTE — ED Provider Notes (Signed)
[facility]-EMERGENCY DEPT Provider Note   CSN: [id number] Arrival date & time: 02/25/20  1320     History Chief Complaint  Patient presents with  . Abdominal Pain    Stefanie Galvan is a 54 y.o. female history includes TBI, anxiety/depression, bipolar, cholecystectomy, tubal ligation.  Patient presents for right lower quadrant pain onset 2 days ago, gradually worsening, currently severe constant sharp pain nonradiating worsened with palpation no alleviating factors.  Reports associated nausea and constipation.  Denies fever/chills, headache, chest pain/shortness of breath, cough, dysuria/hematuria, vaginal bleeding/discharge, concern for STI, melena, hematochezia, vomiting or any additional concerns.  HPI     Past Medical History:  Diagnosis Date  . Anxiety   . Arthritis   . Depression 2.5 yrs ago   situational dep-no current tx/meds  . TBI (traumatic brain injury) (HCC) 11/10/2005    Patient Active Problem List   Diagnosis Date Noted  . Bipolar 1 disorder, depressed, severe (HCC) 12/22/2018  . Generalized anxiety disorder   . MDD (major depressive disorder), recurrent episode, severe (HCC) 12/15/2018  . Diverticulosis of sigmoid colon 11/30/2018  . Biliary colic 07/01/2018  . Bipolar affective (HCC) 09/24/2017  . Lithium overdose 09/23/2017  . Lithium overdose, intentional self-harm, initial encounter (HCC) 09/22/2017  . QT prolongation 09/22/2017  . Moderate bipolar I disorder, most recent episode depressed (HCC) 03/19/2017  . Bipolar 2 disorder, major depressive episode (HCC) 03/04/2017    Class: Chronic  . Chronic post-traumatic stress disorder (PTSD) 03/04/2017    Class: Chronic  . DDD (degenerative disc disease), lumbar 03/04/2017  . Arthritis 08/29/2016    Past Surgical History:  Procedure Laterality Date  . BREAST REDUCTION SURGERY  02/25/2012   Procedure: MAMMARY REDUCTION  (BREAST);  Surgeon: Louisa Second, MD;  Location:  [facility];  Service: Plastics;  Laterality: Bilateral;  bilateral reduction  . CHOLECYSTECTOMY N/A 07/01/2018   Procedure: LAPAROSCOPIC CHOLECYSTECTOMY;  Surgeon: Sung Amabile, DO;  Location: [facility] ORS;  Service: General;  Laterality: N/A;  . DILATION AND CURETTAGE OF UTERUS  2001  . TEMPOROMANDIBULAR JOINT SURGERY  1984; 1996   X 2; bilateral  . TUBAL LIGATION  2003     OB History   No obstetric history on file.     Family History  Problem Relation Age of Onset  . Depression Father   . Anxiety disorder Brother   . Depression Maternal Grandmother     Social History   Tobacco Use  . Smoking status: Never Smoker  . Smokeless tobacco: Never Used  Vaping Use  . Vaping Use: Never used  Substance Use Topics  . Alcohol use: Not Currently  . Drug use: No    Home Medications Prior to Admission medications   Medication Sig Start Date End Date Taking? Authorizing Provider  escitalopram (LEXAPRO) 5 MG tablet Take 1 tablet (5 mg total) by mouth daily. 01/15/20  Yes Arfeen, Phillips Grout, MD  ibuprofen (ADVIL) 200 MG tablet Take 600 mg by mouth every 6 (six) hours as needed for moderate pain.   Yes [provider]  lamoTRIgine (LAMICTAL) 200 MG tablet Take 1 tablet (200 mg total) by mouth daily. 01/15/20  Yes Arfeen, Phillips Grout, MD  QUEtiapine (SEROQUEL) 200 MG tablet Take 1 tablet (200 mg total) by mouth at bedtime. 01/15/20  Yes Arfeen, Phillips Grout, MD  QUEtiapine (SEROQUEL) 25 MG tablet Take one tab daily and 2nd if needed Patient taking differently: Take 25 mg by mouth daily. 2nd if needed 01/15/20  Yes Arfeen, Phillips Grout, MD  cyclobenzaprine (FLEXERIL) 5 MG tablet Take 1 tablet (5 mg total) by mouth 3 (three) times daily. Patient not taking: Reported on 10/14/2019 12/18/18   Aldean Baker, NP  hydrOXYzine (VISTARIL) 25 MG capsule Take 1 capsule (25 mg total) by mouth daily as needed for anxiety. Patient not taking: Reported on 01/15/2020 07/16/19   Arfeen, Phillips Grout, MD    Allergies      Anaprox [naproxen sodium]  Review of Systems   Review of Systems Ten systems are reviewed and are negative for acute change except as noted in the HPI  Physical Exam Updated Vital Signs BP 132/82 (BP Location: Left Arm)   Pulse 65   Temp 98 F (36.7 C) (Oral)   Resp 16   Ht 5\' 7"  (1.702 m)   Wt 81.6 kg   SpO2 97%   BMI 28.19 kg/m   Physical Exam Constitutional:      General: She is not in acute distress.    Appearance: Normal appearance. She is well-developed. She is not ill-appearing or diaphoretic.  HENT:     Head: Normocephalic and atraumatic.  Eyes:     General: Vision grossly intact. Gaze aligned appropriately.     Pupils: Pupils are equal, round, and reactive to light.  Neck:     Trachea: Trachea and phonation normal.  Pulmonary:     Effort: Pulmonary effort is normal. No respiratory distress.  Abdominal:     General: There is no distension.     Palpations: Abdomen is soft.     Tenderness: There is abdominal tenderness in the right lower quadrant. There is guarding. There is no rebound. Positive signs include McBurney's sign.  Genitourinary:    Comments: Deferred by patient Musculoskeletal:        General: Normal range of motion.     Cervical back: Normal range of motion.  Skin:    General: Skin is warm and dry.  Neurological:     Mental Status: She is alert.     GCS: GCS eye subscore is 4. GCS verbal subscore is 5. GCS motor subscore is 6.     Comments: Speech is clear and goal oriented, follows commands Major Cranial nerves without deficit, no facial droop Moves extremities without ataxia, coordination intact  Psychiatric:        Behavior: Behavior normal.     ED Results / Procedures / Treatments   Labs (all labs ordered are listed, but only abnormal results are displayed) Labs Reviewed  COMPREHENSIVE METABOLIC PANEL - Abnormal; Notable for the following components:      Result Value   Calcium 8.8 (*)    All other components within normal limits   URINALYSIS, ROUTINE W REFLEX MICROSCOPIC - Abnormal; Notable for the following components:   Color, Urine COLORLESS (*)    Specific Gravity, Urine 1.003 (*)    All other components within normal limits  LIPASE, BLOOD  CBC  I-STAT BETA HCG BLOOD, ED ([facility], [facility], [facility] ONLY)    EKG None  Radiology CT ABDOMEN PELVIS W CONTRAST  Result Date: 02/25/2020 CLINICAL DATA:  Right lower quadrant abdominal pain suspecting appendicitis. Symptoms since Tuesday. EXAM: CT ABDOMEN AND PELVIS WITH CONTRAST TECHNIQUE: Multidetector CT imaging of the abdomen and pelvis was performed using the standard protocol following bolus administration of intravenous contrast. CONTRAST:  Saturday OMNIPAQUE IOHEXOL 300 MG/ML  SOLN COMPARISON:  None. FINDINGS: Lower chest: The lung bases are clear. Small esophageal hiatal hernia. Hepatobiliary: No focal liver  abnormality is seen. Status post cholecystectomy. No biliary dilatation. Pancreas: Unremarkable. No pancreatic ductal dilatation or surrounding inflammatory changes. Spleen: Normal in size without focal abnormality. Adrenals/Urinary Tract: Adrenal glands are unremarkable. Kidneys are normal, without renal calculi, focal lesion, or hydronephrosis. Bladder is unremarkable. Stomach/Bowel: The stomach, small bowel, and colon are not abnormally distended. No wall thickening or inflammatory changes. The appendix is normal. Few scattered colonic diverticula without evidence of diverticulitis. Surgical clips adjacent to the rectum, possibly displaced tubal ligation clips. Vascular/Lymphatic: No significant vascular findings are present. No enlarged abdominal or pelvic lymph nodes. Reproductive: Uterus and bilateral adnexa are unremarkable. Other: No free air or free fluid in the abdomen. Abdominal wall musculature appears intact. Musculoskeletal: No acute or significant osseous findings. IMPRESSION: 1. No acute process demonstrated in the abdomen or pelvis. No evidence of bowel obstruction or  inflammation. Appendix is normal. 2. Small esophageal hiatal hernia. Electronically Signed   By: Burman Nieves M.D.   On: 02/25/2020 22:28    Procedures Procedures (including critical care time)  Medications Ordered in ED Medications  HYDROmorphone (DILAUDID) injection 0.5 mg (has no administration in time range)  sodium chloride 0.9 % bolus 1,000 mL (1,000 mLs Intravenous New Bag/Given 02/25/20 2151)  morphine 4 MG/ML injection 4 mg (4 mg Intravenous Given 02/25/20 2155)  ondansetron (ZOFRAN) injection 4 mg (4 mg Intravenous Given 02/25/20 2152)  iohexol (OMNIPAQUE) 300 MG/ML solution 100 mL (100 mLs Intravenous Contrast Given 02/25/20 2208)    ED Course  I have reviewed the triage vital signs and the nursing notes.  Pertinent labs & imaging results that were available during my care of the patient were reviewed by me and considered in my medical decision making (see chart for details).  Clinical Course as of Feb 24 2333  Thu Feb 25, 2020  2310 Dr. Myriam Jacobson   [BM]    Clinical Course User Index [BM] Elizabeth Palau   MDM Rules/Calculators/A&P                          Additional history obtained from: 1. Nursing notes from this visit. 2. Electronic medical records. ---------------------------- I reviewed and interpreted labs which include: Urinalysis without evidence of infection. Pregnancy test negative. Lipase within normal limits, doubt pancreatitis. CBC within normal limits, no leukocytosis or anemia. CMP shows calcium 8.8 otherwise within normal limits.  No emergent lecture derangement, AKI, LFT elevation or gap  CT AP:  IMPRESSION:  1. No acute process demonstrated in the abdomen or pelvis. No  evidence of bowel obstruction or inflammation. Appendix is normal.  2. Small esophageal hiatal hernia.  - I ordered patient 1 L fluid bolus and 4 mg morphine.  Some improvement in pain.  Patient still holding right lower quadrant on reexamination reports continued pain.   Concern for possible ovarian torsion as cause of right lower quadrant pain.  Low suspicion for PID given patient is not sexually active and denies any abnormal pelvic discharge.  She deferred pelvic examination here today.  Unfortunately ultrasound is unavailable at this facility at this time.  Discussed case with Dr. Freida Busman, advises to contact MAU at [facility] for transfer and pelvic ultrasound there. - 11:10 PM: Discussed case with MAU provider Dr. Myriam Jacobson.  Patient has been accepted for transfer to MAU.  Dr. Myriam Jacobson advised by pelvic not performed at [facility] due to patient preference, they will follow up as needed.  Patient will be transferred by [facility] for  further evaluation.  Patient was reassessed she is resting comfortably no acute distress she is agreeable to transfer by [facility] to MAU for further evaluation.  Note: Portions of this report may have been transcribed using voice recognition software. Every effort was made to ensure accuracy; however, inadvertent computerized transcription errors may still be present. Final Clinical Impression(s) / ED Diagnoses Final diagnoses:  RLQ abdominal pain    Rx / DC Orders ED Discharge Orders    None       Elizabeth PalauMorelli, Aariyana Manz A, PA-C 02/25/20 2335    Lorre NickAllen, Anthony, MD 03/02/20 [phone]

## 2020-02-26 ENCOUNTER — Other Ambulatory Visit: Payer: Self-pay

## 2020-02-26 ENCOUNTER — Emergency Department (HOSPITAL_COMMUNITY): Payer: 59

## 2020-02-26 ENCOUNTER — Observation Stay (HOSPITAL_COMMUNITY)
Admission: EM | Admit: 2020-02-26 | Discharge: 2020-02-27 | Discharge status: Against Medical Advice | Payer: 59 | Attending: Internal Medicine | Admitting: Internal Medicine

## 2020-02-26 ENCOUNTER — Encounter (HOSPITAL_COMMUNITY): Payer: Self-pay | Admitting: Emergency Medicine

## 2020-02-26 ENCOUNTER — Inpatient Hospital Stay (HOSPITAL_COMMUNITY): Payer: 59

## 2020-02-26 ENCOUNTER — Encounter (HOSPITAL_COMMUNITY): Payer: Self-pay | Admitting: Obstetrics and Gynecology

## 2020-02-26 DIAGNOSIS — R1031 Right lower quadrant pain: Secondary | ICD-10-CM

## 2020-02-26 DIAGNOSIS — R748 Abnormal levels of other serum enzymes: Secondary | ICD-10-CM | POA: Diagnosis present

## 2020-02-26 DIAGNOSIS — F419 Anxiety disorder, unspecified: Secondary | ICD-10-CM | POA: Diagnosis not present

## 2020-02-26 DIAGNOSIS — Z79899 Other long term (current) drug therapy: Secondary | ICD-10-CM | POA: Diagnosis not present

## 2020-02-26 DIAGNOSIS — R7989 Other specified abnormal findings of blood chemistry: Secondary | ICD-10-CM

## 2020-02-26 DIAGNOSIS — N179 Acute kidney failure, unspecified: Secondary | ICD-10-CM | POA: Diagnosis present

## 2020-02-26 DIAGNOSIS — F329 Major depressive disorder, single episode, unspecified: Secondary | ICD-10-CM | POA: Insufficient documentation

## 2020-02-26 DIAGNOSIS — F319 Bipolar disorder, unspecified: Secondary | ICD-10-CM | POA: Insufficient documentation

## 2020-02-26 DIAGNOSIS — R1013 Epigastric pain: Secondary | ICD-10-CM

## 2020-02-26 DIAGNOSIS — F3132 Bipolar disorder, current episode depressed, moderate: Secondary | ICD-10-CM

## 2020-02-26 DIAGNOSIS — K859 Acute pancreatitis without necrosis or infection, unspecified: Secondary | ICD-10-CM

## 2020-02-26 DIAGNOSIS — R1084 Generalized abdominal pain: Secondary | ICD-10-CM | POA: Diagnosis not present

## 2020-02-26 DIAGNOSIS — R109 Unspecified abdominal pain: Secondary | ICD-10-CM | POA: Diagnosis present

## 2020-02-26 DIAGNOSIS — Z20822 Contact with and (suspected) exposure to covid-19: Secondary | ICD-10-CM | POA: Insufficient documentation

## 2020-02-26 DIAGNOSIS — E872 Acidosis, unspecified: Secondary | ICD-10-CM | POA: Diagnosis present

## 2020-02-26 LAB — COMPREHENSIVE METABOLIC PANEL
ALT: 559 U/L — ABNORMAL HIGH (ref 0–44)
AST: 702 U/L — ABNORMAL HIGH (ref 15–41)
Albumin: 4.3 g/dL (ref 3.5–5.0)
Alkaline Phosphatase: 73 U/L (ref 38–126)
Anion gap: 16 — ABNORMAL HIGH (ref 5–15)
BUN: 14 mg/dL (ref 6–20)
CO2: 23 mmol/L (ref 22–32)
Calcium: 8.3 mg/dL — ABNORMAL LOW (ref 8.9–10.3)
Chloride: 108 mmol/L (ref 98–111)
Creatinine, Ser: 1.53 mg/dL — ABNORMAL HIGH (ref 0.44–1.00)
GFR calc Af Amer: 45 mL/min — ABNORMAL LOW (ref >60)
GFR calc non Af Amer: 38 mL/min — ABNORMAL LOW (ref >60)
Glucose, Bld: 117 mg/dL — ABNORMAL HIGH (ref 70–99)
Potassium: 3.7 mmol/L (ref 3.5–5.1)
Sodium: 147 mmol/L — ABNORMAL HIGH (ref 135–145)
Total Bilirubin: 0.4 mg/dL (ref 0.3–1.2)
Total Protein: 7 g/dL (ref 6.5–8.1)

## 2020-02-26 LAB — LIPASE, BLOOD: Lipase: 371 U/L — ABNORMAL HIGH (ref 11–51)

## 2020-02-26 LAB — CBC WITH DIFFERENTIAL/PLATELET
Abs Immature Granulocytes: 0.09 10*3/uL — ABNORMAL HIGH (ref 0.00–0.07)
Basophils Absolute: 0 10*3/uL (ref 0.0–0.1)
Basophils Relative: 0 %
Eosinophils Absolute: 0 10*3/uL (ref 0.0–0.5)
Eosinophils Relative: 0 %
HCT: 40.4 % (ref 36.0–46.0)
Hemoglobin: 12.8 g/dL (ref 12.0–15.0)
Immature Granulocytes: 1 %
Lymphocytes Relative: 7 %
Lymphs Abs: 0.9 10*3/uL (ref 0.7–4.0)
MCH: 27.8 pg (ref 26.0–34.0)
MCHC: 31.7 g/dL (ref 30.0–36.0)
MCV: 87.6 fL (ref 80.0–100.0)
Monocytes Absolute: 1 10*3/uL (ref 0.1–1.0)
Monocytes Relative: 8 %
Neutro Abs: 10.8 10*3/uL — ABNORMAL HIGH (ref 1.7–7.7)
Neutrophils Relative %: 84 %
Platelets: 251 10*3/uL (ref 150–400)
RBC: 4.61 MIL/uL (ref 3.87–5.11)
RDW: 13.7 % (ref 11.5–15.5)
WBC: 12.9 10*3/uL — ABNORMAL HIGH (ref 4.0–10.5)
nRBC: 0 % (ref 0.0–0.2)

## 2020-02-26 LAB — LACTIC ACID, PLASMA
Lactic Acid, Venous: 4.6 mmol/L (ref 0.5–1.9)
Lactic Acid, Venous: 2.9 mmol/L (ref 0.5–1.9)

## 2020-02-26 LAB — SARS CORONAVIRUS 2 BY RT PCR (HOSPITAL ORDER, PERFORMED IN ~~LOC~~ HOSPITAL LAB): SARS Coronavirus 2: NEGATIVE

## 2020-02-26 MED ORDER — HYDROMORPHONE HCL 1 MG/ML IJ SOLN
3.0000 mg | Freq: Once | INTRAMUSCULAR | Status: AC
  Administered 2020-02-26: 3 mg via INTRAVENOUS
  Filled 2020-02-26: qty 3

## 2020-02-26 MED ORDER — SODIUM CHLORIDE 0.9 % IV BOLUS
1000.0000 mL | Freq: Once | INTRAVENOUS | Status: AC
  Administered 2020-02-26: 1000 mL via INTRAVENOUS

## 2020-02-26 MED ORDER — ACETAMINOPHEN 325 MG PO TABS
650.0000 mg | ORAL_TABLET | Freq: Four times a day (QID) | ORAL | Status: DC | PRN
  Administered 2020-02-27: 650 mg via ORAL
  Filled 2020-02-26 (×2): qty 2

## 2020-02-26 MED ORDER — SIMETHICONE 80 MG PO CHEW: 80.0000 mg | CHEWABLE_TABLET | Freq: Four times a day (QID) | ORAL | 0 refills | Status: DC | PRN

## 2020-02-26 MED ORDER — ENOXAPARIN SODIUM 40 MG/0.4ML ~~LOC~~ SOLN
40.0000 mg | SUBCUTANEOUS | Status: DC
  Administered 2020-02-27: 40 mg via SUBCUTANEOUS
  Filled 2020-02-26: qty 0.4

## 2020-02-26 MED ORDER — ONDANSETRON HCL 4 MG/2ML IJ SOLN
4.0000 mg | Freq: Once | INTRAMUSCULAR | Status: AC
  Administered 2020-02-26: 4 mg via INTRAVENOUS
  Filled 2020-02-26: qty 2

## 2020-02-26 MED ORDER — DOCUSATE SODIUM 100 MG PO CAPS: 100.0000 mg | ORAL_CAPSULE | Freq: Every day | ORAL | 0 refills | Status: DC

## 2020-02-26 MED ORDER — IOHEXOL 9 MG/ML PO SOLN
ORAL | Status: AC
  Filled 2020-02-26: qty 1000

## 2020-02-26 MED ORDER — ACETAMINOPHEN 650 MG RE SUPP: 650.0000 mg | Freq: Four times a day (QID) | RECTAL | Status: DC | PRN

## 2020-02-26 MED ORDER — IOHEXOL 300 MG/ML  SOLN
100.0000 mL | Freq: Once | INTRAMUSCULAR | Status: AC | PRN
  Administered 2020-02-26: 75 mL via INTRAVENOUS

## 2020-02-26 MED ORDER — HYDROMORPHONE HCL 1 MG/ML IJ SOLN
0.5000 mg | INTRAMUSCULAR | Status: DC | PRN
  Administered 2020-02-27: 0.5 mg via INTRAVENOUS
  Administered 2020-02-27 (×2): 1 mg via INTRAVENOUS
  Filled 2020-02-26 (×3): qty 1

## 2020-02-26 MED ORDER — LACTATED RINGERS IV SOLN: INTRAVENOUS | Status: DC

## 2020-02-26 MED ORDER — SIMETHICONE 80 MG PO CHEW
160.0000 mg | CHEWABLE_TABLET | Freq: Once | ORAL | Status: AC
  Administered 2020-02-26: 160 mg via ORAL
  Filled 2020-02-26: qty 2

## 2020-02-26 MED ORDER — MORPHINE SULFATE (PF) 4 MG/ML IV SOLN
4.0000 mg | Freq: Once | INTRAVENOUS | Status: AC
  Administered 2020-02-26: 4 mg via INTRAVENOUS
  Filled 2020-02-26: qty 1

## 2020-02-26 MED ORDER — FLEET ENEMA 7-19 GM/118ML RE ENEM
1.0000 | ENEMA | Freq: Once | RECTAL | Status: AC
  Administered 2020-02-26: 1 via RECTAL

## 2020-02-26 MED ORDER — PROCHLORPERAZINE EDISYLATE 10 MG/2ML IJ SOLN
10.0000 mg | Freq: Four times a day (QID) | INTRAMUSCULAR | Status: DC | PRN
  Administered 2020-02-27: 10 mg via INTRAVENOUS
  Filled 2020-02-26: qty 2

## 2020-02-26 NOTE — ED Notes (Signed)
Date and time results received: 02/26/20 7:16 PM  (use smartphrase ".now" to insert current time)  Test: Lactic Acid Critical Value: 4.6  Name of Provider Notified: Dr.Messick  Orders Received? Or Actions Taken?:

## 2020-02-26 NOTE — ED Provider Notes (Signed)
[facility]-EMERGENCY DEPT Provider Note   CSN: [id number] Arrival date & time: 02/26/20  1604     History Chief Complaint  Patient presents with  . Hip Pain    Stefanie Galvan is a 54 y.o. female.  54 year old female with prior medical history as detailed below presents for evaluation.  Patient was seen yesterday for similar complaint.  Per medical records patient had right lower quadrant pain yesterday.  Work-up included CT and pelvic ultrasound.  No significant abnormality was found patient was discharged early this morning.  Patient returns now with complaint of worsening pain into the right upper quadrant and epigastrium.  Patient reports significant nausea and vomiting associated with same.  She denies fever.  She denies back pain.  At this time, she specifically denies right hip pain.    The history is provided by the patient and medical records.  Abdominal Pain Pain location:  Epigastric and RUQ Pain quality: aching, shooting and stabbing   Pain radiates to:  Does not radiate Pain severity:  Moderate Onset quality:  Gradual Duration:  3 days Timing:  Constant Progression:  Worsening Chronicity:  New Relieved by:  Nothing Worsened by:  Nothing      Past Medical History:  Diagnosis Date  . Anxiety   . Arthritis   . Depression 2.5 yrs ago   situational dep-no current tx/meds  . TBI (traumatic brain injury) (HCC) 11/10/2005    Patient Active Problem List   Diagnosis Date Noted  . Bipolar 1 disorder, depressed, severe (HCC) 12/22/2018  . Generalized anxiety disorder   . MDD (major depressive disorder), recurrent episode, severe (HCC) 12/15/2018  . Diverticulosis of sigmoid colon 11/30/2018  . Biliary colic 07/01/2018  . Bipolar affective (HCC) 09/24/2017  . Lithium overdose 09/23/2017  . Lithium overdose, intentional self-harm, initial encounter (HCC) 09/22/2017  . QT prolongation 09/22/2017  . Moderate bipolar I disorder, most  recent episode depressed (HCC) 03/19/2017  . Bipolar 2 disorder, major depressive episode (HCC) 03/04/2017    Class: Chronic  . Chronic post-traumatic stress disorder (PTSD) 03/04/2017    Class: Chronic  . DDD (degenerative disc disease), lumbar 03/04/2017  . Arthritis 08/29/2016    Past Surgical History:  Procedure Laterality Date  . BREAST REDUCTION SURGERY  02/25/2012   Procedure: MAMMARY REDUCTION  (BREAST);  Surgeon: Louisa Second, MD;  Location: [facility];  Service: Plastics;  Laterality: Bilateral;  bilateral reduction  . CHOLECYSTECTOMY N/A 07/01/2018   Procedure: LAPAROSCOPIC CHOLECYSTECTOMY;  Surgeon: Sung Amabile, DO;  Location: [facility];  Service: General;  Laterality: N/A;  . DILATION AND CURETTAGE OF UTERUS  2001  . TEMPOROMANDIBULAR JOINT SURGERY  1984; 1996   X 2; bilateral  . TUBAL LIGATION  2003     OB History    Gravida  7   Para  3   Term  3   Preterm      AB  4   Living  3     SAB  1   TAB  3   Ectopic      Multiple      Live Births  3           Family History  Problem Relation Age of Onset  . Depression Father   . Anxiety disorder Brother   . Depression Maternal Grandmother     Social History   Tobacco Use  . Smoking status: Never Smoker  . Smokeless tobacco: Never Used  Vaping Use  .  Vaping Use: Never used  Substance Use Topics  . Alcohol use: Not Currently  . Drug use: No    Home Medications Prior to Admission medications   Medication Sig Start Date End Date Taking? Authorizing Provider  cyclobenzaprine (FLEXERIL) 5 MG tablet Take 1 tablet (5 mg total) by mouth 3 (three) times daily. Patient not taking: Reported on 10/14/2019 12/18/18   Aldean Baker, NP  docusate sodium (COLACE) 100 MG capsule Take 1 capsule (100 mg total) by mouth daily. 02/26/20   Sharyon Cable, CNM  escitalopram (LEXAPRO) 5 MG tablet Take 1 tablet (5 mg total) by mouth daily. 01/15/20   Arfeen, Phillips Grout, MD  hydrOXYzine (VISTARIL)  25 MG capsule Take 1 capsule (25 mg total) by mouth daily as needed for anxiety. Patient not taking: Reported on 01/15/2020 07/16/19   Arfeen, Phillips Grout, MD  ibuprofen (ADVIL) 200 MG tablet Take 600 mg by mouth every 6 (six) hours as needed for moderate pain.    [provider]  lamoTRIgine (LAMICTAL) 200 MG tablet Take 1 tablet (200 mg total) by mouth daily. 01/15/20   Arfeen, Phillips Grout, MD  QUEtiapine (SEROQUEL) 200 MG tablet Take 1 tablet (200 mg total) by mouth at bedtime. 01/15/20   Arfeen, Phillips Grout, MD  QUEtiapine (SEROQUEL) 25 MG tablet Take one tab daily and 2nd if needed Patient taking differently: Take 25 mg by mouth daily. 2nd if needed 01/15/20   Arfeen, Phillips Grout, MD  simethicone (GAS-X) 80 MG chewable tablet Chew 1 tablet (80 mg total) by mouth every 6 (six) hours as needed for flatulence. 02/26/20   Sharyon Cable, CNM    Allergies    Anaprox [naproxen sodium]  Review of Systems   Review of Systems  Gastrointestinal: Positive for abdominal pain.  All other systems reviewed and are negative.   Physical Exam Updated Vital Signs BP 98/75   Pulse 93   Temp 98.9 F (37.2 C) (Oral)   Resp 15   SpO2 100%   Physical Exam Vitals and nursing note reviewed.  Constitutional:      General: She is not in acute distress.    Appearance: Normal appearance. She is well-developed.  HENT:     Head: Normocephalic and atraumatic.  Eyes:     Conjunctiva/sclera: Conjunctivae normal.     Pupils: Pupils are equal, round, and reactive to light.  Cardiovascular:     Rate and Rhythm: Normal rate and regular rhythm.     Heart sounds: Normal heart sounds.  Pulmonary:     Effort: Pulmonary effort is normal. No respiratory distress.     Breath sounds: Normal breath sounds.  Abdominal:     General: There is no distension.     Palpations: Abdomen is soft.     Tenderness: There is abdominal tenderness.  Musculoskeletal:        General: No deformity. Normal range of motion.     Cervical back:  Normal range of motion and neck supple.  Skin:    General: Skin is warm and dry.  Neurological:     Mental Status: She is alert and oriented to person, place, and time.     ED Results / Procedures / Treatments   Labs (all labs ordered are listed, but only abnormal results are displayed) Labs Reviewed  LACTIC ACID, PLASMA - Abnormal; Notable for the following components:      Result Value   Lactic Acid, Venous 4.6 (*)    All other components within normal  limits  COMPREHENSIVE METABOLIC PANEL - Abnormal; Notable for the following components:   Sodium 147 (*)    Glucose, Bld 117 (*)    Creatinine, Ser 1.53 (*)    Calcium 8.3 (*)    AST 702 (*)    ALT 559 (*)    GFR calc non Af Amer 38 (*)    GFR calc Af Amer 45 (*)    Anion gap 16 (*)    All other components within normal limits  LIPASE, BLOOD - Abnormal; Notable for the following components:   Lipase 371 (*)    All other components within normal limits  CBC WITH DIFFERENTIAL/PLATELET - Abnormal; Notable for the following components:   WBC 12.9 (*)    Neutro Abs 10.8 (*)    Abs Immature Granulocytes 0.09 (*)    All other components within normal limits  SARS CORONAVIRUS 2 BY RT PCR (HOSPITAL ORDER, PERFORMED IN [facility])  LACTIC ACID, PLASMA    EKG EKG Interpretation  Date/Time:  Friday February 26 2020 19:51:11 EDT Ventricular Rate:  103 PR Interval:    QRS Duration: 98 QT Interval:  371 QTC Calculation: 486 R Axis:   71 Text Interpretation: Sinus tachycardia Low voltage, precordial leads Borderline prolonged QT interval 12 Lead; Mason-Likar Confirmed by Kristine Royal [phone]) on 02/26/2020 7:56:14 PM   Radiology US Transvaginal Non-OB  Result Date: 02/26/2020 CLINICAL DATA:  Right lower quadrant pain for 3 days EXAM: TRANSABDOMINAL AND TRANSVAGINAL ULTRASOUND OF PELVIS TECHNIQUE: Both transabdominal and transvaginal ultrasound examinations of the pelvis were performed. Transabdominal technique  was performed for global imaging of the pelvis including uterus, ovaries, adnexal regions, and pelvic cul-de-sac. It was necessary to proceed with endovaginal exam following the transabdominal exam to visualize the adnexal structures. COMPARISON:  02/25/2020 CT, 02/26/2020 ultrasound FINDINGS: Uterus Measurements: 7.3 x 3.0 x 3.7 cm = volume: 42 mL. No fibroids or other mass visualized. Endometrium Thickness: 2 mm.  No focal abnormality visualized. Right ovary Not identified due to bowel gas. Left ovary Not identified due to bowel gas. Other findings No abnormal free fluid. IMPRESSION: 1. Unremarkable appearance of the uterus. 2. Nonvisualization of the adnexal structures due to bowel gas. Electronically Signed   By: Sharlet Salina M.D.   On: 02/26/2020 19:20   US Pelvis Complete  Result Date: 02/26/2020 CLINICAL DATA:  Right lower quadrant pain for 3 days EXAM: TRANSABDOMINAL AND TRANSVAGINAL ULTRASOUND OF PELVIS TECHNIQUE: Both transabdominal and transvaginal ultrasound examinations of the pelvis were performed. Transabdominal technique was performed for global imaging of the pelvis including uterus, ovaries, adnexal regions, and pelvic cul-de-sac. It was necessary to proceed with endovaginal exam following the transabdominal exam to visualize the adnexal structures. COMPARISON:  02/25/2020 CT, 02/26/2020 ultrasound FINDINGS: Uterus Measurements: 7.3 x 3.0 x 3.7 cm = volume: 42 mL. No fibroids or other mass visualized. Endometrium Thickness: 2 mm.  No focal abnormality visualized. Right ovary Not identified due to bowel gas. Left ovary Not identified due to bowel gas. Other findings No abnormal free fluid. IMPRESSION: 1. Unremarkable appearance of the uterus. 2. Nonvisualization of the adnexal structures due to bowel gas. Electronically Signed   By: Sharlet Salina M.D.   On: 02/26/2020 19:20   CT Abdomen Pelvis W Contrast  Result Date: 02/26/2020 CLINICAL DATA:  Right lower quadrant pain worsening since  yesterday EXAM: CT ABDOMEN AND PELVIS WITH CONTRAST TECHNIQUE: Multidetector CT imaging of the abdomen and pelvis was performed using the standard protocol following bolus administration of  intravenous contrast. CONTRAST:  75mL OMNIPAQUE IOHEXOL 300 MG/ML  SOLN COMPARISON:  02/25/2020 FINDINGS: Lower chest: Mild dependent changes in the lung bases. Hepatobiliary: Diffuse fatty infiltration of the liver. No focal liver lesions. Portal veins are patent. Gallbladder is surgically absent. No bile duct dilatation. Pancreas: Unremarkable. No pancreatic ductal dilatation or surrounding inflammatory changes. Spleen: Normal in size without focal abnormality. Adrenals/Urinary Tract: No adrenal gland nodules. Kidneys are symmetrical. Nephrograms are homogeneous and symmetrical. No hydronephrosis or hydroureter. Bladder is filled with contrast material but is otherwise unremarkable. Stomach/Bowel: Stomach, small bowel, and colon are not abnormally distended. No wall thickening or inflammatory changes are appreciated. The appendix is well demonstrated and is normal. Vascular/Lymphatic: No significant vascular findings are present. No enlarged abdominal or pelvic lymph nodes. Reproductive: Uterus and ovaries are not enlarged. Small amount of free fluid in the right pelvis is likely physiologic. Surgical clips in the low pelvis. Other: No free air in the abdomen. Abdominal wall musculature appears intact. Musculoskeletal: No acute or significant osseous findings. IMPRESSION: 1. No acute process demonstrated in the abdomen or pelvis. No evidence of bowel obstruction or inflammation. Appendix is normal. 2. Diffuse fatty infiltration of the liver. 3. Small amount of free fluid in the right pelvis is likely physiologic. Electronically Signed   By: Burman NievesWilliam  Stevens M.D.   On: 02/26/2020 19:45   CT ABDOMEN PELVIS W CONTRAST  Result Date: 02/25/2020 CLINICAL DATA:  Right lower quadrant abdominal pain suspecting appendicitis. Symptoms  since Tuesday. EXAM: CT ABDOMEN AND PELVIS WITH CONTRAST TECHNIQUE: Multidetector CT imaging of the abdomen and pelvis was performed using the standard protocol following bolus administration of intravenous contrast. CONTRAST:  100mL OMNIPAQUE IOHEXOL 300 MG/ML  SOLN COMPARISON:  None. FINDINGS: Lower chest: The lung bases are clear. Small esophageal hiatal hernia. Hepatobiliary: No focal liver abnormality is seen. Status post cholecystectomy. No biliary dilatation. Pancreas: Unremarkable. No pancreatic ductal dilatation or surrounding inflammatory changes. Spleen: Normal in size without focal abnormality. Adrenals/Urinary Tract: Adrenal glands are unremarkable. Kidneys are normal, without renal calculi, focal lesion, or hydronephrosis. Bladder is unremarkable. Stomach/Bowel: The stomach, small bowel, and colon are not abnormally distended. No wall thickening or inflammatory changes. The appendix is normal. Few scattered colonic diverticula without evidence of diverticulitis. Surgical clips adjacent to the rectum, possibly displaced tubal ligation clips. Vascular/Lymphatic: No significant vascular findings are present. No enlarged abdominal or pelvic lymph nodes. Reproductive: Uterus and bilateral adnexa are unremarkable. Other: No free air or free fluid in the abdomen. Abdominal wall musculature appears intact. Musculoskeletal: No acute or significant osseous findings. IMPRESSION: 1. No acute process demonstrated in the abdomen or pelvis. No evidence of bowel obstruction or inflammation. Appendix is normal. 2. Small esophageal hiatal hernia. Electronically Signed   By: Burman NievesWilliam  Stevens M.D.   On: 02/25/2020 22:28   US PELVIC DOPPLER (TORSION R/O OR MASS ARTERIAL FLOW)  Result Date: 02/26/2020 CLINICAL DATA:  Initial evaluation for acute right lower quadrant pain. EXAM: TRANSABDOMINAL AND TRANSVAGINAL ULTRASOUND OF PELVIS DOPPLER ULTRASOUND OF OVARIES TECHNIQUE: Both transabdominal and transvaginal ultrasound  examinations of the pelvis were performed. Transabdominal technique was performed for global imaging of the pelvis including uterus, ovaries, adnexal regions, and pelvic cul-de-sac. It was necessary to proceed with endovaginal exam following the transabdominal exam to visualize the uterus, endometrium, and ovaries. Color and duplex Doppler ultrasound was utilized to evaluate blood flow to the ovaries. COMPARISON:  Prior CT from 02/25/2020. FINDINGS: Uterus Measurements: 7.0 x 3.1 x 4.1 cm = volume: 47.4 mL.  Uterus is anteverted. No fibroids or other mass visualized. Endometrium Thickness: 3.8 mm.  No focal abnormality visualized. Right ovary Evaluation of the right adnexa technically limited by shadowing from overlying bowel gas. The right ovary is not confidently identified. The ultrasound technologist has made note of a 2.6 x 1.2 x 2.1 cm hypoechoic structure in the right adnexa (image 51), of uncertain etiology, but could reflect a small portion of bowel. No blood flow is seen within this structure. Certainly, no significant inflammatory changes seen within the right adnexa as would be expected in the setting of ovarian torsion. Left ovary Not visualized.  No adnexal mass. Neither ovary confidently identified, limiting assessment for possible torsion. Other findings No abnormal free fluid. IMPRESSION: 1. Technically limited exam due to shadowing from overlying bowel gas. 2. The right ovary is not confidently identified. 2.6 cm hypoechoic structure within the right adnexa as discussed above, nonspecific, but could reflect a small portion of bowel. No convincing evidence for ovarian torsion identified. If there remains high clinical suspicion for possible occult torsion or other pathology, a short interval follow-up ultrasound may be helpful for further evaluation and to evaluate for interval changes. 3. Nonvisualization of the left ovary. No other adnexal mass or free fluid. 4. Normal sonographic appearance of the  uterus and endometrium. No other acute abnormality identified. Electronically Signed   By: Rise Mu M.D.   On: 02/26/2020 01:50   US PELVIC COMPLETE WITH TRANSVAGINAL  Result Date: 02/26/2020 CLINICAL DATA:  Initial evaluation for acute right lower quadrant pain. EXAM: TRANSABDOMINAL AND TRANSVAGINAL ULTRASOUND OF PELVIS DOPPLER ULTRASOUND OF OVARIES TECHNIQUE: Both transabdominal and transvaginal ultrasound examinations of the pelvis were performed. Transabdominal technique was performed for global imaging of the pelvis including uterus, ovaries, adnexal regions, and pelvic cul-de-sac. It was necessary to proceed with endovaginal exam following the transabdominal exam to visualize the uterus, endometrium, and ovaries. Color and duplex Doppler ultrasound was utilized to evaluate blood flow to the ovaries. COMPARISON:  Prior CT from 02/25/2020. FINDINGS: Uterus Measurements: 7.0 x 3.1 x 4.1 cm = volume: 47.4 mL. Uterus is anteverted. No fibroids or other mass visualized. Endometrium Thickness: 3.8 mm.  No focal abnormality visualized. Right ovary Evaluation of the right adnexa technically limited by shadowing from overlying bowel gas. The right ovary is not confidently identified. The ultrasound technologist has made note of a 2.6 x 1.2 x 2.1 cm hypoechoic structure in the right adnexa (image 51), of uncertain etiology, but could reflect a small portion of bowel. No blood flow is seen within this structure. Certainly, no significant inflammatory changes seen within the right adnexa as would be expected in the setting of ovarian torsion. Left ovary Not visualized.  No adnexal mass. Neither ovary confidently identified, limiting assessment for possible torsion. Other findings No abnormal free fluid. IMPRESSION: 1. Technically limited exam due to shadowing from overlying bowel gas. 2. The right ovary is not confidently identified. 2.6 cm hypoechoic structure within the right adnexa as discussed above,  nonspecific, but could reflect a small portion of bowel. No convincing evidence for ovarian torsion identified. If there remains high clinical suspicion for possible occult torsion or other pathology, a short interval follow-up ultrasound may be helpful for further evaluation and to evaluate for interval changes. 3. Nonvisualization of the left ovary. No other adnexal mass or free fluid. 4. Normal sonographic appearance of the uterus and endometrium. No other acute abnormality identified. Electronically Signed   By: Rise Mu M.D.   On: 02/26/2020  01:50    Procedures Procedures (including critical care time)  Medications Ordered in ED Medications  iohexol (OMNIPAQUE) 9 MG/ML oral solution (has no administration in time range)  sodium chloride 0.9 % bolus 1,000 mL (has no administration in time range)  sodium chloride 0.9 % bolus 1,000 mL (1,000 mLs Intravenous New Bag/Given 02/26/20 1828)  ondansetron (ZOFRAN) injection 4 mg (4 mg Intravenous Given 02/26/20 1836)  morphine 4 MG/ML injection 4 mg (4 mg Intravenous Given 02/26/20 1951)  sodium chloride 0.9 % bolus 1,000 mL (1,000 mLs Intravenous New Bag/Given 02/26/20 1952)  iohexol (OMNIPAQUE) 300 MG/ML solution 100 mL (75 mLs Intravenous Contrast Given 02/26/20 1925)    ED Course  I have reviewed the triage vital signs and the nursing notes.  Pertinent labs & imaging results that were available during my care of the patient were reviewed by me and considered in my medical decision making (see chart for details).  Clinical Course as of Feb 26 2016  Fri Feb 26, 2020  1647 BP(!): 79/63 [PM]    Clinical Course User Index [PM] Wynetta Fines, MD   MDM Rules/Calculators/A&P                          MDM  Screen complete  Stefanie Galvan was evaluated in Emergency Department on 02/26/2020 for the symptoms described in the history of present illness. She was evaluated in the context of the global COVID-19 pandemic, which  necessitated consideration that the patient might be at risk for infection with the SARS-CoV-2 virus that causes COVID-19. Institutional protocols and algorithms that pertain to the evaluation of patients at risk for COVID-19 are in a state of rapid change based on information released by regulatory bodies including the CDC and federal and state organizations. These policies and algorithms were followed during the patient's care in the ED.  Patient with epigastric abdominal pain and now with elevated lipase and LFT's.   Will admit to hospitalist service for further evaluation and treatment.     Final Clinical Impression(s) / ED Diagnoses Final diagnoses:  Epigastric pain    Rx / DC Orders ED Discharge Orders    None       Wynetta Fines, MD 03/04/20 1448

## 2020-02-26 NOTE — Progress Notes (Signed)
Written and verbal d/c instructions given. PT wheeled out to front where her son picked her up.

## 2020-02-26 NOTE — Progress Notes (Signed)
Steward Drone CNM in earlier to discuss test results and d/c plan.

## 2020-02-26 NOTE — MAU Note (Signed)
Pt prefers to do her own Fleets enema. Instructed pt and voices understanding. When finished will call RN and will then give Dilaudid. Pt agrees with POC

## 2020-02-26 NOTE — ED Triage Notes (Addendum)
Per EMS-states she was seen yesterday for similar symptoms-complaining of new right hip pain-no injury-500cc of NS given in route

## 2020-02-26 NOTE — MAU Provider Note (Addendum)
History     CSN: [id number]  Arrival date and time: 02/25/20 1320  Chief Complaint  Patient presents with  . Abdominal Pain   Stefanie Galvan is a 54 y.o. non pregnant female who [facility] Patient transferred to MAU for evaluation of possible torsion. [facility] right [facility] quadrant pain that started 3 weeks ago, which she initially ignored because it was a mild ache. She reports pain worsened 2-3 days ago and has continued to gradually worsen. She currently describes the pain as severe constant sharp pain. Rates pain 8/10- has [facility]aud[facility] and reports pain continues to be 8/10.  Reports associated nausea and constipation is associated with patient. [facility] but [facility] "[facility] very [facility] amount".   [facility]   OB History    Gravida  7   Para  3   Term  3   Preterm      AB  4   Living  3     SAB  1   TAB  3   Ectopic      Multiple      Live Births  3           Past Medical History:  Diagnosis Date  . Anxiety   . Arthritis   . Depression 2.5 yrs ago   situational dep-no current tx/meds  . TBI (traumatic brain injury) (HCC) 11/10/2005    Past Surgical History:  Procedure Laterality Date  . BREAST REDUCTION SURGERY  02/25/2012   Procedure: MAMMARY REDUCTION  (BREAST);  Surgeon: Louisa SecondGerald Truesdale, MD;  Location: [facility];  Service: Plastics;  Laterality: Bilateral;  bilateral reduction  . CHOLECYSTECTOMY N/A 07/01/2018   Procedure: LAPAROSCOPIC CHOLECYSTECTOMY;  Surgeon: Sung AmabileSakai, Isami, DO;  Location: [facility];  Service: General;  Laterality: N/A;  . DILATION AND CURETTAGE OF UTERUS  2001  . TEMPOROMANDIBULAR JOINT SURGERY  1984; 1996   X 2; bilateral  . TUBAL LIGATION  2003    Family History  Problem Relation Age of Onset  . Depression Father   . Anxiety disorder Brother   . Depression  Maternal Grandmother     Social History   Tobacco Use  . Smoking status: Never Smoker  . Smokeless tobacco: Never Used  Vaping Use  . Vaping Use: Never used  Substance Use Topics  . Alcohol use: Not Currently  . Drug use: No    Allergies:  Allergies  Allergen Reactions  . Anaprox [Naproxen Sodium] Anaphylaxis and Shortness Of Breath    Heart palpitations, shortness of breath, generalized swelling    Medications Prior to Admission  Medication Sig Dispense Refill Last Dose  . escitalopram (LEXAPRO) 5 MG tablet Take 1 tablet (5 mg total) by mouth daily. 30 tablet 2 02/24/2020 at Unknown time  . ibuprofen (ADVIL) 200 MG tablet Take 600 mg by mouth every 6 (six) hours as needed for moderate pain.   02/25/2020 at Unknown time  . lamoTRIgine (LAMICTAL) 200 MG tablet Take 1 tablet (200 mg total) by mouth daily. 30 tablet 2 02/24/2020 at Unknown time  . QUEtiapine (SEROQUEL) 200 MG tablet Take 1 tablet (200 mg total) by mouth at bedtime. 30 tablet 2 02/24/2020 at Unknown time  . QUEtiapine (SEROQUEL) 25 MG tablet Take one tab daily and 2nd if needed (Patient taking differently: Take 25 mg by mouth daily. 2nd if  needed) 60 tablet 1 02/24/2020 at Unknown time  . cyclobenzaprine (FLEXERIL) 5 MG tablet Take 1 tablet (5 mg total) by mouth 3 (three) times daily. (Patient not taking: Reported on 10/14/2019) 45 tablet 0   . hydrOXYzine (VISTARIL) 25 MG capsule Take 1 capsule (25 mg total) by mouth daily as needed for anxiety. (Patient not taking: Reported on 01/15/2020) 30 capsule 0     Review of Systems  Constitutional: Negative.   Respiratory: Negative.   Cardiovascular: Negative.   Gastrointestinal: Positive for abdominal pain, constipation and nausea.  Genitourinary: Negative.   Musculoskeletal: Negative.   Neurological: Negative.   Psychiatric/Behavioral: Negative.    Physical Exam   Blood pressure 140/79, pulse (!) 101, temperature 97.6 F (36.4 C), resp. rate 18, height 5\' 7"  (1.702 m),  weight 81.6 kg, SpO2 97 %.  Physical Exam Vitals and nursing note reviewed.  Constitutional:      General: She is in acute distress.     Comments: Patient tearful upon arrival to MAU   HENT:     Head: Normocephalic.  Cardiovascular:     Rate and Rhythm: Normal rate and regular rhythm.  Pulmonary:     Effort: Pulmonary effort is normal.     Breath sounds: Normal breath sounds.  Abdominal:     Palpations: Abdomen is soft. There is no mass.     Tenderness: There is abdominal tenderness. There is guarding. There is no right CVA tenderness or left CVA tenderness.     Comments: Tenderness and guarding in RLQ  Genitourinary:    Comments: Deferred Skin:    General: Skin is warm and dry.  Neurological:     Mental Status: She is alert and oriented to person, place, and time.  Psychiatric:        Mood and Affect: Mood normal.        Behavior: Behavior normal.        Thought Content: Thought content normal.    MAU Course  Procedures  MDM ordered upon arrival to MAU to r/o torsion Orders Placed This Encounter  Procedures  . US PELVIC DOPPLER (TORSION R/O OR MASS ARTERIAL FLOW)  . US PELVIC COMPLETE WITH TRANSVAGINAL   US reviewed:  US PELVIC DOPPLER (TORSION R/O OR MASS ARTERIAL FLOW)  Result Date: 02/26/2020 CLINICAL DATA:  Initial evaluation for acute right lower quadrant pain. EXAM: TRANSABDOMINAL AND TRANSVAGINAL ULTRASOUND OF PELVIS DOPPLER ULTRASOUND OF OVARIES TECHNIQUE: Both transabdominal and transvaginal ultrasound examinations of the pelvis were performed. Transabdominal technique was performed for global imaging of the pelvis including uterus, ovaries, adnexal regions, and pelvic cul-de-sac. It was necessary to proceed with endovaginal exam following the transabdominal exam to visualize the uterus, endometrium, and ovaries. Color and duplex Doppler ultrasound was utilized to evaluate blood flow to the ovaries. COMPARISON:  Prior CT from 02/25/2020. FINDINGS: Uterus  Measurements: 7.0 x 3.1 x 4.1 cm = volume: 47.4 mL. Uterus is anteverted. No fibroids or other mass visualized. Endometrium Thickness: 3.8 mm.  No focal abnormality visualized. Right ovary Evaluation of the right adnexa technically limited by shadowing from overlying bowel gas. The right ovary is not confidently identified. The ultrasound technologist has made note of a 2.6 x 1.2 x 2.1 cm hypoechoic structure in the right adnexa (image 51), of uncertain etiology, but could reflect a small portion of bowel. No blood flow is seen within this structure. Certainly, no significant inflammatory changes seen within the right adnexa as would be expected in the setting of ovarian torsion.  Left ovary Not visualized.  No adnexal mass. Neither ovary confidently identified, limiting assessment for possible torsion. Other findings No abnormal free fluid. IMPRESSION: 1. Technically limited exam due to shadowing from overlying bowel gas. 2. The right ovary is not confidently identified. 2.6 cm hypoechoic structure within the right adnexa as discussed above, nonspecific, but could reflect a small portion of bowel. No convincing evidence for ovarian torsion identified. If there remains high clinical suspicion for possible occult torsion or other pathology, a short interval follow-up ultrasound may be helpful for further evaluation and to evaluate for interval changes. 3. Nonvisualization of the left ovary. No other adnexal mass or free fluid. 4. Normal sonographic appearance of the uterus and endometrium. No other acute abnormality identified. Electronically Signed   By: Rise Mu M.D.   On: 02/26/2020 01:50   US PELVIC COMPLETE WITH TRANSVAGINAL  Result Date: 02/26/2020 CLINICAL DATA:  Initial evaluation for acute right lower quadrant pain. EXAM: TRANSABDOMINAL AND TRANSVAGINAL ULTRASOUND OF PELVIS DOPPLER ULTRASOUND OF OVARIES TECHNIQUE: Both transabdominal and transvaginal ultrasound examinations of the pelvis were  performed. Transabdominal technique was performed for global imaging of the pelvis including uterus, ovaries, adnexal regions, and pelvic cul-de-sac. It was necessary to proceed with endovaginal exam following the transabdominal exam to visualize the uterus, endometrium, and ovaries. Color and duplex Doppler ultrasound was utilized to evaluate blood flow to the ovaries. COMPARISON:  Prior CT from 02/25/2020. FINDINGS: Uterus Measurements: 7.0 x 3.1 x 4.1 cm = volume: 47.4 mL. Uterus is anteverted. No fibroids or other mass visualized. Endometrium Thickness: 3.8 mm.  No focal abnormality visualized. Right ovary Evaluation of the right adnexa technically limited by shadowing from overlying bowel gas. The right ovary is not confidently identified. The ultrasound technologist has made note of a 2.6 x 1.2 x 2.1 cm hypoechoic structure in the right adnexa (image 51), of uncertain etiology, but could reflect a small portion of bowel. No blood flow is seen within this structure. Certainly, no significant inflammatory changes seen within the right adnexa as would be expected in the setting of ovarian torsion. Left ovary Not visualized.  No adnexal mass. Neither ovary confidently identified, limiting assessment for possible torsion. Other findings No abnormal free fluid. IMPRESSION: 1. Technically limited exam due to shadowing from overlying bowel gas. 2. The right ovary is not confidently identified. 2.6 cm hypoechoic structure within the right adnexa as discussed above, nonspecific, but could reflect a small portion of bowel. No convincing evidence for ovarian torsion identified. If there remains high clinical suspicion for possible occult torsion or other pathology, a short interval follow-up ultrasound may be helpful for further evaluation and to evaluate for interval changes. 3. Nonvisualization of the left ovary. No other adnexal mass or free fluid. 4. Normal sonographic appearance of the uterus and endometrium. No  other acute abnormality identified. Electronically Signed   By: Rise Mu M.D.   On: 02/26/2020 01:50   Consult with Dr Jolayne Panther @0200 , no concern for ovarian torsion at this time, possibility of diverticulitis. Recommends 4mg  dilaudid for pain. Discussed with Dr thought process of simethicone and enema d/t overlying bowel gas on . Dr Jolayne Panther agrees with plan of care.   Meds ordered this encounter  Medications  . HYDROmorphone (DILAUDID) injection 3 mg  . simethicone (MYLICON) chewable tablet 160 mg  . sodium phosphate (FLEET) 7-19 GM/118ML enema 1 enema  . ondansetron (ZOFRAN) injection 4 mg  . simethicone (GAS-X) 80 MG chewable tablet    Sig: Chew 1  tablet (80 mg total) by mouth every 6 (six) hours as needed for flatulence.    Dispense:  30 tablet    Refill:  0    Order Specific Question:   Supervising Provider    Answer:   CONSTANT, PEGGY [[id number]]  . docusate sodium (COLACE) 100 MG capsule    Sig: Take 1 capsule (100 mg total) by mouth daily.    Dispense:  60 capsule    Refill:  0    Order Specific Question:   Supervising Provider    Answer:   CONSTANT, PEGGY [[id number]]   Treatments in MAU included - simethicone, fleet enema, and dilaudid IV.  Reassessment after treatments in MAU, patient reports pain down to 3-4/10.   Educated and discussed with patient that pain may be coming from GI problem, recommended colace daily and simethicone for home use with referral to GI for follow up. Patient verbalizes understanding and agrees with plan of care.  Pt stable at time of discharge.   Assessment and Plan   1. RLQ abdominal pain    Discharge home Follow up as scheduled in the office- referral placed  Rx for Colace and Simethicone sent to pharmacy of choice   Follow-up Information    [facility]. Schedule an appointment as soon as possible for a visit.   Specialty: Gastroenterology Contact information: [address] [place]  [id number] [phone]               Allergies as of 02/26/2020      Reactions   Anaprox [naproxen Sodium] Anaphylaxis, Shortness Of Breath   Heart palpitations, shortness of breath, generalized swelling      Medication List    TAKE these medications   cyclobenzaprine 5 MG tablet Commonly known as: FLEXERIL Take 1 tablet (5 mg total) by mouth 3 (three) times daily.   docusate sodium 100 MG capsule Commonly known as: COLACE Take 1 capsule (100 mg total) by mouth daily.   escitalopram 5 MG tablet Commonly known as: LEXAPRO Take 1 tablet (5 mg total) by mouth daily.   hydrOXYzine 25 MG capsule Commonly known as: VISTARIL Take 1 capsule (25 mg total) by mouth daily as needed for anxiety.   ibuprofen 200 MG tablet Commonly known as: ADVIL Take 600 mg by mouth every 6 (six) hours as needed for moderate pain.   lamoTRIgine 200 MG tablet Commonly known as: LAMICTAL Take 1 tablet (200 mg total) by mouth daily.   QUEtiapine 200 MG tablet Commonly known as: SEROQUEL Take 1 tablet (200 mg total) by mouth at bedtime. What changed: Another medication with the same name was changed. Make sure you understand how and when to take each.   QUEtiapine 25 MG tablet Commonly known as: SEROQUEL Take one tab daily and 2nd if needed What changed:   how much to take  how to take this  when to take this  additional instructions   simethicone 80 MG chewable tablet Commonly known as: Gas-X Chew 1 tablet (80 mg total) by mouth every 6 (six) hours as needed for flatulence.       Sharyon Cable CNM 02/26/2020, 4:21 AM

## 2020-02-26 NOTE — MAU Note (Signed)
Pt states had small amt of results from enema. Tolerated enema well.

## 2020-02-26 NOTE — MAU Note (Signed)
Checked on pt who states she is still nauseated. Earlier Vomited about 100 green fld. RN in room and watched pt who soon began dozing and snoring

## 2020-02-26 NOTE — MAU Note (Signed)
PT sweaty with dry heaves. Veronic Aundria Rud CNM aware. WIll give IV Zofran. Cool cloth to pt.

## 2020-02-26 NOTE — H&P (Signed)
History and Physical    Stefanie Galvan ZOX:[id number] DOB: June 19, 1965 DOA: 02/26/2020  PCP: Alain Honey, MD  Patient coming from: Home  I have personally briefly reviewed patient's old medical records in [facility]  Chief Complaint: Abdominal pain  HPI: Stefanie Galvan is a 54 y.o. female with medical history significant of bipolar disorder, was recently seen in the hospital last night with abdominal pain.  Patient reports onset of symptoms approximately 3 days ago.  She had persistent abdominal pain and associated nausea, but no vomiting.  She is not aware of any fevers.  Started having shortness of breath or cough.  No dysuria.  No diarrhea.  She initially came to the emergency room on the evening of 9/10 CT scan of the abdomen and pelvis was unrevealing.  Due to concerns for ovarian torsion, she was [facility] for further evaluation with ultrasound.  Pelvic ultrasound did not indicate any evidence of torsion and she was treated symptomatically.  Patient did feel better and discharged home.  She will return to the hospital within 12 hours with recurrent abdominal pain.  At this point, she describes it as a band across her entire abdomen center.  She does have tenderness in the right upper quadrant.  Minimal p.o. intake in the past several days.  ED Course: On arrival to the emergency room she was noted to be hypotensive.  Liver enzymes and lipase were elevated.  CT scan of the abdomen was repeated that did not show any acute findings.  Pelvic ultrasound also repeated that was unrevealing.  The elevated at 4.6.  She was started on IV fluids and referred for admission.  Review of Systems:  Review of Systems  Constitutional: Negative for chills and fever.  HENT: Negative for congestion and sore throat.   Eyes: Negative for blurred vision and double vision.  Respiratory: Positive for cough. Negative for sputum production, shortness of breath and wheezing.    Cardiovascular: Negative for chest pain.  Gastrointestinal: Positive for abdominal pain, constipation and nausea. Negative for blood in stool, diarrhea and vomiting.  Genitourinary: Negative for dysuria.  Neurological: Positive for weakness.       Past Medical History:  Diagnosis Date  . Anxiety   . Arthritis   . Depression 2.5 yrs ago   situational dep-no current tx/meds  . TBI (traumatic brain injury) (HCC) 11/10/2005    Past Surgical History:  Procedure Laterality Date  . BREAST REDUCTION SURGERY  02/25/2012   Procedure: MAMMARY REDUCTION  (BREAST);  Surgeon: Louisa Second, MD;  Location: [facility];  Service: Plastics;  Laterality: Bilateral;  bilateral reduction  . CHOLECYSTECTOMY N/A 07/01/2018   Procedure: LAPAROSCOPIC CHOLECYSTECTOMY;  Surgeon: Sung Amabile, DO;  Location: [facility];  Service: General;  Laterality: N/A;  . DILATION AND CURETTAGE OF UTERUS  2001  . TEMPOROMANDIBULAR JOINT SURGERY  1984; 1996   X 2; bilateral  . TUBAL LIGATION  2003    Social History:  reports that she has never smoked. She has never used smokeless tobacco. She reports previous alcohol use. She reports that she does not use drugs.  Allergies  Allergen Reactions  . Anaprox [Naproxen Sodium] Anaphylaxis and Shortness Of Breath    Heart palpitations, shortness of breath, generalized swelling    Family History  Problem Relation Age of Onset  . Depression Father   . Anxiety disorder Brother   . Depression Maternal Grandmother      Prior to Admission medications  Medication Sig Start Date End Date Taking? Authorizing Provider  docusate sodium (COLACE) 100 MG capsule Take 1 capsule (100 mg total) by mouth daily. 02/26/20  Yes Sharyon Cable, CNM  escitalopram (LEXAPRO) 5 MG tablet Take 1 tablet (5 mg total) by mouth daily. 01/15/20  Yes Arfeen, Phillips Grout, MD  ibuprofen (ADVIL) 200 MG tablet Take 600 mg by mouth every 6 (six) hours as needed for moderate pain.   Yes  [provider]  lamoTRIgine (LAMICTAL) 200 MG tablet Take 1 tablet (200 mg total) by mouth daily. 01/15/20  Yes Arfeen, Phillips Grout, MD  QUEtiapine (SEROQUEL) 200 MG tablet Take 1 tablet (200 mg total) by mouth at bedtime. 01/15/20  Yes Arfeen, Phillips Grout, MD  QUEtiapine (SEROQUEL) 25 MG tablet Take one tab daily and 2nd if needed Patient taking differently: Take 25 mg by mouth daily. 2nd if needed 01/15/20  Yes Arfeen, Phillips Grout, MD  simethicone (GAS-X) 80 MG chewable tablet Chew 1 tablet (80 mg total) by mouth every 6 (six) hours as needed for flatulence. 02/26/20  Yes Sharyon Cable, CNM  cyclobenzaprine (FLEXERIL) 5 MG tablet Take 1 tablet (5 mg total) by mouth 3 (three) times daily. Patient not taking: Reported on 10/14/2019 12/18/18   Aldean Baker, NP  hydrOXYzine (VISTARIL) 25 MG capsule Take 1 capsule (25 mg total) by mouth daily as needed for anxiety. Patient not taking: Reported on 01/15/2020 07/16/19   Cleotis Nipper, MD    Physical Exam: Vitals:   02/26/20 2215 02/26/20 2230 02/26/20 2245 02/26/20 2300  BP: 112/80 120/83 123/86 124/88  Pulse: 97 96 99 (!) 102  Resp: 14 12 19 17   Temp:      TempSrc:      SpO2: 91% 91% 97% 90%  Weight:      Height:        Constitutional: NAD, calm, comfortable Eyes: PERRL, lids and conjunctivae normal ENMT: Mucous membranes are moist. Posterior pharynx clear of any exudate or lesions.Normal dentition.  Neck: normal, supple, no masses, no thyromegaly Respiratory: clear to auscultation bilaterally, no wheezing, no crackles. Normal respiratory effort. No accessory muscle use.  Cardiovascular: Regular rate and rhythm, no murmurs / rubs / gallops. No extremity edema. 2+ pedal pulses. No carotid bruits.  Abdomen: Tender in right upper quadrant, no masses palpated. No hepatosplenomegaly. Bowel sounds positive.  Musculoskeletal: no clubbing / cyanosis. No joint deformity upper and lower extremities. Good ROM, no contractures. Normal muscle tone.  Skin:  no rashes, lesions, ulcers. No induration Neurologic: CN 2-12 grossly intact. Sensation intact, DTR normal. Strength 5/5 in all 4.  Psychiatric: Normal judgment and insight. Alert and oriented x 3. Normal mood.    Labs on Admission: I have personally reviewed following labs and imaging studies  CBC: Recent Labs  Lab 02/25/20 1351 02/26/20 1829  WBC 6.0 12.9*  NEUTROABS  --  10.8*  HGB 12.6 12.8  HCT 37.3 40.4  MCV 82.5 87.6  PLT 276 251   Basic Metabolic Panel: Recent Labs  Lab 02/25/20 1351 02/26/20 1829  NA 138 147*  K 3.7 3.7  CL 107 108  CO2 22 23  GLUCOSE 95 117*  BUN 9 14  CREATININE 0.62 1.53*  CALCIUM 8.8* 8.3*   GFR: Estimated Creatinine Clearance: 52.8 mL/min (A) (by C-G formula based on SCr of 1.53 mg/dL (H)). Liver Function Tests: Recent Labs  Lab 02/25/20 1351 02/26/20 1829  AST 25 702*  ALT 30 559*  ALKPHOS 78 73  BILITOT  0.3 0.4  PROT 7.3 7.0  ALBUMIN 4.4 4.3   Recent Labs  Lab 02/25/20 1351 02/26/20 1829  LIPASE 45 371*   No results for input(s): AMMONIA in the last 168 hours. Coagulation Profile: No results for input(s): INR, PROTIME in the last 168 hours. Cardiac Enzymes: No results for input(s): CKTOTAL, CKMB, CKMBINDEX, TROPONINI in the last 168 hours. BNP (last 3 results) No results for input(s): PROBNP in the last 8760 hours. HbA1C: No results for input(s): HGBA1C in the last 72 hours. CBG: No results for input(s): GLUCAP in the last 168 hours. Lipid Profile: No results for input(s): CHOL, HDL, LDLCALC, TRIG, CHOLHDL, LDLDIRECT in the last 72 hours. Thyroid Function Tests: No results for input(s): TSH, T4TOTAL, FREET4, T3FREE, THYROIDAB in the last 72 hours. Anemia Panel: No results for input(s): VITAMINB12, FOLATE, FERRITIN, TIBC, IRON, RETICCTPCT in the last 72 hours. Urine analysis:    Component Value Date/Time   COLORURINE COLORLESS (A) 02/25/2020 1357   APPEARANCEUR CLEAR 02/25/2020 1357   LABSPEC 1.003 (L)  02/25/2020 1357   PHURINE 6.0 02/25/2020 1357   GLUCOSEU NEGATIVE 02/25/2020 1357   HGBUR NEGATIVE 02/25/2020 1357   BILIRUBINUR NEGATIVE 02/25/2020 1357   KETONESUR NEGATIVE 02/25/2020 1357   PROTEINUR NEGATIVE 02/25/2020 1357   UROBILINOGEN 0.2 09/12/2008 1641   NITRITE NEGATIVE 02/25/2020 1357   LEUKOCYTESUR NEGATIVE 02/25/2020 1357    Radiological Exams on Admission: US Transvaginal Non-OB  Result Date: 02/26/2020 CLINICAL DATA:  Right lower quadrant pain for 3 days EXAM: TRANSABDOMINAL AND TRANSVAGINAL ULTRASOUND OF PELVIS TECHNIQUE: Both transabdominal and transvaginal ultrasound examinations of the pelvis were performed. Transabdominal technique was performed for global imaging of the pelvis including uterus, ovaries, adnexal regions, and pelvic cul-de-sac. It was necessary to proceed with endovaginal exam following the transabdominal exam to visualize the adnexal structures. COMPARISON:  02/25/2020 CT, 02/26/2020 ultrasound FINDINGS: Uterus Measurements: 7.3 x 3.0 x 3.7 cm = volume: 42 mL. No fibroids or other mass visualized. Endometrium Thickness: 2 mm.  No focal abnormality visualized. Right ovary Not identified due to bowel gas. Left ovary Not identified due to bowel gas. Other findings No abnormal free fluid. IMPRESSION: 1. Unremarkable appearance of the uterus. 2. Nonvisualization of the adnexal structures due to bowel gas. Electronically Signed   By: Sharlet Salina M.D.   On: 02/26/2020 19:20   US Pelvis Complete  Result Date: 02/26/2020 CLINICAL DATA:  Right lower quadrant pain for 3 days EXAM: TRANSABDOMINAL AND TRANSVAGINAL ULTRASOUND OF PELVIS TECHNIQUE: Both transabdominal and transvaginal ultrasound examinations of the pelvis were performed. Transabdominal technique was performed for global imaging of the pelvis including uterus, ovaries, adnexal regions, and pelvic cul-de-sac. It was necessary to proceed with endovaginal exam following the transabdominal exam to visualize the  adnexal structures. COMPARISON:  02/25/2020 CT, 02/26/2020 ultrasound FINDINGS: Uterus Measurements: 7.3 x 3.0 x 3.7 cm = volume: 42 mL. No fibroids or other mass visualized. Endometrium Thickness: 2 mm.  No focal abnormality visualized. Right ovary Not identified due to bowel gas. Left ovary Not identified due to bowel gas. Other findings No abnormal free fluid. IMPRESSION: 1. Unremarkable appearance of the uterus. 2. Nonvisualization of the adnexal structures due to bowel gas. Electronically Signed   By: Sharlet Salina M.D.   On: 02/26/2020 19:20   CT Abdomen Pelvis W Contrast  Result Date: 02/26/2020 CLINICAL DATA:  Right lower quadrant pain worsening since yesterday EXAM: CT ABDOMEN AND PELVIS WITH CONTRAST TECHNIQUE: Multidetector CT imaging of the abdomen and pelvis was performed using  the standard protocol following bolus administration of intravenous contrast. CONTRAST:  75mL OMNIPAQUE IOHEXOL 300 MG/ML  SOLN COMPARISON:  02/25/2020 FINDINGS: Lower chest: Mild dependent changes in the lung bases. Hepatobiliary: Diffuse fatty infiltration of the liver. No focal liver lesions. Portal veins are patent. Gallbladder is surgically absent. No bile duct dilatation. Pancreas: Unremarkable. No pancreatic ductal dilatation or surrounding inflammatory changes. Spleen: Normal in size without focal abnormality. Adrenals/Urinary Tract: No adrenal gland nodules. Kidneys are symmetrical. Nephrograms are homogeneous and symmetrical. No hydronephrosis or hydroureter. Bladder is filled with contrast material but is otherwise unremarkable. Stomach/Bowel: Stomach, small bowel, and colon are not abnormally distended. No wall thickening or inflammatory changes are appreciated. The appendix is well demonstrated and is normal. Vascular/Lymphatic: No significant vascular findings are present. No enlarged abdominal or pelvic lymph nodes. Reproductive: Uterus and ovaries are not enlarged. Small amount of free fluid in the right  pelvis is likely physiologic. Surgical clips in the low pelvis. Other: No free air in the abdomen. Abdominal wall musculature appears intact. Musculoskeletal: No acute or significant osseous findings. IMPRESSION: 1. No acute process demonstrated in the abdomen or pelvis. No evidence of bowel obstruction or inflammation. Appendix is normal. 2. Diffuse fatty infiltration of the liver. 3. Small amount of free fluid in the right pelvis is likely physiologic. Electronically Signed   By: Burman Nieves M.D.   On: 02/26/2020 19:45   CT ABDOMEN PELVIS W CONTRAST  Result Date: 02/25/2020 CLINICAL DATA:  Right lower quadrant abdominal pain suspecting appendicitis. Symptoms since Tuesday. EXAM: CT ABDOMEN AND PELVIS WITH CONTRAST TECHNIQUE: Multidetector CT imaging of the abdomen and pelvis was performed using the standard protocol following bolus administration of intravenous contrast. CONTRAST:  OMNIPAQUE IOHEXOL 300 MG/ML  SOLN COMPARISON:  None. FINDINGS: Lower chest: The lung bases are clear. Small esophageal hiatal hernia. Hepatobiliary: No focal liver abnormality is seen. Status post cholecystectomy. No biliary dilatation. Pancreas: Unremarkable. No pancreatic ductal dilatation or surrounding inflammatory changes. Spleen: Normal in size without focal abnormality. Adrenals/Urinary Tract: Adrenal glands are unremarkable. Kidneys are normal, without renal calculi, focal lesion, or hydronephrosis. Bladder is unremarkable. Stomach/Bowel: The stomach, small bowel, and colon are not abnormally distended. No wall thickening or inflammatory changes. The appendix is normal. Few scattered colonic diverticula without evidence of diverticulitis. Surgical clips adjacent to the rectum, possibly displaced tubal ligation clips. Vascular/Lymphatic: No significant vascular findings are present. No enlarged abdominal or pelvic lymph nodes. Reproductive: Uterus and bilateral adnexa are unremarkable. Other: No free air or free  fluid in the abdomen. Abdominal wall musculature appears intact. Musculoskeletal: No acute or significant osseous findings. IMPRESSION: 1. No acute process demonstrated in the abdomen or pelvis. No evidence of bowel obstruction or inflammation. Appendix is normal. 2. Small esophageal hiatal hernia. Electronically Signed   By: Burman Nieves M.D.   On: 02/25/2020 22:28   US PELVIC DOPPLER (TORSION R/O OR MASS ARTERIAL FLOW)  Result Date: 02/26/2020 CLINICAL DATA:  Initial evaluation for acute right lower quadrant pain. EXAM: TRANSABDOMINAL AND TRANSVAGINAL ULTRASOUND OF PELVIS DOPPLER ULTRASOUND OF OVARIES TECHNIQUE: Both transabdominal and transvaginal ultrasound examinations of the pelvis were performed. Transabdominal technique was performed for global imaging of the pelvis including uterus, ovaries, adnexal regions, and pelvic cul-de-sac. It was necessary to proceed with endovaginal exam following the transabdominal exam to visualize the uterus, endometrium, and ovaries. Color and duplex Doppler ultrasound was utilized to evaluate blood flow to the ovaries. COMPARISON:  Prior CT from 02/25/2020. FINDINGS: Uterus Measurements: 7.0 x 3.1  x 4.1 cm = volume: 47.4 mL. Uterus is anteverted. No fibroids or other mass visualized. Endometrium Thickness: 3.8 mm.  No focal abnormality visualized. Right ovary Evaluation of the right adnexa technically limited by shadowing from overlying bowel gas. The right ovary is not confidently identified. The ultrasound technologist has made note of a 2.6 x 1.2 x 2.1 cm hypoechoic structure in the right adnexa (image 51), of uncertain etiology, but could reflect a small portion of bowel. No blood flow is seen within this structure. Certainly, no significant inflammatory changes seen within the right adnexa as would be expected in the setting of ovarian torsion. Left ovary Not visualized.  No adnexal mass. Neither ovary confidently identified, limiting assessment for possible  torsion. Other findings No abnormal free fluid. IMPRESSION: 1. Technically limited exam due to shadowing from overlying bowel gas. 2. The right ovary is not confidently identified. 2.6 cm hypoechoic structure within the right adnexa as discussed above, nonspecific, but could reflect a small portion of bowel. No convincing evidence for ovarian torsion identified. If there remains high clinical suspicion for possible occult torsion or other pathology, a short interval follow-up ultrasound may be helpful for further evaluation and to evaluate for interval changes. 3. Nonvisualization of the left ovary. No other adnexal mass or free fluid. 4. Normal sonographic appearance of the uterus and endometrium. No other acute abnormality identified. Electronically Signed   By: Rise Mu M.D.   On: 02/26/2020 01:50   US PELVIC COMPLETE WITH TRANSVAGINAL  Result Date: 02/26/2020 CLINICAL DATA:  Initial evaluation for acute right lower quadrant pain. EXAM: TRANSABDOMINAL AND TRANSVAGINAL ULTRASOUND OF PELVIS DOPPLER ULTRASOUND OF OVARIES TECHNIQUE: Both transabdominal and transvaginal ultrasound examinations of the pelvis were performed. Transabdominal technique was performed for global imaging of the pelvis including uterus, ovaries, adnexal regions, and pelvic cul-de-sac. It was necessary to proceed with endovaginal exam following the transabdominal exam to visualize the uterus, endometrium, and ovaries. Color and duplex Doppler ultrasound was utilized to evaluate blood flow to the ovaries. COMPARISON:  Prior CT from 02/25/2020. FINDINGS: Uterus Measurements: 7.0 x 3.1 x 4.1 cm = volume: 47.4 mL. Uterus is anteverted. No fibroids or other mass visualized. Endometrium Thickness: 3.8 mm.  No focal abnormality visualized. Right ovary Evaluation of the right adnexa technically limited by shadowing from overlying bowel gas. The right ovary is not confidently identified. The ultrasound technologist has made note of a 2.6  x 1.2 x 2.1 cm hypoechoic structure in the right adnexa (image 51), of uncertain etiology, but could reflect a small portion of bowel. No blood flow is seen within this structure. Certainly, no significant inflammatory changes seen within the right adnexa as would be expected in the setting of ovarian torsion. Left ovary Not visualized.  No adnexal mass. Neither ovary confidently identified, limiting assessment for possible torsion. Other findings No abnormal free fluid. IMPRESSION: 1. Technically limited exam due to shadowing from overlying bowel gas. 2. The right ovary is not confidently identified. 2.6 cm hypoechoic structure within the right adnexa as discussed above, nonspecific, but could reflect a small portion of bowel. No convincing evidence for ovarian torsion identified. If there remains high clinical suspicion for possible occult torsion or other pathology, a short interval follow-up ultrasound may be helpful for further evaluation and to evaluate for interval changes. 3. Nonvisualization of the left ovary. No other adnexal mass or free fluid. 4. Normal sonographic appearance of the uterus and endometrium. No other acute abnormality identified. Electronically Signed   By: Sharlet Salina  Phill MyronMcClintock M.D.   On: 02/26/2020 01:50    EKG: Independently reviewed.  Sinus tachycardia  Assessment/Plan Active Problems:   Moderate bipolar I disorder, most recent episode depressed (HCC)   Pancreatitis   AKI (acute kidney injury) (HCC)   Lactic acidosis   Abdominal pain   Elevated liver enzymes     1. Acute pancreatitis.  Patient presents with abdominal pain, nausea and has elevated LFTs and lipase.  CT abdomen does not show any radiographic findings consistent with pancreatitis.  She is tender in her abdomen.  We will keep the patient n.p.o.  Start on IV fluids.  She is already status post cholecystectomy.  She denies any alcohol use.  She has not been started on any new medications.  Continue to treat  supportively for now. 2. Elevated liver enzymes possibly related to pancreatitis.  She is status post cholecystectomy.  Will check right upper quadrant ultrasound to evaluate for any underlying stones. 3. Lactic acidosis.  Likely related to dehydration and volume depletion.  Lactate is trending down with IV fluids. 4. Acute kidney injury.  Secondary to hypotension.  Continue IV fluids and monitor.  No evidence of hydronephrosis on imaging. 5. Bipolar 1 disorder.  Holding mood stabilizers for now while NPO.  Can hopefully resume in the next 24 hours as diet is advanced. 6. Abdominal pain.  Secondary to pancreatitis.  Treat supportively.  Case reviewed with Dr. Emelda FearFerguson on-call for GYN and it was felt that possibility of ovarian torsion has been adequately assessed and no further work-up with no surgery at this time.  It was felt to be unlikely that her symptoms were related to any GYN cause.  DVT prophylaxis: Lovenox Code Status: Full code Family Communication: Discussed with patient Disposition Plan: Discharge home once abdominal pain has improved Consults called:   Admission status: Inpatient, MedSurg  Severity of Illness: The appropriate patient status for this patient is INPATIENT. Inpatient status is judged to be reasonable and necessary in order to provide the required intensity of service to ensure the patient's safety. The patient's presenting symptoms, physical exam findings, and initial radiographic and laboratory data in the context of their chronic comorbidities is felt to place them at high risk for further clinical deterioration. Furthermore, it is not anticipated that the patient will be medically stable for discharge from the hospital within 2 midnights of admission. The following factors support the patient status of inpatient.    "           The patient's presenting symptoms include  severe abdominal pain, nausea "           The worrisome physical exam findings include   hypotension, tachycardia "           The initial radiographic and laboratory data are worrisome because of  elevated LFTs, elevated lipase, renal failure "           The chronic co-morbidities include  bipolar disorder     * I certify that at the point of admission it is my clinical judgment that the patient will require inpatient hospital care spanning beyond 2 midnights from the point of admission due to high intensity of service, high risk for further deterioration and high frequency of surveillance required.Erick Blinks*   Stefanie Bergquist MD [facility]   If 7PM-7AM, please contact night-coverage [email or web address]   02/26/2020, 11:56 PM

## 2020-02-26 NOTE — MAU Note (Signed)
Pt came by [facility] from [facility] to Rm # [id number]. Alert and oriented. Has NS R AC clean and dry. Alittle positional. Having constant pain RLQ like constant menstrual pain. No VB

## 2020-02-27 ENCOUNTER — Inpatient Hospital Stay (HOSPITAL_COMMUNITY): Payer: 59

## 2020-02-27 DIAGNOSIS — R1013 Epigastric pain: Secondary | ICD-10-CM

## 2020-02-27 LAB — CBC
HCT: 35.5 % — ABNORMAL LOW (ref 36.0–46.0)
HCT: 35.1 % — ABNORMAL LOW (ref 36.0–46.0)
Hemoglobin: 11.8 g/dL — ABNORMAL LOW (ref 12.0–15.0)
Hemoglobin: 11.3 g/dL — ABNORMAL LOW (ref 12.0–15.0)
MCH: 28.4 pg (ref 26.0–34.0)
MCH: 27.6 pg (ref 26.0–34.0)
MCHC: 33.2 g/dL (ref 30.0–36.0)
MCHC: 32.2 g/dL (ref 30.0–36.0)
MCV: 85.3 fL (ref 80.0–100.0)
MCV: 85.6 fL (ref 80.0–100.0)
Platelets: 196 10*3/uL (ref 150–400)
Platelets: 194 10*3/uL (ref 150–400)
RBC: 4.16 MIL/uL (ref 3.87–5.11)
RBC: 4.1 MIL/uL (ref 3.87–5.11)
RDW: 13.8 % (ref 11.5–15.5)
RDW: 13.9 % (ref 11.5–15.5)
WBC: 9.2 10*3/uL (ref 4.0–10.5)
WBC: 9.6 10*3/uL (ref 4.0–10.5)
nRBC: 0 % (ref 0.0–0.2)
nRBC: 0 % (ref 0.0–0.2)

## 2020-02-27 LAB — COMPREHENSIVE METABOLIC PANEL
ALT: 395 U/L — ABNORMAL HIGH (ref 0–44)
AST: 436 U/L — ABNORMAL HIGH (ref 15–41)
Albumin: 3.7 g/dL (ref 3.5–5.0)
Alkaline Phosphatase: 73 U/L (ref 38–126)
Anion gap: 11 (ref 5–15)
BUN: 14 mg/dL (ref 6–20)
CO2: 22 mmol/L (ref 22–32)
Calcium: 7.8 mg/dL — ABNORMAL LOW (ref 8.9–10.3)
Chloride: 106 mmol/L (ref 98–111)
Creatinine, Ser: 0.83 mg/dL (ref 0.44–1.00)
GFR calc Af Amer: >60 mL/min (ref >60)
GFR calc non Af Amer: >60 mL/min (ref >60)
Glucose, Bld: 108 mg/dL — ABNORMAL HIGH (ref 70–99)
Potassium: 4.3 mmol/L (ref 3.5–5.1)
Sodium: 139 mmol/L (ref 135–145)
Total Bilirubin: 0.6 mg/dL (ref 0.3–1.2)
Total Protein: 6.7 g/dL (ref 6.5–8.1)

## 2020-02-27 LAB — CREATININE, SERUM
Creatinine, Ser: 1.23 mg/dL — ABNORMAL HIGH (ref 0.44–1.00)
GFR calc Af Amer: 58 mL/min — ABNORMAL LOW (ref >60)
GFR calc non Af Amer: 50 mL/min — ABNORMAL LOW (ref >60)

## 2020-02-27 LAB — LIPASE, BLOOD: Lipase: 1241 U/L — ABNORMAL HIGH (ref 11–51)

## 2020-02-27 LAB — LACTIC ACID, PLASMA
Lactic Acid, Venous: 2.1 mmol/L (ref 0.5–1.9)
Lactic Acid, Venous: 1.2 mmol/L (ref 0.5–1.9)

## 2020-02-27 LAB — HIV ANTIBODY (ROUTINE TESTING W REFLEX): HIV Screen 4th Generation wRfx: NONREACTIVE

## 2020-02-27 LAB — PROTIME-INR
INR: 1.1 (ref 0.8–1.2)
Prothrombin Time: 14.2 seconds (ref 11.4–15.2)

## 2020-02-27 NOTE — ED Notes (Addendum)
Patient called out stating she wanted to leave, MD aware. Ride called.

## 2020-02-27 NOTE — ED Notes (Signed)
Complains of a headache and abdominal pain unrelieved by dilaudid. Given PO Tylenol.

## 2020-02-27 NOTE — Discharge Summary (Signed)
   PT LEFT AMA SUMMARY  Stefanie Galvan MRN - [id number] DOB - [birth year]  Date of Admission - 02/26/2020 Date LEFT AMA: 02/27/2020  Attending Physician:  Lonia Blood  Patient's PCP:  Alain Honey, MD  Disposition: LEFT AMA  Follow-up Appts:  Not able to be arranged or discussed as pt LEFT AMA  Diagnoses at time pt LEFT AMA: Acute pancreatitis Transaminitis Lactic acidosis Acute kidney injury Bipolar disorder  Initial presentation: 54 year old with a history of bipolar disorder who presented to the ED with a 3-day history of severe abdominal pain associated with nausea and vomiting.  She had been seen for this the day before in the emergency room and was able to be sent home.  CT abdomen/pelvis at the time of her visit 9/09 was without acute findings.  She was transferred to [facility] due to concerns for potential ovarian torsion but there was no evidence of this on pelvic ultrasound.  She returned to the ED 12 hours later with severe recurrence of her abdominal pain.  She states it is diffuse but also accompanied by tenderness in the right upper quadrant.  She reports minimal intake for the past several days.  In the ED she was found to be hypotensive.  LFTs and lipase were all elevated.  Repeat CT abdomen and pelvis was accomplished but again there were no acute findings. Pelvic ultrasound was also repeated and was unrevealing again.  Hospital Course: Listed below are the active problems present, and the status of the care of these problems, at the time the pt decided to LEAVE AMA:  Acute pancreatitis No remarkable findings on CT abdomen -status post cholecystectomy -ultrasound with no evidence of choledocholithiasis - denies alcohol use  Transaminitis History of cholecystectomy -R [place] without acute findings - perhaps this patient actually did form a ductal stone which was passed  Lactic acidosis Likely simply representative of significant volume depletion due to very  poor intake  Acute kidney injury Prerenal in nature due to severe dehydration - no evidence of hydronephrosis on multiple imaging episodes of abdomen  Bipolar disorder Resume usual oral medication    Medication List    Unable to be finalized as pt LEFT AMA  Day of Discharge Wt Readings from Last 3 Encounters:  02/26/20 104.3 kg  02/25/20 81.6 kg  08/26/18 102.1 kg   Temp Readings from Last 3 Encounters:  02/26/20 97.6 F (36.4 C) (Oral)  02/26/20 97.6 F (36.4 C)  08/26/18 98.7 F (37.1 C) (Oral)   BP Readings from Last 3 Encounters:  02/27/20 (!) 141/80  02/26/20 (!) 142/67  08/26/18 127/75   Pulse Readings from Last 3 Encounters:  02/27/20 88  02/26/20 77  08/26/18 61    Physical Exam: Exam not able to be completed at time of d/c as pt LEFT AMA  7:01 PM 02/27/20  Lonia Blood, MD [facility] Office  [phone]

## 2020-02-27 NOTE — ED Notes (Signed)
Pt 02 dropped to low 80's while asleep, increased [facility] O2 flow rate to 4L

## 2020-04-06 ENCOUNTER — Telehealth (INDEPENDENT_AMBULATORY_CARE_PROVIDER_SITE_OTHER): Payer: Self-pay | Admitting: Psychiatry

## 2020-04-06 ENCOUNTER — Encounter (HOSPITAL_COMMUNITY): Payer: Self-pay | Admitting: Psychiatry

## 2020-04-06 ENCOUNTER — Other Ambulatory Visit: Payer: Self-pay

## 2020-04-06 DIAGNOSIS — F319 Bipolar disorder, unspecified: Secondary | ICD-10-CM

## 2020-04-06 DIAGNOSIS — F4310 Post-traumatic stress disorder, unspecified: Secondary | ICD-10-CM

## 2020-04-06 MED ORDER — QUETIAPINE FUMARATE 25 MG PO TABS: 25.0000 mg | ORAL_TABLET | Freq: Every day | ORAL | 0 refills | Status: DC

## 2020-04-06 MED ORDER — QUETIAPINE FUMARATE 200 MG PO TABS: 200.0000 mg | ORAL_TABLET | Freq: Every day | ORAL | 2 refills | Status: DC

## 2020-04-06 MED ORDER — ESCITALOPRAM OXALATE 5 MG PO TABS: 5.0000 mg | ORAL_TABLET | Freq: Every day | ORAL | 2 refills | Status: DC

## 2020-04-06 MED ORDER — LAMOTRIGINE 200 MG PO TABS: 200.0000 mg | ORAL_TABLET | Freq: Every day | ORAL | 2 refills | Status: DC

## 2020-04-06 NOTE — Progress Notes (Signed)
Virtual Visit via Telephone Note  I connected with Stefanie Galvan on 04/06/20 at  8:20 AM EDT by telephone and verified that I am speaking with the correct person using two identifiers.  Location: Patient: home Provider: home office   I discussed the limitations, risks, security and privacy concerns of performing an evaluation and management service by telephone and the availability of in person appointments. I also discussed with the patient that there may be a patient responsible charge related to this service. The patient expressed understanding and agreed to proceed.   History of Present Illness: Patient is evaluated by phone session.  She is taking Seroquel 25 mg in the morning every day and she feels that helps calm her down.  She is sleeping better.  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  Patient told they were not giving adequate training and she was not happy.  She is able to get a job to work from home for [employer].  She is happy because her job is to schedule appointments and she can work while at home.  She feels her mood swings irritability is better since taking the Seroquel 25 mg every day.  She is not happy because her mother moved in next door but she is now trying to set limits with her.  She recently watched a movie about the bipolar disorder with her mother had a bipolar disorder and her kids affected with the illness.  She realized that how important it is to take the medicine someone who has bipolar disorder.  She is more focused on her general health.  She lost 14 pounds as she is more focused on her diet.  In September she was admitted in the hospital for acute abdominal pain and her lipase was very high.  She was diagnosed with acute pancreatitis and she stayed in the hospital for 3 days but she was not happy and left AMA.  She went to her primary care physician at [facility] and seen by PA O`Bauch who did blood work and she was told everything is normal.  She is feeling good.  She has no more nausea and vomiting.  But she is scared and trying to cut down the weight.  Sometimes she has nightmares but otherwise she is sleeping good.  She denies any highs and lows, anger, mania but still gets sometimes emotional.  She is not taking hydroxyzine but tried to take the Seroquel 25 mg every morning.  She has no rash, itching tremors or shakes.  She denies drinking or using any illegal substances.   Past Psychiatric History:Reviewed H/Obipolar disorder,PTSDansmultipleinpatient.[facility] inJuly [facility] at [facility] and than IOP.H/Ooverdose on lithium, trazodone,LexaproandMinipress. H/Opoor impulse controlwith excessive talkingand excessive spending.[facility].TookSeroquel, Zoloft, Cymbalta, Paxil, Prozac, Ativan, Abilify, Wellbutrin, Minipress and lithium.  Recent Results (from the past 2160 hour(s))  Lipase, blood     Status: None   Collection Time: 02/25/20  1:51 PM  Result Value Ref Range   Lipase 45 11 - 51 U/L    Comment: Performed at [facility], [address] [place], [place] [address]  Comprehensive metabolic panel     Status: Abnormal   Collection Time: 02/25/20  1:51 PM  Result Value Ref Range   Sodium 138 135 - 145 mmol/L   Potassium 3.7 3.5 - 5.1 mmol/L   Chloride 107 98 - 111 mmol/L  CO2 22 22 - 32 mmol/L   Glucose, Bld 95 70 - 99 mg/dL    Comment: Glucose reference range applies only to samples taken after fasting for at least 8 hours.   BUN 9 6 - 20 mg/dL   Creatinine, Ser 9.60 0.44 - 1.00 mg/dL   Calcium 8.8 (L) 8.9 - 10.3 mg/dL   Total Protein 7.3 6.5 - 8.1 g/dL   Albumin 4.4 3.5 - 5.0 g/dL   AST 25 15 - 41 U/L   ALT 30 0 - 44 U/L   Alkaline Phosphatase 78 38 - 126 U/L   Total Bilirubin 0.3 0.3 - 1.2 mg/dL   GFR calc non Af Amer >60 >60 mL/min   GFR  calc Af Amer >60 >60 mL/min   Anion gap 9 5 - 15    Comment: Performed at [facility], [address] [place], [place] [address]  CBC     Status: None   Collection Time: 02/25/20  1:51 PM  Result Value Ref Range   WBC 6.0 4.0 - 10.5 K/uL   RBC 4.52 3.87 - 5.11 MIL/uL   Hemoglobin 12.6 12.0 - 15.0 g/dL   HCT 81.1 36 - 46 %   MCV 82.5 80.0 - 100.0 fL   MCH 27.9 26.0 - 34.0 pg   MCHC 33.8 30.0 - 36.0 g/dL   RDW 91.4 78.2 - 95.6 %   Platelets 276 150 - 400 K/uL   nRBC 0.0 0.0 - 0.2 %    Comment: Performed at [facility], [address] [place], [place] [address]  Urinalysis, Routine w reflex microscopic     Status: Abnormal   Collection Time: 02/25/20  1:57 PM  Result Value Ref Range   Color, Urine COLORLESS (A) YELLOW   APPearance CLEAR CLEAR   Specific Gravity, Urine 1.003 (L) 1.005 - 1.030   pH 6.0 5.0 - 8.0   Glucose, UA NEGATIVE NEGATIVE mg/dL   Hgb urine dipstick NEGATIVE NEGATIVE   Bilirubin Urine NEGATIVE NEGATIVE   Ketones, ur NEGATIVE NEGATIVE mg/dL   Protein, ur NEGATIVE NEGATIVE mg/dL   Nitrite NEGATIVE NEGATIVE   Leukocytes,Ua NEGATIVE NEGATIVE    Comment: Performed at [facility], [address], [place], [place] [address]  I-Stat beta hCG blood, ED     Status: None   Collection Time: 02/25/20  1:58 PM  Result Value Ref Range   I-stat hCG, quantitative <5.0 <5 mIU/mL   Comment 3            Comment:   GEST. AGE      CONC.  (mIU/mL)   <=1 WEEK        5 - 50     2 WEEKS       50 - 500     3 WEEKS       100 - 10,000     4 WEEKS     1,000 - 30,000        FEMALE AND NON-PREGNANT FEMALE:     LESS THAN 5 mIU/mL   Lactic acid, plasma     Status: Abnormal   Collection Time: 02/26/20  6:29 PM  Result Value Ref Range   Lactic Acid, Venous 4.6 (HH) 0.5 - 1.9 mmol/L    Comment: CRITICAL RESULT CALLED TO, READ BACK BY AND VERIFIED WITH: Norville Haggard RN 1916 02/26/20 JM Performed at [facility],  [address], [place], [place] [address]   Comprehensive metabolic panel  Status: Abnormal   Collection Time: 02/26/20  6:29 PM  Result Value Ref Range   Sodium 147 (H) 135 - 145 mmol/L    Comment: DELTA CHECK NOTED   Potassium 3.7 3.5 - 5.1 mmol/L   Chloride 108 98 - 111 mmol/L   CO2 23 22 - 32 mmol/L   Glucose, Bld 117 (H) 70 - 99 mg/dL    Comment: Glucose reference range applies only to samples taken after fasting for at least 8 hours.   BUN 14 6 - 20 mg/dL   Creatinine, Ser 0.981.53 (H) 0.44 - 1.00 mg/dL   Calcium 8.3 (L) 8.9 - 10.3 mg/dL   Total Protein 7.0 6.5 - 8.1 g/dL   Albumin 4.3 3.5 - 5.0 g/dL   AST 119702 (H) 15 - 41 U/L   ALT 559 (H) 0 - 44 U/L   Alkaline Phosphatase 73 38 - 126 U/L   Total Bilirubin 0.4 0.3 - 1.2 mg/dL   GFR calc non Af Amer 38 (L) >60 mL/min   GFR calc Af Amer 45 (L) >60 mL/min   Anion gap 16 (H) 5 - 15    Comment: Performed at [facility], [address] [place], [place] [address]  Lipase, blood     Status: Abnormal   Collection Time: 02/26/20  6:29 PM  Result Value Ref Range   Lipase 371 (H) 11 - 51 U/L    Comment: Performed at [facility], [address] [place], [place] [address]  CBC with Differential     Status: Abnormal   Collection Time: 02/26/20  6:29 PM  Result Value Ref Range   WBC 12.9 (H) 4.0 - 10.5 K/uL   RBC 4.61 3.87 - 5.11 MIL/uL   Hemoglobin 12.8 12.0 - 15.0 g/dL   HCT 30.840.4 36 - 46 %   MCV 87.6 80.0 - 100.0 fL   MCH 27.8 26.0 - 34.0 pg   MCHC 31.7 30.0 - 36.0 g/dL   RDW 65.713.7 84.611.5 - 96.215.5 %   Platelets 251 150 - 400 K/uL   nRBC 0.0 0.0 - 0.2 %   Neutrophils Relative % 84 %   Neutro Abs 10.8 (H) 1.7 - 7.7 K/uL   Lymphocytes Relative 7 %   Lymphs Abs 0.9 0.7 - 4.0 K/uL   Monocytes Relative 8 %   Monocytes Absolute 1.0 0.1 - 1.0 K/uL   Eosinophils Relative 0 %   Eosinophils Absolute 0.0 0.0 - 0.5 K/uL   Basophils Relative 0 %   Basophils Absolute 0.0 0.0 - 0.1 K/uL   Immature  Granulocytes 1 %   Abs Immature Granulocytes 0.09 (H) 0.00 - 0.07 K/uL    Comment: Performed at [facility], [address] [place], [place] [address]  SARS Coronavirus 2 by RT PCR (hospital order, performed in [facility] hospital lab) Nasopharyngeal Nasopharyngeal Swab     Status: None   Collection Time: 02/26/20  6:29 PM   Specimen: Nasopharyngeal Swab  Result Value Ref Range   SARS Coronavirus 2 NEGATIVE NEGATIVE    Comment: (NOTE) SARS-CoV-2 target nucleic acids are NOT DETECTED.  The SARS-CoV-2 RNA is generally detectable in upper and lower respiratory specimens during the acute phase of infection. The lowest concentration of SARS-CoV-2 viral copies this assay can detect is 250 copies / mL. A negative result does not preclude SARS-CoV-2 infection and should not be used as the sole basis for treatment or other patient management decisions.  A negative result may  occur with improper specimen collection / handling, submission of specimen other than nasopharyngeal swab, presence of viral mutation(s) within the areas targeted by this assay, and inadequate number of viral copies (<250 copies / mL). A negative result must be combined with clinical observations, patient history, and epidemiological information.  Fact Sheet for Patients:   BoilerBrush.com.cy  Fact Sheet for Healthcare Providers: https://pope.com/  This test is not yet approved or  cleared by the [place] FDA and has been authorized for detection and/or diagnosis of SARS-CoV-2 by FDA under an Emergency Use Authorization (EUA).  This EUA will remain in effect (meaning this test can be used) for the duration of the COVID-19 declaration under Section 564(b)(1) of the Act, 21 U.S.C. section 360bbb-3(b)(1), unless the authorization is terminated or revoked sooner.  Performed at [facility], [address], [place], [place]  [address]   Lactic acid, plasma     Status: Abnormal   Collection Time: 02/26/20  9:13 PM  Result Value Ref Range   Lactic Acid, Venous 2.9 (HH) 0.5 - 1.9 mmol/L    Comment: CRITICAL VALUE NOTED.  VALUE IS CONSISTENT WITH PREVIOUSLY REPORTED AND CALLED VALUE. Performed at [facility], [address], [place], [place] [address]   HIV Antibody (routine testing w rflx)     Status: None   Collection Time: 02/27/20 12:27 AM  Result Value Ref Range   HIV Screen 4th Generation wRfx Non Reactive Non Reactive    Comment: Performed at [facility], [address] [place], [place] [address]  CBC     Status: Abnormal   Collection Time: 02/27/20 12:27 AM  Result Value Ref Range   WBC 9.2 4.0 - 10.5 K/uL   RBC 4.16 3.87 - 5.11 MIL/uL   Hemoglobin 11.8 (L) 12.0 - 15.0 g/dL   HCT 91.4 (L) 36 - 46 %   MCV 85.3 80.0 - 100.0 fL   MCH 28.4 26.0 - 34.0 pg   MCHC 33.2 30.0 - 36.0 g/dL   RDW 78.2 95.6 - 21.3 %   Platelets 196 150 - 400 K/uL   nRBC 0.0 0.0 - 0.2 %    Comment: Performed at [facility], [address] [place], [place] [address]  Creatinine, serum     Status: Abnormal   Collection Time: 02/27/20 12:27 AM  Result Value Ref Range   Creatinine, Ser 1.23 (H) 0.44 - 1.00 mg/dL   GFR calc non Af Amer 50 (L) >60 mL/min   GFR calc Af Amer 58 (L) >60 mL/min    Comment: Performed at [facility], [address], [place], [place] [address]  Lactic acid, plasma     Status: Abnormal   Collection Time: 02/27/20 12:27 AM  Result Value Ref Range   Lactic Acid, Venous 2.1 (HH) 0.5 - 1.9 mmol/L    Comment: CRITICAL VALUE NOTED.  VALUE IS CONSISTENT WITH PREVIOUSLY REPORTED AND CALLED VALUE. Performed at [facility], [address] [place], [place] [address]   CBC     Status: Abnormal   Collection Time: 02/27/20  7:00 AM  Result Value Ref Range   WBC 9.6 4.0 - 10.5 K/uL   RBC 4.10 3.87 - 5.11 MIL/uL   Hemoglobin  11.3 (L) 12.0 - 15.0 g/dL   HCT 41.3 (L) 36 - 46 %   MCV 85.6 80.0 - 100.0 fL   MCH 27.6 26.0 - 34.0 pg   MCHC 32.2 30.0 - 36.0  g/dL   RDW 40.3 47.4 - 25.9 %   Platelets 194 150 - 400 K/uL   nRBC 0.0 0.0 - 0.2 %    Comment: Performed at [facility], [address] [place], [place] [address]  Comprehensive metabolic panel     Status: Abnormal   Collection Time: 02/27/20  7:00 AM  Result Value Ref Range   Sodium 139 135 - 145 mmol/L    Comment: DELTA CHECK NOTED   Potassium 4.3 3.5 - 5.1 mmol/L   Chloride 106 98 - 111 mmol/L   CO2 22 22 - 32 mmol/L   Glucose, Bld 108 (H) 70 - 99 mg/dL    Comment: Glucose reference range applies only to samples taken after fasting for at least 8 hours.   BUN 14 6 - 20 mg/dL   Creatinine, Ser 5.64 0.44 - 1.00 mg/dL   Calcium 7.8 (L) 8.9 - 10.3 mg/dL   Total Protein 6.7 6.5 - 8.1 g/dL   Albumin 3.7 3.5 - 5.0 g/dL   AST 332 (H) 15 - 41 U/L   ALT 395 (H) 0 - 44 U/L   Alkaline Phosphatase 73 38 - 126 U/L   Total Bilirubin 0.6 0.3 - 1.2 mg/dL   GFR calc non Af Amer >60 >60 mL/min   GFR calc Af Amer >60 >60 mL/min   Anion gap 11 5 - 15    Comment: Performed at [facility], [address], [place], [place] [address]  Protime-INR     Status: None   Collection Time: 02/27/20  7:00 AM  Result Value Ref Range   Prothrombin Time 14.2 11.4 - 15.2 seconds   INR 1.1 0.8 - 1.2    Comment: (NOTE) INR goal varies based on device and disease states. Performed at [facility], [address] [place], [place] [address]   Lipase, blood     Status: Abnormal   Collection Time: 02/27/20  7:00 AM  Result Value Ref Range   Lipase 1,241 (H) 11 - 51 U/L    Comment: RESULTS CONFIRMED BY MANUAL DILUTION Performed at [facility], [address] [place], [place] [address]   Lactic acid, plasma     Status: None   Collection Time: 02/27/20  7:00 AM  Result Value Ref Range   Lactic Acid,  Venous 1.2 0.5 - 1.9 mmol/L    Comment: Performed at [facility], [address] [place], [place] [address]    Psychiatric Specialty Exam: Physical Exam  Review of Systems  Weight 219 lb (99.3 kg).There is no height or weight on file to calculate BMI.  General Appearance: NA  Eye Contact:  NA  Speech:  Clear and Coherent  Volume:  Normal  Mood:  Euthymic  Affect:  NA  Thought Process:  Descriptions of Associations: Intact  Orientation:  Full (Time, Place, and Person)  Thought Content:  Rumination  Suicidal Thoughts:  No  Homicidal Thoughts:  No  Memory:  Immediate;   Good Recent;   Good Remote;   Good  Judgement:  Fair  Insight:  Shallow  Psychomotor Activity:  NA  Concentration:  Concentration: Fair and Attention Span: Fair  Recall:  Good  Fund of Knowledge:  Good  Language:  Good  Akathisia:  No  Handed:  Right  AIMS (if indicated):     Assets:  Communication Skills Desire for Improvement Housing Resilience  ADL's:  Intact  Cognition:  WNL  Sleep:  7 hrs      Assessment and Plan: Bipolar disorder type I.  PTSD.  Patient again left her job and started a new job.  This is her third job in recent months.  Discussed her mood lability and also she left hospital AMA while she has symptoms of acute pancreatitis.  Explained that acute pancreatitis can be worst if not supervised in the hospital.  She acknowledged and apologized.  She was told by PA that her labs are normal.  I recommend to have her blood work results faxed to [place].  I reviewed her last labs which was very high LFT and lipase.  Acknowledged and congratulated about losing her weight.  Encouraged to keep the medication as prescribed.  Discussed limited boundaries with the mother and encouraged healthy lifestyle and watch her caloric intake.  Continue Lamictal 200 mg daily, Lexapro 5 mg daily, Seroquel 200 mg at bedtime and Seroquel 25 mg to take twice as needed.  Recommended to call us back if  she has any question or any concern.  Follow-up in 3 months.  Time spent 25 minutes.  More than 50% of the time spent in psychoeducation, counseling and coordination of care.  Follow Up Instructions:    I discussed the assessment and treatment plan with the patient. The patient was provided an opportunity to ask questions and all were answered. The patient agreed with the plan and demonstrated an understanding of the instructions.   The patient was advised to call back or seek an in-person evaluation if the symptoms worsen or if the condition fails to improve as anticipated.  I provided 25 minutes of non-face-to-face time during this encounter.   Cleotis Nipper, MD

## 2020-06-08 ENCOUNTER — Telehealth (HOSPITAL_COMMUNITY): Payer: Self-pay | Admitting: *Deleted

## 2020-06-08 NOTE — Telephone Encounter (Signed)
Patient called and had been to see her PCP who, after she heard the patients account of her behavior, suggested patients seroquel be increased to 200mg  BID.  Patient reported racing thoughts, anger, irrational thinking, crying, spending money and Insomnia.  She stated she can't stay at home and messes up her house just to clean it. She stated she has a short fuse and she is working hard to control herself. She has a safety plan in place and denies HI or SI.  Patient given crisis center number and address and she reported she would go if she felt she needed. Please review concerning Seroquel increase or other med changes.      Documentation

## 2020-06-08 NOTE — Telephone Encounter (Signed)
Patient called and had been to see her PCP who, after she heard the patients account of her behavior, suggested patients seroquel be increased to 200mg  BID.  Patient reported racing thoughts, anger, irrational thinking, crying, spending money and Insomnia.  She stated she can't stay at home and messes up her house just to clean it. She stated she has a short fuse and she is working hard to control herself. She has a safety plan in place and denies HI or SI.  Patient given crisis center number and address and she reported she would go if she felt she needed. Please review concerning Seroquel increase or other med changes.

## 2020-06-08 NOTE — Telephone Encounter (Signed)
If she already takes quetiapine 50 mg in AM, 200 mg at night, advise her to take quetiapine 100 mg in AM, and 200 mg at night. If she takes quetiapine 25 mg in AM, 200 mg at night, advise her to take quetiapine 50 mg in AM, and 200 mg at night. Side effects includes but not limited to drowsiness.  Let me know which tab she needs so that I can order to the pharmacy. I would also advise to have sooner appointment with Dr. Lolly Mustache if available.

## 2020-06-09 ENCOUNTER — Telehealth: Payer: Self-pay | Admitting: *Deleted

## 2020-06-09 NOTE — Telephone Encounter (Signed)
Placed phone call to patient.  Advised her to increase Seroquel to 100mg  q am and 200 mg PM.  She had already tried 100 am and said it worked well.  She reports she will stay at this dose as you advised.

## 2020-06-24 ENCOUNTER — Encounter (HOSPITAL_COMMUNITY): Payer: Self-pay | Admitting: Psychiatry

## 2020-06-24 ENCOUNTER — Telehealth (INDEPENDENT_AMBULATORY_CARE_PROVIDER_SITE_OTHER): Payer: Self-pay | Admitting: Psychiatry

## 2020-06-24 ENCOUNTER — Other Ambulatory Visit: Payer: Self-pay

## 2020-06-24 DIAGNOSIS — F4310 Post-traumatic stress disorder, unspecified: Secondary | ICD-10-CM

## 2020-06-24 DIAGNOSIS — F319 Bipolar disorder, unspecified: Secondary | ICD-10-CM

## 2020-06-24 MED ORDER — ESCITALOPRAM OXALATE 5 MG PO TABS: 5.0000 mg | ORAL_TABLET | Freq: Every day | ORAL | 2 refills | Status: DC

## 2020-06-24 MED ORDER — LAMOTRIGINE 200 MG PO TABS: 200.0000 mg | ORAL_TABLET | Freq: Every day | ORAL | 2 refills | Status: DC

## 2020-06-24 MED ORDER — QUETIAPINE FUMARATE 100 MG PO TABS: ORAL_TABLET | ORAL | 2 refills | Status: DC

## 2020-06-24 NOTE — Progress Notes (Signed)
Virtual Visit via Telephone Note  I connected with Stefanie Galvan on 06/24/20 at 11:20 AM EST by telephone and verified that I am speaking with the correct person using two identifiers.  Location: Patient: home Provider: home office   I discussed the limitations, risks, security and privacy concerns of performing an evaluation and management service by telephone and the availability of in person appointments. I also discussed with the patient that there may be a patient responsible charge related to this service. The patient expressed understanding and agreed to proceed.   History of Present Illness: Patient is evaluated by phone session.  She is on the phone by herself.  Recently she called our office and concerned about her mania and depression.  She told she was the next day because she was having manic symptoms and also depressive symptoms.  Her Seroquel dose were adjusted and now she is taking 100 mg in the morning and 200 mg at bedtime.  She is doing much better.  She is sleeping good.  She denies any mania or any crying spells.  She had a good holidays because she was able to see her daughter, brother and mother.  Her family lives close by.  She has 3 children who are 55, 15 and 9 year old and all live close by.  Her new job is going very well.  She recently was promoted.  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  Patient does not want to change the medication since it is working very well.  She reported her appetite is okay and her weight is stable.  She has a history of acute pancreatitis and she want to lose weight.  She has no tremors, shakes or any EPS.  Her nightmares and flashbacks are stable and does not interfere as much.   Past Psychiatric History:Reviewed H/Obipolar disorder,PTSDansmultipleinpatient.Lastinpatient inJuly 2020 at [facility] and than IOP.H/Ooverdose on lithium, trazodone,LexaproandMinipress. H/Opoor impulse controlwith excessive  talkingand excessive spending.[facility].TookSeroquel, Zoloft, Cymbalta, Paxil, Prozac, Ativan, Abilify, Wellbutrin, Minipress and lithium.     Psychiatric Specialty Exam: Physical Exam  Review of Systems  Weight 220 lb (99.8 kg).There is no height or weight on file to calculate BMI.  General Appearance: NA  Eye Contact:  NA  Speech:  Clear and Coherent  Volume:  Normal  Mood:  NA  Affect:  NA  Thought Process:  Goal Directed  Orientation:  Full (Time, Place, and Person)  Thought Content:  WDL  Suicidal Thoughts:  No  Homicidal Thoughts:  No  Memory:  Immediate;   Good Recent;   Good Remote;   Good  Judgement:  Good  Insight:  Good  Psychomotor Activity:  NA  Concentration:  Concentration: Good and Attention Span: Good  Recall:  Good  Fund of Knowledge:  Good  Language:  Good  Akathisia:  No  Handed:  Right  AIMS (if indicated):     Assets:  Communication Skills Desire for Improvement Housing Resilience Social Support Talents/Skills Transportation  ADL's:  Intact  Cognition:  WNL  Sleep:   improved      Assessment and Plan: Bipolar disorder, mixed type.  PTSD chronic.  Patient doing better since medicine doses were adjusted.  Encouraged to watch her calorie intake and monitor her weight.  She has a history of acute pancreatitis.  She is aware about it.  Continue Seroquel 100 mg in the morning and 200 mg at bedtime, Lexapro 5 mg daily and Lamictal  200 mg daily.  She has no rash.  Reminded blood work results.  Discussed medication side effects and benefits.  Recommended to call us back if she is any question or any concern.  Follow-up in 3 months.  Follow Up Instructions:    I discussed the assessment and treatment plan with the patient. The patient was provided an opportunity to ask questions and all were answered. The patient agreed with the plan and demonstrated an understanding of the instructions.    The patient was advised to call back or seek an in-person evaluation if the symptoms worsen or if the condition fails to improve as anticipated.  I provided 18 minutes of non-face-to-face time during this encounter.   Cleotis Nipper, MD

## 2020-07-07 ENCOUNTER — Telehealth (HOSPITAL_COMMUNITY): Payer: Self-pay | Admitting: Psychiatry

## 2020-09-22 ENCOUNTER — Other Ambulatory Visit: Payer: Self-pay

## 2020-09-22 ENCOUNTER — Telehealth (INDEPENDENT_AMBULATORY_CARE_PROVIDER_SITE_OTHER): Payer: Self-pay | Admitting: Psychiatry

## 2020-09-22 ENCOUNTER — Encounter (HOSPITAL_COMMUNITY): Payer: Self-pay | Admitting: Psychiatry

## 2020-09-22 DIAGNOSIS — F4310 Post-traumatic stress disorder, unspecified: Secondary | ICD-10-CM

## 2020-09-22 DIAGNOSIS — F319 Bipolar disorder, unspecified: Secondary | ICD-10-CM

## 2020-09-22 MED ORDER — QUETIAPINE FUMARATE 100 MG PO TABS: ORAL_TABLET | ORAL | 2 refills | Status: DC

## 2020-09-22 MED ORDER — LAMOTRIGINE 200 MG PO TABS: 200.0000 mg | ORAL_TABLET | Freq: Every day | ORAL | 2 refills | Status: DC

## 2020-09-22 NOTE — Progress Notes (Signed)
Virtual Visit via Telephone Note  I connected with Stefanie Galvan on 09/22/20 at  3:40 PM EDT by telephone and verified that I am speaking with the correct person using two identifiers.  Location: Patient: Home Provider: Home Office   I discussed the limitations, risks, security and privacy concerns of performing an evaluation and management service by telephone and the availability of in person appointments. I also discussed with the patient that there may be a patient responsible charge related to this service. The patient expressed understanding and agreed to proceed.   History of Present Illness: Patient is evaluated by phone session.  She has been doing very well and recently got another promotion and she is happy about second promotion and less than 7 months.  Her job is going well.  Her brother and mother leases are up and decided to move further and patient is happy about it because there are times that she has been a problem in her life.  Patient has a very good connection with her kids.  Her kids play volleyball and patient tried to visit them at least once a week and that gave her a lot of pleasure.  She denies any mania, hallucination, nightmares or any flashbacks.  She started doing meditation and that is helping her a lot.  She also reach out to her old friends and she noticed it is helping her mood.  She denies any irritability, highs and lows or any mania.  Her weight remains unchanged but she is trying to lose weight.  She has not scheduled PCP appointment in a while.  She denies any suicidal thoughts, feeling of hopelessness or any anhedonia.  Past Psychiatric History:Reviewed H/Obipolar disorder,PTSDansmultipleinpatient.Lastinpatient inJuly 2020 at [facility] and than IOP.H/Ooverdose on lithium, trazodone,LexaproandMinipress. H/Opoor impulse controlwith excessive talkingand excessive spending.H/[facility] inpatientat [facility].TookSeroquel, Zoloft, Cymbalta, Paxil, Prozac, Ativan, Abilify, Wellbutrin, Minipress and lithium.    Psychiatric Specialty Exam: Physical Exam  Review of Systems  Weight 220 lb (99.8 kg).There is no height or weight on file to calculate BMI.  General Appearance: NA  Eye Contact:  NA  Speech:  Clear and Coherent  Volume:  Normal  Mood:  Euthymic  Affect:  NA  Thought Process:  Goal Directed  Orientation:  Full (Time, Place, and Person)  Thought Content:  Logical  Suicidal Thoughts:  No  Homicidal Thoughts:  No  Memory:  Immediate;   Good Recent;   Good Remote;   Good  Judgement:  Good  Insight:  Present  Psychomotor Activity:  NA  Concentration:  Concentration: Good and Attention Span: Good  Recall:  Good  Fund of Knowledge:  Good  Language:  Good  Akathisia:  No  Handed:  Right  AIMS (if indicated):     Assets:  Communication Skills Desire for Improvement Housing Resilience Talents/Skills Transportation  ADL's:  Intact  Cognition:  WNL  Sleep:   good      Assessment and Plan: Bipolar disorder type I versus mixed type.  PTSD chronic.  We talked about reducing the medication as patient doing well and patient has prolonged QTC.  Patient agreed to give a try.  We will try discontinue Lexapro and reduce Seroquel from 300 mg/day to 100 mg twice a day but keep the Lamictal at present dose.  Patient feel her depression is much stable and she can try to cut down the dose of the Seroquel for now.  However she promised that if symptoms started to get worse  then she will call us back immediately.  I also recommend she should consult her PCP/PA Obuch to get another cardiology consultant and possible EKG to have QTC.  If we need to restart antidepressant or adjust the dose of mood stabilizer we will consider a different medication unless patient requested to be on Seroquel.  Recommended to call us back if she has any question or any concern.  Follow-up in 3 months.     Follow Up Instructions:    I discussed the assessment and treatment plan with the patient. The patient was provided an opportunity to ask questions and all were answered. The patient agreed with the plan and demonstrated an understanding of the instructions.   The patient was advised to call back or seek an in-person evaluation if the symptoms worsen or if the condition fails to improve as anticipated.  I provided 19 minutes of non-face-to-face time during this encounter.   Cleotis Nipper, MD

## 2020-12-13 ENCOUNTER — Other Ambulatory Visit: Payer: Self-pay

## 2020-12-13 ENCOUNTER — Emergency Department: Payer: Self-pay

## 2020-12-13 ENCOUNTER — Emergency Department
Admission: EM | Admit: 2020-12-13 | Discharge: 2020-12-13 | Disposition: A | Discharge status: Home / Self Care | Payer: Self-pay | Attending: Emergency Medicine | Admitting: Emergency Medicine

## 2020-12-13 DIAGNOSIS — R109 Unspecified abdominal pain: Secondary | ICD-10-CM | POA: Insufficient documentation

## 2020-12-13 DIAGNOSIS — R519 Headache, unspecified: Secondary | ICD-10-CM | POA: Insufficient documentation

## 2020-12-13 DIAGNOSIS — R479 Unspecified speech disturbances: Secondary | ICD-10-CM | POA: Insufficient documentation

## 2020-12-13 DIAGNOSIS — R42 Dizziness and giddiness: Secondary | ICD-10-CM | POA: Insufficient documentation

## 2020-12-13 LAB — CBC
HCT: 34.9 % — ABNORMAL LOW (ref 36.0–46.0)
Hemoglobin: 12.1 g/dL (ref 12.0–15.0)
MCH: 28.3 pg (ref 26.0–34.0)
MCHC: 34.7 g/dL (ref 30.0–36.0)
MCV: 81.7 fL (ref 80.0–100.0)
Platelets: 209 10*3/uL (ref 150–400)
RBC: 4.27 MIL/uL (ref 3.87–5.11)
RDW: 13.1 % (ref 11.5–15.5)
WBC: 5.4 10*3/uL (ref 4.0–10.5)
nRBC: 0 % (ref 0.0–0.2)

## 2020-12-13 LAB — DIFFERENTIAL
Abs Immature Granulocytes: 0.01 10*3/uL (ref 0.00–0.07)
Basophils Absolute: 0 10*3/uL (ref 0.0–0.1)
Basophils Relative: 1 %
Eosinophils Absolute: 0.2 10*3/uL (ref 0.0–0.5)
Eosinophils Relative: 4 %
Immature Granulocytes: 0 %
Lymphocytes Relative: 36 %
Lymphs Abs: 1.9 10*3/uL (ref 0.7–4.0)
Monocytes Absolute: 0.4 10*3/uL (ref 0.1–1.0)
Monocytes Relative: 7 %
Neutro Abs: 2.9 10*3/uL (ref 1.7–7.7)
Neutrophils Relative %: 52 %

## 2020-12-13 LAB — COMPREHENSIVE METABOLIC PANEL
ALT: 32 U/L (ref 0–44)
AST: 25 U/L (ref 15–41)
Albumin: 4.4 g/dL (ref 3.5–5.0)
Alkaline Phosphatase: 76 U/L (ref 38–126)
Anion gap: 7 (ref 5–15)
BUN: 14 mg/dL (ref 6–20)
CO2: 24 mmol/L (ref 22–32)
Calcium: 9.1 mg/dL (ref 8.9–10.3)
Chloride: 108 mmol/L (ref 98–111)
Creatinine, Ser: 0.54 mg/dL (ref 0.44–1.00)
GFR, Estimated: >60 mL/min (ref >60)
Glucose, Bld: 101 mg/dL — ABNORMAL HIGH (ref 70–99)
Potassium: 3.6 mmol/L (ref 3.5–5.1)
Sodium: 139 mmol/L (ref 135–145)
Total Bilirubin: 0.6 mg/dL (ref 0.3–1.2)
Total Protein: 7.2 g/dL (ref 6.5–8.1)

## 2020-12-13 LAB — APTT: aPTT: 29 seconds (ref 24–36)

## 2020-12-13 LAB — PROTIME-INR
INR: 1 (ref 0.8–1.2)
Prothrombin Time: 13.3 seconds (ref 11.4–15.2)

## 2020-12-13 MED ORDER — SODIUM CHLORIDE 0.9 % IV BOLUS
1000.0000 mL | Freq: Once | INTRAVENOUS | Status: AC
  Administered 2020-12-13: 1000 mL via INTRAVENOUS

## 2020-12-13 MED ORDER — METOCLOPRAMIDE HCL 5 MG/ML IJ SOLN
10.0000 mg | Freq: Once | INTRAMUSCULAR | Status: AC
  Administered 2020-12-13: 10 mg via INTRAVENOUS
  Filled 2020-12-13: qty 2

## 2020-12-13 NOTE — ED Triage Notes (Signed)
Pt to ED from [facility] for headache, dizziness for 2 days. Reports difficulty getting words at 830 this am. Hx brain injury years ago.  Pt speaking in clear sentences, alert and oriented.  Denies recent falls  No code stroke per Dr Vicente Males

## 2020-12-13 NOTE — ED Notes (Signed)
FIRST RN: pt c/o headache x 2 days with difficulty speaking words x 2 days also.

## 2020-12-13 NOTE — ED Notes (Signed)
Pt ambulatory to the restroom at this time.  

## 2020-12-13 NOTE — ED Provider Notes (Signed)
[facility] Emergency Department Provider Note  ____________________________________________   Event Date/Time   First MD Initiated Contact with Patient 12/13/20 1335     (approximate)  I have reviewed the triage vital signs and the nursing notes.   HISTORY  Chief Complaint Dizziness    HPI Stefanie Galvan is a 55 y.o. female presents to the emergency department with dizziness and headache for 2 days along with loss of words earlier today.  Patient states she has a history of a TBI.  States she became very concerned when she could not remember words to express what she could see in her head.  States that her urine is very dark and she wonders if she is dehydrated.  She denies nausea or vomiting.  She denies chest pain or shortness of breath.  No new head injury at this time.  Patient states that her bipolar meds consist of Seroquel and Lamictal.  States this is made her feel much better than she has ever felt.  She has been on these medications for 2 years.  Started a cholesterol medicine recently.  Past Medical History:  Diagnosis Date   Anxiety    Arthritis    Depression 2.5 yrs ago   situational dep-no current tx/meds   TBI (traumatic brain injury) (HCC) 11/10/2005    Patient Active Problem List   Diagnosis Date Noted   Pancreatitis 02/26/2020   AKI (acute kidney injury) (HCC) 02/26/2020   Lactic acidosis 02/26/2020   Abdominal pain 02/26/2020   Elevated liver enzymes 02/26/2020   Bipolar 1 disorder, depressed, severe (HCC) 12/22/2018   Generalized anxiety disorder    MDD (major depressive disorder), recurrent episode, severe (HCC) 12/15/2018   Diverticulosis of sigmoid colon 11/30/2018   Biliary colic 07/01/2018   Bipolar affective (HCC) 09/24/2017   Lithium overdose 09/23/2017   Lithium overdose, intentional self-harm, initial encounter (HCC) 09/22/2017   QT prolongation 09/22/2017   Moderate bipolar I disorder, most recent episode  depressed (HCC) 03/19/2017   Bipolar 2 disorder, major depressive episode (HCC) 03/04/2017    Class: Chronic   Chronic post-traumatic stress disorder (PTSD) 03/04/2017    Class: Chronic   DDD (degenerative disc disease), lumbar 03/04/2017   Arthritis 08/29/2016    Past Surgical History:  Procedure Laterality Date   BREAST REDUCTION SURGERY  02/25/2012   Procedure: MAMMARY REDUCTION  (BREAST);  Surgeon: Louisa Second, MD;  Location: [facility];  Service: Plastics;  Laterality: Bilateral;  bilateral reduction   CHOLECYSTECTOMY N/A 07/01/2018   Procedure: LAPAROSCOPIC CHOLECYSTECTOMY;  Surgeon: Sung Amabile, DO;  Location: [facility];  Service: General;  Laterality: N/A;   DILATION AND CURETTAGE OF UTERUS  2001   TEMPOROMANDIBULAR JOINT SURGERY  1984; 1996   X 2; bilateral   TUBAL LIGATION  2003    Prior to Admission medications   Medication Sig Start Date End Date Taking? Authorizing Provider  cyclobenzaprine (FLEXERIL) 5 MG tablet Take 1 tablet (5 mg total) by mouth 3 (three) times daily. Patient not taking: Reported on 10/14/2019 12/18/18   Aldean Baker, NP  docusate sodium (COLACE) 100 MG capsule Take 1 capsule (100 mg total) by mouth daily. 02/26/20   Sharyon Cable, CNM  escitalopram (LEXAPRO) 5 MG tablet Take 1 tablet (5 mg total) by mouth daily. Patient not taking: Reported on 09/22/2020 06/24/20   Arfeen, Phillips Grout, MD  hydrOXYzine (VISTARIL) 25 MG capsule Take 1 capsule (25 mg total) by mouth daily as needed for anxiety. Patient  not taking: Reported on 01/15/2020 07/16/19   Arfeen, Phillips Grout, MD  ibuprofen (ADVIL) 200 MG tablet Take 600 mg by mouth every 6 (six) hours as needed for moderate pain.    [provider]  lamoTRIgine (LAMICTAL) 200 MG tablet Take 1 tablet (200 mg total) by mouth daily. 09/22/20   Arfeen, Phillips Grout, MD  QUEtiapine (SEROQUEL) 100 MG tablet Take one tab am and at bed time 09/22/20   Arfeen, Phillips Grout, MD  simethicone (GAS-X) 80 MG chewable tablet  Chew 1 tablet (80 mg total) by mouth every 6 (six) hours as needed for flatulence. 02/26/20   Sharyon Cable, CNM    Allergies Anaprox [naproxen sodium]  Family History  Problem Relation Age of Onset   Depression Father    Anxiety disorder Brother    Depression Maternal Grandmother     Social History Social History   Tobacco Use   Smoking status: Never   Smokeless tobacco: Never  Vaping Use   Vaping Use: Never used  Substance Use Topics   Alcohol use: Not Currently   Drug use: No    Review of Systems  Constitutional: No fever/chills Eyes: No visual changes. ENT: No sore throat. Respiratory: Denies cough Cardiovascular: Denies chest pain Gastrointestinal: Denies abdominal pain Genitourinary: Negative for dysuria. Musculoskeletal: Negative for back pain. Skin: Negative for rash. Psychiatric: no mood changes,     ____________________________________________   PHYSICAL EXAM:  VITAL SIGNS: ED Triage Vitals  Enc Vitals Group     BP 12/13/20 1232 (!) 147/98     Pulse Rate 12/13/20 1232 72     Resp 12/13/20 1232 18     Temp 12/13/20 1232 98 F (36.7 C)     Temp Source 12/13/20 1232 Oral     SpO2 12/13/20 1232 96 %     Weight 12/13/20 1235 222 lb (100.7 kg)     Height 12/13/20 1235 5\' 7"  (1.702 m)     Head Circumference --      Peak Flow --      Pain Score 12/13/20 1234 8     Pain Loc --      Pain Edu? --      Excl. in GC? --     Constitutional: Alert and oriented. Well appearing and in no acute distress. Eyes: Conjunctivae are normal.  PERRL, 2 beats nystagmus bilaterally, reproduces dizziness Head: Atraumatic. Nose: No congestion/rhinnorhea. Mouth/Throat: Mucous membranes are moist.   Neck:  supple no lymphadenopathy noted Cardiovascular: Normal rate, regular rhythm. Heart sounds are normal Respiratory: Normal respiratory effort.  No retractions, lungs c t a  Abd: soft nontender bs normal all 4 quad GU: deferred Musculoskeletal: FROM all  extremities, warm and well perfused Neurologic:  Normal speech and language.  Grips equal bilaterally, 5/5 strength in lower extremities Skin:  Skin is warm, dry and intact. No rash noted. Psychiatric: Mood and affect are normal. Speech and behavior are normal.  ____________________________________________   LABS (all labs ordered are listed, but only abnormal results are displayed)  Labs Reviewed  CBC - Abnormal; Notable for the following components:      Result Value   HCT 34.9 (*)    All other components within normal limits  COMPREHENSIVE METABOLIC PANEL - Abnormal; Notable for the following components:   Glucose, Bld 101 (*)    All other components within normal limits  PROTIME-INR  APTT  DIFFERENTIAL   ____________________________________________   ____________________________________________  RADIOLOGY  CT of the head  ____________________________________________  PROCEDURES  Procedure(s) performed: No  Procedures    ____________________________________________   INITIAL IMPRESSION / ASSESSMENT AND PLAN / ED COURSE  Pertinent labs & imaging results that were available during my care of the patient were reviewed by me and considered in my medical decision making (see chart for details).   The patient is a 55 year old female presents with headache, dizziness, and loss of words.  See HPI.  Physical exam shows patient appears stable  DDx: TIA, CVA, migraine, dehydration  CBC, metabolic panel, PT PTT are all normal  CT of the head reviewed by me confirmed by radiology to have no acute abnormality  The patient was given normal saline 1 L IV, Reglan IV, withheld Toradol until CT result.  Nursing staff notified me that the patient removed her IV and left after partial fluids.  She has eloped from the emergency department  Macarthur Critchley was evaluated in Emergency Department on 12/13/2020 for the symptoms described in the history of present illness.  She was evaluated in the context of the global COVID-19 pandemic, which necessitated consideration that the patient might be at risk for infection with the SARS-CoV-2 virus that causes COVID-19. Institutional protocols and algorithms that pertain to the evaluation of patients at risk for COVID-19 are in a state of rapid change based on information released by regulatory bodies including the CDC and federal and state organizations. These policies and algorithms were followed during the patient's care in the ED.    As part of my medical decision making, I reviewed the following data within the electronic MEDICAL RECORD NUMBER Nursing notes reviewed and incorporated, Labs reviewed , Old chart reviewed, Radiograph reviewed , Notes from prior ED visits, and [place] Controlled Substance Database  ____________________________________________   FINAL CLINICAL IMPRESSION(S) / ED DIAGNOSES  Final diagnoses:  Dizziness      NEW MEDICATIONS STARTED DURING THIS VISIT:  New Prescriptions   No medications on file     Note:  This document was prepared using Dragon voice recognition software and may include unintentional dictation errors.    Faythe Ghee, PA-C 12/13/20 1506    Sharman Cheek, MD 12/15/20 [phone]

## 2020-12-13 NOTE — ED Notes (Signed)
This RN at bedside to reconnect pt to the monitor. Pt does not seem to be in the room. Pt's IV removed and sitting on the table. Attempted to call pt's with no answer.

## 2020-12-20 ENCOUNTER — Telehealth (HOSPITAL_COMMUNITY): Payer: Self-pay | Admitting: Psychiatry

## 2020-12-20 ENCOUNTER — Other Ambulatory Visit: Payer: Self-pay

## 2021-01-09 ENCOUNTER — Other Ambulatory Visit (HOSPITAL_COMMUNITY): Payer: Self-pay | Admitting: *Deleted

## 2021-01-09 ENCOUNTER — Telehealth (HOSPITAL_COMMUNITY): Payer: Self-pay | Admitting: *Deleted

## 2021-01-09 DIAGNOSIS — F319 Bipolar disorder, unspecified: Secondary | ICD-10-CM

## 2021-01-09 MED ORDER — QUETIAPINE FUMARATE 100 MG PO TABS: ORAL_TABLET | ORAL | 1 refills | Status: DC

## 2021-01-09 MED ORDER — LAMOTRIGINE 200 MG PO TABS: 200.0000 mg | ORAL_TABLET | Freq: Every day | ORAL | 1 refills | Status: DC

## 2021-01-09 NOTE — Telephone Encounter (Signed)
Ok to refill if no issues.

## 2021-01-09 NOTE — Telephone Encounter (Signed)
Pt called requesting refills of Lamictal and Seroquel, last written 09/22/20 with 2 refills. However pt no showed on 12/20/20 and has is now scheduled for 02/28/21. Ok to fill bridge?

## 2021-02-28 ENCOUNTER — Other Ambulatory Visit: Payer: Self-pay

## 2021-02-28 ENCOUNTER — Telehealth (INDEPENDENT_AMBULATORY_CARE_PROVIDER_SITE_OTHER): Payer: Self-pay | Admitting: Psychiatry

## 2021-02-28 ENCOUNTER — Encounter (HOSPITAL_COMMUNITY): Payer: Self-pay | Admitting: Psychiatry

## 2021-02-28 VITALS — Wt 206.0 lb

## 2021-02-28 DIAGNOSIS — F319 Bipolar disorder, unspecified: Secondary | ICD-10-CM

## 2021-02-28 DIAGNOSIS — F4310 Post-traumatic stress disorder, unspecified: Secondary | ICD-10-CM

## 2021-02-28 MED ORDER — QUETIAPINE FUMARATE 100 MG PO TABS: ORAL_TABLET | ORAL | 0 refills | Status: DC

## 2021-02-28 MED ORDER — LAMOTRIGINE 200 MG PO TABS: 200.0000 mg | ORAL_TABLET | Freq: Every day | ORAL | 0 refills | Status: DC

## 2021-02-28 NOTE — Progress Notes (Signed)
Virtual Visit via Telephone Note  I connected with Stefanie Galvan on 02/28/21 at  4:20 PM EDT by telephone and verified that I am speaking with the correct person using two identifiers.  Location: Patient: Home Provider: Home Office   I discussed the limitations, risks, security and privacy concerns of performing an evaluation and management service by telephone and the availability of in person appointments. I also discussed with the patient that there may be a patient responsible charge related to this service. The patient expressed understanding and agreed to proceed.   History of Present Illness: Patient is evaluated by phone session.  She has been doing very well and reported the medicine helping her mood and anxiety.  She switched her job but is still in the same company.  She continues to play volleyball and tried to keep herself busy.  Her mother lives next door and she tried to keep in touch with her about she knows her limitations.  She is sleeping good.  She is trying to lose weight and lost 10 to 12 pounds since the last visit.  She denies any crying spells, feeling of hopelessness or worthlessness.  She denies any suicidal thoughts.  She denies any nightmares flashback or any mania or any impulsive behavior.  She sleeps good.  She has no tremor or shakes or any EPS and like to keep the current medication.  Past Psychiatric History: Reviewed H/O bipolar disorder, PTSD ans multiple inpatient. Last inpatient in July 2020 at [facility] and than IOP. H/O overdose on lithium, trazodone, Lexapro and Minipress. H/O poor impulse control with excessive talking and excessive spending. H/O inpatient at old [facility], [facility], charter and [facility] Took Seroquel, Zoloft, Cymbalta, Paxil, Prozac, Ativan, Abilify, Wellbutrin, Minipress and lithium.     Psychiatric Specialty Exam: Physical Exam  Review of Systems  Weight 206 lb (93.4 kg).There is no height or weight on file to calculate BMI.   General Appearance: NA  Eye Contact:  NA  Speech:  Clear and Coherent  Volume:  Normal  Mood:  Euthymic  Affect:  NA  Thought Process:  Goal Directed  Orientation:  Full (Time, Place, and Person)  Thought Content:  WDL  Suicidal Thoughts:  No  Homicidal Thoughts:  No  Memory:  Immediate;   Good Recent;   Good Remote;   Good  Judgement:  Intact  Insight:  Good  Psychomotor Activity:  NA  Concentration:  Concentration: Good and Attention Span: Good  Recall:  Good  Fund of Knowledge:  Good  Language:  Good  Akathisia:  No  Handed:  Right  AIMS (if indicated):     Assets:  Communication Skills Desire for Improvement Housing Resilience Social Support Talents/Skills Transportation  ADL's:  Intact  Cognition:  WNL  Sleep:   ok      Assessment and Plan: Bipolar disorder type I.  PTSD.  Patient is stable on her current medication.  Continue Seroquel 100 mg twice a day and Lamictal 200 mg daily.  Discussed medication side effects and benefits.  Encouraged to keep appointment with her PCP.  Recommended to call us back if she has any question or any concern.  Follow-up in 3 months.   Follow Up Instructions:    I discussed the assessment and treatment plan with the patient. The patient was provided an opportunity to ask questions and all were answered. The patient agreed with the plan and demonstrated an understanding of the instructions.   The patient was advised to  call back or seek an in-person evaluation if the symptoms worsen or if the condition fails to improve as anticipated.  I provided 12 minutes of non-face-to-face time during this encounter.   Cleotis Nipper, MD

## 2021-05-17 ENCOUNTER — Other Ambulatory Visit: Payer: Self-pay

## 2021-05-17 ENCOUNTER — Telehealth (HOSPITAL_BASED_OUTPATIENT_CLINIC_OR_DEPARTMENT_OTHER): Payer: Self-pay | Admitting: Psychiatry

## 2021-05-17 ENCOUNTER — Encounter (HOSPITAL_COMMUNITY): Payer: Self-pay | Admitting: Psychiatry

## 2021-05-17 DIAGNOSIS — F319 Bipolar disorder, unspecified: Secondary | ICD-10-CM

## 2021-05-17 MED ORDER — QUETIAPINE FUMARATE 100 MG PO TABS: ORAL_TABLET | ORAL | 0 refills | Status: DC

## 2021-05-17 MED ORDER — LAMOTRIGINE 200 MG PO TABS: 200.0000 mg | ORAL_TABLET | Freq: Every day | ORAL | 0 refills | Status: DC

## 2021-05-17 NOTE — Progress Notes (Signed)
Virtual Visit via Telephone Note  I connected with Stefanie Galvan on 05/17/21 at  4:20 PM EST by telephone and verified that I am speaking with the correct person using two identifiers.  Location: Patient: Work Provider: Biomedical scientist   I discussed the limitations, risks, security and privacy concerns of performing an evaluation and management service by telephone and the availability of in person appointments. I also discussed with the patient that there may be a patient responsible charge related to this service. The patient expressed understanding and agreed to proceed.   History of Present Illness: Patient is evaluated by phone session.  She is taking her medication.  She is disappointed because she did not lost a lot of weight but also did not gain weight.  She admitted not able to play of volleyball because job is busy and she is hoping once spring starts she will join again.  She saw her mother on Thanksgiving and admitted that triggers her symptoms for the brief amount of time because she was upset with family members.  Patient told mother decided to go [employer] and they have to wait for 45 minutes and that makes everyone for anxious.  However she realized that her mother will never change and she just needs to handle her better.  She is going to see her again in Christmas but she has plan to ignore her because she like to enjoy the company of her other family members.  She is sleeping good.  She denies any nightmares or flashback.  She denies any mania, psychosis or any hallucination.  She was happy to see her daughter and her boyfriend on Thanksgiving.  Her son did not come because he went to [place] with his wife.  Patient denies any crying spells or any feeling of hopelessness or worthlessness.  Her job is going very well.  She like to keep the current medication.  She has not seen PCP since April but next April she will have insurance to schedule appointment with the PCP.    Past Psychiatric History: Reviewed H/O bipolar, PTSD and poor impulse control with excessive talking and spending. H/O inpatient [facility], [facility] and [facility]. Last inpatient in July 2020 at [facility] and than IOP. H/O overdose on lithium, trazodone, Lexapro and Minipress. Took Seroquel, Zoloft, Cymbalta, Paxil, Prozac, Ativan, Abilify, Wellbutrin, Minipress and lithium.     Psychiatric Specialty Exam: Physical Exam  Review of Systems  Weight 200 lb (90.7 kg).There is no height or weight on file to calculate BMI.  General Appearance: NA  Eye Contact:  NA  Speech:  Clear and Coherent  Volume:  Normal  Mood:  Anxious  Affect:  NA  Thought Process:  Goal Directed  Orientation:  Full (Time, Place, and Person)  Thought Content:  Logical  Suicidal Thoughts:  No  Homicidal Thoughts:  No  Memory:  Immediate;   Good Recent;   Good Remote;   Good  Judgement:  Intact  Insight:  Present  Psychomotor Activity:  NA  Concentration:  Concentration: Good and Attention Span: Good  Recall:  Good  Fund of Knowledge:  Good  Language:  Good  Akathisia:  No  Handed:  Right  AIMS (if indicated):     Assets:  Communication Skills Desire for Improvement Housing Resilience Social Support Talents/Skills Transportation  ADL's:  Intact  Cognition:  WNL  Sleep:   ok      Assessment and Plan: Bipolar disorder type I.  PTSD.  Discuss  family issues especially related to her mother.  Patient understand that she needs to keep a distance and limited boundaries with the mother.  She is hoping that she can enjoy Christmas with her cousins and other family members.  She does not want to change the medication.  Continue Seroquel 100 mg 2 times a day and Lamictal 200 mg daily.  She has no rash or any itching.  Patient will schedule appointment with PCP in April since she has insurance.  Recommended to call us back if she is any question or any concern.  Follow-up in 3 months.  Follow Up  Instructions:    I discussed the assessment and treatment plan with the patient. The patient was provided an opportunity to ask questions and all were answered. The patient agreed with the plan and demonstrated an understanding of the instructions.   The patient was advised to call back or seek an in-person evaluation if the symptoms worsen or if the condition fails to improve as anticipated.  I provided 19 minutes of non-face-to-face time during this encounter.   Cleotis Nipper, MD

## 2021-05-30 ENCOUNTER — Telehealth (HOSPITAL_COMMUNITY): Payer: Self-pay | Admitting: Psychiatry

## 2021-08-02 ENCOUNTER — Telehealth (HOSPITAL_BASED_OUTPATIENT_CLINIC_OR_DEPARTMENT_OTHER): Payer: Self-pay | Admitting: Psychiatry

## 2021-08-02 ENCOUNTER — Encounter (HOSPITAL_COMMUNITY): Payer: Self-pay | Admitting: Psychiatry

## 2021-08-02 ENCOUNTER — Other Ambulatory Visit: Payer: Self-pay

## 2021-08-02 VITALS — Wt 202.0 lb

## 2021-08-02 DIAGNOSIS — F4310 Post-traumatic stress disorder, unspecified: Secondary | ICD-10-CM

## 2021-08-02 DIAGNOSIS — F319 Bipolar disorder, unspecified: Secondary | ICD-10-CM

## 2021-08-02 MED ORDER — QUETIAPINE FUMARATE 200 MG PO TABS: 200.0000 mg | ORAL_TABLET | Freq: Every day | ORAL | 0 refills | Status: DC

## 2021-08-02 MED ORDER — LAMOTRIGINE 200 MG PO TABS: 200.0000 mg | ORAL_TABLET | Freq: Every day | ORAL | 0 refills | Status: DC

## 2021-08-02 NOTE — Progress Notes (Signed)
Virtual Visit via Telephone Note  I connected with Stefanie Galvan on 08/02/21 at  4:20 PM EST by telephone and verified that I am speaking with the correct person using two identifiers.  Location: Patient: Work Provider: Economist   I discussed the limitations, risks, security and privacy concerns of performing an evaluation and management service by telephone and the availability of in person appointments. I also discussed with the patient that there may be a patient responsible charge related to this service. The patient expressed understanding and agreed to proceed.   History of Present Illness: Patient is evaluated by phone session.  She like her new job as she is now have insurance.  She was seen in the emergency room because of abdominal pain and her labs are normal but she was told possibility of diverticulitis.  She is scheduled to see PCP in March.  Patient told she is still sometimes having issues with her mother but she tried to have a limit setting with her and most of the time it does work.  She admitted learn how to cope and keep a distance from her mother.  She is taking Seroquel 200 mg at bedtime as splitting the dose sometimes causes grogginess in the morning.  She is sleeping better.  Patient told she is excited to start the volleyball season which is coming up soon.  She admitted despite watching her calorie intake she has not able to lose more weight.  She is not sure if the diverticulitis or something else causing weight gain.  She is hoping to address this issue with upcoming appointment with the PCP.  Since started the new job in November she is more relaxed and making more money.  She is not working second job.  She had changed her eating habits.  She denies any mania, psychosis, hallucination.  Occasionally she has nightmares and flashback but overall her sleep is improved from the past.  She like to keep the current medication.  She is taking Seroquel and Lamictal.  She  has no rash or any itching.   Past Psychiatric History: Reviewed H/O bipolar, PTSD and poor impulse control with excessive talking and spending. H/O inpatient [facility], [facility] and [facility]. Last inpatient in July 2020 at [facility] and than IOP. H/O overdose on lithium, trazodone, Lexapro and Minipress. Took Seroquel, Zoloft, Cymbalta, Paxil, Prozac, Ativan, Abilify, Wellbutrin, Minipress and lithium.    No results found for this or any previous visit (from the past 2160 hour(s)).   Psychiatric Specialty Exam: Physical Exam  Review of Systems  Weight 202 lb (91.6 kg).There is no height or weight on file to calculate BMI.  General Appearance: NA  Eye Contact:  NA  Speech:  Normal Rate  Volume:  Normal  Mood:  Anxious  Affect:  NA  Thought Process:  Goal Directed  Orientation:  Full (Time, Place, and Person)  Thought Content:  Logical  Suicidal Thoughts:  No  Homicidal Thoughts:  No  Memory:  Immediate;   Good Recent;   Good Remote;   Good  Judgement:  Intact  Insight:  Present  Psychomotor Activity:  NA  Concentration:  Concentration: Fair and Attention Span: Fair  Recall:  Good  Fund of Knowledge:  Good  Language:  Good  Akathisia:  No  Handed:  Right  AIMS (if indicated):     Assets:  Communication Skills Desire for Improvement Housing Social Support  ADL's:  Intact  Cognition:  WNL  Sleep:  Assessment and Plan: Bipolar disorder type I.  PTSD.  Patient doing better on her medication and able to keep her emotions manageable when dealing with her mother.  I reviewed blood work results which was done last month when she was in the ED.  Her labs are normal but she will see a PCP if she needed more work-up for diverticulitis.  She will also address her weight as trying healthy eating and watching her calorie intake has not made a huge impact in her weight.  Discussed medication side effects and benefits.  We will continue Lamictal 200 mg daily and Seroquel 200  mg at bedtime.  Recommended to call us back if she is any question or any concern.  Follow-up in 3 months.  Follow Up Instructions:    I discussed the assessment and treatment plan with the patient. The patient was provided an opportunity to ask questions and all were answered. The patient agreed with the plan and demonstrated an understanding of the instructions.   The patient was advised to call back or seek an in-person evaluation if the symptoms worsen or if the condition fails to improve as anticipated.  I provided 23 minutes of non-face-to-face time during this encounter.   Kathlee Nations, MD

## 2021-08-16 ENCOUNTER — Telehealth (HOSPITAL_COMMUNITY): Payer: Self-pay | Admitting: Psychiatry

## 2021-10-31 ENCOUNTER — Telehealth (HOSPITAL_BASED_OUTPATIENT_CLINIC_OR_DEPARTMENT_OTHER): Payer: Self-pay | Admitting: Psychiatry

## 2021-10-31 ENCOUNTER — Encounter (HOSPITAL_COMMUNITY): Payer: Self-pay | Admitting: Psychiatry

## 2021-10-31 DIAGNOSIS — F319 Bipolar disorder, unspecified: Secondary | ICD-10-CM

## 2021-10-31 MED ORDER — LAMOTRIGINE 200 MG PO TABS: 200.0000 mg | ORAL_TABLET | Freq: Every day | ORAL | 0 refills | Status: DC

## 2021-10-31 MED ORDER — QUETIAPINE FUMARATE 200 MG PO TABS: 200.0000 mg | ORAL_TABLET | Freq: Every day | ORAL | 0 refills | Status: DC

## 2021-10-31 NOTE — Progress Notes (Signed)
Virtual Visit via Telephone Note ? ?I connected with Macarthur Critchley on 10/31/21 at  4:20 PM EDT by telephone and verified that I am speaking with the correct person using two identifiers. ? ?Location: ?Patient: Work ?Provider: Home Office ?  ?I discussed the limitations, risks, security and privacy concerns of performing an evaluation and management service by telephone and the availability of in person appointments. I also discussed with the patient that there may be a patient responsible charge related to this service. The patient expressed understanding and agreed to proceed. ? ? ?History of Present Illness: ?Patient is evaluated by phone session.  She is doing very well on her current medication.  She is pleased that her mother and brother moved away few miles from her and she does not have to see them every day.  She is more engage in other activities she usually enjoy.  Her job is going very well.  Recently she had a visit to [facility] with her friend and she reported it was a very good visit.  Now she has more trips coming up with the same friend to [place] and [place].  She is sleeping good.  She denies any irritability, anger, mania, psychosis or any hallucination.  She is connected to her 3 kids who lives close by.  Her son and daughter lives in [place] and another son lives in [place].  Last week her daughter got engaged and she was pleased.  She was hoping to start volleyball but now it is the same time when her kids play volleyball so she would like to watch playing them.  She may consider sand volleyball in the future.  Her focus is to keep herself busy with her friends, traveling and spend time with the kids.  Occasionally she has nightmares and flashback but overall she sleeps good.  She has not seen her PCP but hoping to schedule appointment very soon for annual and blood work.  She is exercising every day and lost 3 pounds since the last visit.  She has no tremors,  shakes or any EPS. ? ? ?Past Psychiatric History: Reviewed ?H/O bipolar, PTSD and poor impulse control with excessive talking and spending. H/O inpatient [facility], [facility] and [facility]. Last inpatient in July 2020 at [facility] and than IOP. H/O overdose on lithium, trazodone, Lexapro and Minipress. Took Seroquel, Zoloft, Cymbalta, Paxil, Prozac, Ativan, Abilify, Wellbutrin, Minipress and lithium.   ? ?Psychiatric Specialty Exam: ?Physical Exam  ?Review of Systems  ?Weight 198 lb (89.8 kg).There is no height or weight on file to calculate BMI.  ?General Appearance: NA  ?Eye Contact:  NA  ?Speech:  Clear and Coherent and Normal Rate  ?Volume:  Normal  ?Mood:  Euthymic  ?Affect:  NA  ?Thought Process:  Goal Directed  ?Orientation:  Full (Time, Place, and Person)  ?Thought Content:  Logical  ?Suicidal Thoughts:  No  ?Homicidal Thoughts:  No  ?Memory:  Immediate;   Good ?Recent;   Good ?Remote;   Good  ?Judgement:  Good  ?Insight:  Good  ?Psychomotor Activity:  NA  ?Concentration:  Concentration: Good and Attention Span: Good  ?Recall:  Good  ?Fund of Knowledge:  Good  ?Language:  Good  ?Akathisia:  No  ?Handed:  Right  ?AIMS (if indicated):     ?Assets:  Communication Skills ?Desire for Improvement ?Housing ?Social Support ?Talents/Skills ?Transportation  ?ADL's:  Intact  ?Cognition:  WNL  ?Sleep:   7-8 hrs, some times nightmares  and flash back  ? ? ? ? ?Assessment and Plan: ?Bipolar disorder type I.  PTSD. ? ?Patient is doing much better on her current medication.  She is keeping herself active and busy.  Her job is going well.  She does not want to change the medication.  Continue Seroquel 200 mg at bedtime and Lamictal 200 mg daily.  She has no rash or any itching.  I encouraged to keep appointment with her primary care physician for blood work.  Recommended to call us back if she has any question or any concern.  Follow-up in 3 months. ? ?Follow Up Instructions: ? ?  ?I discussed the assessment and treatment plan  with the patient. The patient was provided an opportunity to ask questions and all were answered. The patient agreed with the plan and demonstrated an understanding of the instructions. ?  ?The patient was advised to call back or seek an in-person evaluation if the symptoms worsen or if the condition fails to improve as anticipated. ? ?Collaboration of Care: Primary Care Provider AEB recommended to schedule appointment for physical and blood work. ? ?Patient/Guardian was advised Release of Information must be obtained prior to any record release in order to collaborate their care with an outside provider. Patient/Guardian was advised if they have not already done so to contact the registration department to sign all necessary forms in order for [place] to release information regarding their care.  ? ?Consent: Patient/Guardian gives verbal consent for treatment and assignment of benefits for services provided during this visit. Patient/Guardian expressed understanding and agreed to proceed.   ? ?I provided 16 minutes of non-face-to-face time during this encounter. ? ? ?Cleotis Nipper, MD  ?

## 2021-11-08 ENCOUNTER — Emergency Department (HOSPITAL_COMMUNITY): Payer: Self-pay

## 2021-11-08 ENCOUNTER — Emergency Department (HOSPITAL_COMMUNITY)
Admission: EM | Admit: 2021-11-08 | Discharge: 2021-11-08 | Disposition: A | Discharge status: Home / Self Care | Payer: Self-pay | Attending: Student | Admitting: Student

## 2021-11-08 ENCOUNTER — Other Ambulatory Visit: Payer: Self-pay

## 2021-11-08 ENCOUNTER — Encounter (HOSPITAL_COMMUNITY): Payer: Self-pay

## 2021-11-08 DIAGNOSIS — Y92480 Sidewalk as the place of occurrence of the external cause: Secondary | ICD-10-CM | POA: Insufficient documentation

## 2021-11-08 DIAGNOSIS — M25532 Pain in left wrist: Secondary | ICD-10-CM | POA: Insufficient documentation

## 2021-11-08 DIAGNOSIS — W101XXA Fall (on)(from) sidewalk curb, initial encounter: Secondary | ICD-10-CM | POA: Insufficient documentation

## 2021-11-08 DIAGNOSIS — M79642 Pain in left hand: Secondary | ICD-10-CM | POA: Insufficient documentation

## 2021-11-08 DIAGNOSIS — M79632 Pain in left forearm: Secondary | ICD-10-CM | POA: Insufficient documentation

## 2021-11-08 DIAGNOSIS — Y9301 Activity, walking, marching and hiking: Secondary | ICD-10-CM | POA: Insufficient documentation

## 2021-11-08 MED ORDER — HYDROCODONE-ACETAMINOPHEN 5-325 MG PO TABS
1.0000 | ORAL_TABLET | Freq: Once | ORAL | Status: AC
  Administered 2021-11-08: 1 via ORAL
  Filled 2021-11-08: qty 1

## 2021-11-08 NOTE — ED Provider Notes (Signed)
[facility]-EMERGENCY DEPT Provider Note   CSN: [id number] Arrival date & time: 11/08/21  1642     History  Chief Complaint  Patient presents with   Left Wrist Pain    Stefanie Galvan is a 56 y.o. female.  HPI Patient is a 56 year old female who presents to the emergency department due to a fall that occurred prior to arrival.  She states that she was walking and tripped over the curb of the driveway and landed on her left arm.  Reports significant pain in the left distal forearm that worsens with any movement.  Denies any head trauma or LOC.  She is right-hand dominant.    Home Medications Prior to Admission medications   Medication Sig Start Date End Date Taking? Authorizing Provider  escitalopram (LEXAPRO) 5 MG tablet Take 1 tablet (5 mg total) by mouth daily. Patient not taking: Reported on 09/22/2020 06/24/20   Arfeen, Phillips Grout, MD  hydrOXYzine (VISTARIL) 25 MG capsule Take 1 capsule (25 mg total) by mouth daily as needed for anxiety. Patient not taking: Reported on 01/15/2020 07/16/19   Arfeen, Phillips Grout, MD  ibuprofen (ADVIL) 200 MG tablet Take 600 mg by mouth every 6 (six) hours as needed for moderate pain.    [provider]  lamoTRIgine (LAMICTAL) 200 MG tablet Take 1 tablet (200 mg total) by mouth daily. 10/31/21   Arfeen, Phillips Grout, MD  QUEtiapine (SEROQUEL) 200 MG tablet Take 1 tablet (200 mg total) by mouth at bedtime. 10/31/21   Arfeen, Phillips Grout, MD      Allergies    Anaprox [naproxen sodium]    Review of Systems   Review of Systems  Musculoskeletal:  Positive for arthralgias and myalgias.  Skin:  Negative for wound.  Neurological:  Negative for numbness.   Physical Exam Updated Vital Signs BP (!) 159/102   Pulse 81   Temp 98.1 F (36.7 C) (Oral)   Resp 18   Ht 5\' 8"  (1.727 m)   Wt 89.8 kg   SpO2 97%   BMI 30.11 kg/m  Physical Exam Vitals and nursing note reviewed.  Constitutional:      General: She is not in acute distress.     Appearance: She is well-developed.  HENT:     Head: Normocephalic and atraumatic.     Right Ear: External ear normal.     Left Ear: External ear normal.  Eyes:     General: No scleral icterus.       Right eye: No discharge.        Left eye: No discharge.     Conjunctiva/sclera: Conjunctivae normal.  Neck:     Trachea: No tracheal deviation.  Cardiovascular:     Rate and Rhythm: Normal rate.  Pulmonary:     Effort: Pulmonary effort is normal. No respiratory distress.     Breath sounds: No stridor.  Abdominal:     General: There is no distension.  Musculoskeletal:        General: Tenderness present. No swelling or deformity.     Cervical back: Neck supple.     Comments: Moderate TTP noted circumferentially about the left distal forearm.  Appears to be worst along the distal ulna.  Positive snuffbox tenderness.  Unable to assess range of motion of the wrist due to patient's pain.  Patient is able to wiggle the fingers of the hand without difficulty.  Distal sensation intact.  Good cap refill.  2+ radial pulse.  Full range of  motion of the left elbow and shoulder.  No palpable tenderness in the left elbow and shoulder.  Skin:    General: Skin is warm and dry.     Findings: No rash.  Neurological:     Mental Status: She is alert.     Cranial Nerves: Cranial nerve deficit: no gross deficits.   ED Results / Procedures / Treatments   Labs (all labs ordered are listed, but only abnormal results are displayed) Labs Reviewed - No data to display  EKG None  Radiology DG Forearm Left  Result Date: 11/08/2021 CLINICAL DATA:  Fall with pain EXAM: LEFT FOREARM - 2 VIEW COMPARISON:  None Available. FINDINGS: There is no evidence of fracture or other focal bone lesions. Soft tissues are unremarkable. IMPRESSION: Negative. Electronically Signed   By: Jasmine Pang M.D.   On: 11/08/2021 17:20   DG Hand Complete Left  Result Date: 11/08/2021 CLINICAL DATA:  Fall with wrist pain EXAM: LEFT  HAND - COMPLETE 3+ VIEW COMPARISON:  None Available. FINDINGS: No fracture or malalignment. Joint space narrowing and degenerative change at the D IP and PIP joints and first CMC joint. IMPRESSION: No acute osseous abnormality Electronically Signed   By: Jasmine Pang M.D.   On: 11/08/2021 17:20    Procedures Procedures   Medications Ordered in ED Medications  HYDROcodone-acetaminophen (NORCO/VICODIN) 5-325 MG per tablet 1 tablet (1 tablet Oral Given 11/08/21 1708)   ED Course/ Medical Decision Making/ A&P                           Medical Decision Making Amount and/or Complexity of Data Reviewed Radiology: ordered.  Risk Prescription drug management.  Pt is a 57 y.o. female who presents to the emergency department due to a fall that occurred prior to arrival.  She tripped and fell on her left arm and reports pain to the forearm, wrist, and hand.  Imaging: X-ray of the left forearm is negative. X-ray of the left hand shows no acute osseous abnormality.  I, Placido Sou, PA-C, personally reviewed and evaluated these images and lab results as part of my medical decision-making.  On my exam patient has moderate tenderness along the distal left forearm.  Appears to be worse in the ulnar region but patient also has positive snuffbox tenderness.  Appears to be neurovascularly intact distal to the region.  I obtained x-rays of the left forearm and hand which are negative.  We will place patient in a thumb spica for comfort.  Patient appears stable for discharge at this time and she is agreeable.  She was given a referral to hand surgery if she finds that her symptoms are refractory.  Recommended continued use of Tylenol as well as Motrin.  We discussed dosing.  She has a ride home.  Given that she does have snuffbox tenderness, recommended that she return to the emergency department in 7 to 10 days if she finds that her pain has persisted overlying the scaphoid.  She verbalized understanding.   Her questions were answered and she was amicable at the time of discharge.    Note: Portions of this report may have been transcribed using voice recognition software. Every effort was made to ensure accuracy; however, inadvertent computerized transcription errors may be present.   Final Clinical Impression(s) / ED Diagnoses Final diagnoses:  Left hand pain  Left wrist pain  Left forearm pain   Rx / DC Orders ED Discharge Orders  None         Placido SouJoldersma, Maksym Pfiffner, PA-C 11/08/21 1746    Bethann BerkshireZammit, Joseph, MD 11/11/21 1105

## 2021-11-08 NOTE — Discharge Instructions (Addendum)
I recommend a combination of tylenol and ibuprofen for management of your pain. You can take a low dose of both at the same time. I recommend 500 mg of Tylenol combined with 600 mg of ibuprofen. This is one maximum strength Tylenol and three regular ibuprofen. You can take these 2-3 times for day for your pain. Please try to take these medications with a small amount of food as well to prevent upsetting your stomach.  Also, please consider topical pain relieving creams such as Voltaran Gel, BioFreeze, or Icy Hot. There is also a pain relieving cream made by [employer]. You should be able to find all of these at your local pharmacy.   Please continue to wear the wrist splint you were given as needed for comfort.  I have given you referral to Dr. Melvyn Novas with hand surgery.  Please follow-up with his team if you find that your symptoms have persisted after 1 to 2 weeks.  We also found that you are tender over the scaphoid bone on your wrist.  This is the region just beneath your thumb on the wrist.  If you find that you are still tender there you will need to return in about 7 to 10 days for repeat imaging of the wrist.  If you develop any new or worsening symptoms please come back to the emergency department.

## 2021-11-08 NOTE — ED Triage Notes (Signed)
Pt c/o left wrist pain after falling today. Pt has some swelling to left wrist.

## 2021-11-08 NOTE — Progress Notes (Signed)
Orthopedic Tech Progress Note Patient Details:  DAJHA URQUILLA 1965-07-12 [id number]  Ortho Devices Type of Ortho Device: Thumb velcro splint Ortho Device/Splint Location: Left wrist/thumb Ortho Device/Splint Interventions: Application   Post Interventions Patient Tolerated: Well Instructions Provided: Adjustment of device  Jansel Vonstein E Jaquesha Boroff 11/08/2021, 6:20 PM

## 2021-11-22 ENCOUNTER — Emergency Department (HOSPITAL_COMMUNITY)
Admission: EM | Admit: 2021-11-22 | Discharge: 2021-11-22 | Disposition: A | Discharge status: Home / Self Care | Payer: Self-pay | Attending: Emergency Medicine | Admitting: Emergency Medicine

## 2021-11-22 ENCOUNTER — Other Ambulatory Visit: Payer: Self-pay

## 2021-11-22 ENCOUNTER — Encounter (HOSPITAL_COMMUNITY): Payer: Self-pay

## 2021-11-22 ENCOUNTER — Emergency Department (HOSPITAL_COMMUNITY): Payer: Self-pay

## 2021-11-22 DIAGNOSIS — M5440 Lumbago with sciatica, unspecified side: Secondary | ICD-10-CM | POA: Insufficient documentation

## 2021-11-22 DIAGNOSIS — M545 Low back pain, unspecified: Secondary | ICD-10-CM

## 2021-11-22 LAB — I-STAT BETA HCG BLOOD, ED (MC, WL, AP ONLY): I-stat hCG, quantitative: <5 m[IU]/mL (ref <5)

## 2021-11-22 MED ORDER — OXYCODONE-ACETAMINOPHEN 5-325 MG PO TABS
1.0000 | ORAL_TABLET | Freq: Once | ORAL | Status: AC
  Administered 2021-11-22: 1 via ORAL
  Filled 2021-11-22: qty 1

## 2021-11-22 MED ORDER — METHOCARBAMOL 500 MG PO TABS: 500.0000 mg | ORAL_TABLET | Freq: Two times a day (BID) | ORAL | 0 refills | Status: DC

## 2021-11-22 MED ORDER — PREDNISONE 10 MG (21) PO TBPK: ORAL_TABLET | Freq: Every day | ORAL | 0 refills | Status: DC

## 2021-11-22 NOTE — ED Notes (Signed)
Pt verbalized discharge understanding. Pt ambulatory with assistance to wheelchair.

## 2021-11-22 NOTE — Discharge Instructions (Addendum)
You were seen today for severe low back pain.  Imaging shows no acute fracture.  This is likely an aggravation of your underlying disc disease.  I have prescribed a muscle relaxer and a steroid Dosepak.  The Dosepak will hopefully reduce inflammation.  I also considered prescribing anti-inflammatory, meloxicam, but due to your previous severe reaction to naproxen I did not prescribe that at this time.  You need an orthopedic surgeon/neurosurgeon for long-term management of this problem.  Please follow-up with 1 as insurance allows

## 2021-11-22 NOTE — ED Provider Notes (Signed)
[facility] Note   CSN: [id number] Arrival date & time: 11/22/21  1446     History  Chief Complaint  Patient presents with   Back Pain    Stefanie Galvan is a 56 y.o. female.  Patient presents to the hospital complaining of low back pain.  Patient states she fell a few days ago and today was trying to move a couch when she had excruciating pain in her lumbar spine.  The patient states she thinks she may have a history of degenerative changes in the lower back.  Patient denies urinary incontinence, denies saddle anesthesia.  Past medical history significant for bipolar 2 disorder, posttraumatic stress disorder, bipolar 1 disorder, arthritis, degenerative disc disease of lumbar region, generalized anxiety disorder, diverticulosis of sigmoid colon  HPI     Home Medications Prior to Admission medications   Medication Sig Start Date End Date Taking? Authorizing Provider  methocarbamol (ROBAXIN) 500 MG tablet Take 1 tablet (500 mg total) by mouth 2 (two) times daily. 11/22/21  Yes Darrick Grinder, PA-C  predniSONE (STERAPRED UNI-PAK 21 TAB) 10 MG (21) TBPK tablet Take by mouth daily. Take 6 tabs by mouth daily  for 2 days, then 5 tabs for 2 days, then 4 tabs for 2 days, then 3 tabs for 2 days, 2 tabs for 2 days, then 1 tab by mouth daily for 2 days 11/22/21  Yes Marcelline Deist, Dorisann Frames, PA-C  escitalopram (LEXAPRO) 5 MG tablet Take 1 tablet (5 mg total) by mouth daily. Patient not taking: Reported on 09/22/2020 06/24/20   Arfeen, Phillips Grout, MD  hydrOXYzine (VISTARIL) 25 MG capsule Take 1 capsule (25 mg total) by mouth daily as needed for anxiety. Patient not taking: Reported on 01/15/2020 07/16/19   Arfeen, Phillips Grout, MD  ibuprofen (ADVIL) 200 MG tablet Take 600 mg by mouth every 6 (six) hours as needed for moderate pain.    [provider]  lamoTRIgine (LAMICTAL) 200 MG tablet Take 1 tablet (200 mg total) by mouth daily. 10/31/21   Arfeen, Phillips Grout, MD   QUEtiapine (SEROQUEL) 200 MG tablet Take 1 tablet (200 mg total) by mouth at bedtime. 10/31/21   Arfeen, Phillips Grout, MD      Allergies    Anaprox [naproxen sodium]    Review of Systems   Review of Systems  Constitutional:  Negative for fever.  Respiratory:  Negative for shortness of breath.   Cardiovascular:  Negative for chest pain.  Gastrointestinal:  Negative for abdominal pain and nausea.  Musculoskeletal:  Positive for back pain.   Physical Exam Updated Vital Signs BP (!) 173/115 (BP Location: Right Arm)   Pulse 100   Temp 97.7 F (36.5 C) (Oral)   Resp 20   SpO2 93%  Physical Exam Vitals and nursing note reviewed.  Constitutional:      General: She is in acute distress.  HENT:     Head: Normocephalic and atraumatic.  Eyes:     Conjunctiva/sclera: Conjunctivae normal.  Cardiovascular:     Rate and Rhythm: Normal rate and regular rhythm.     Pulses: Normal pulses.  Pulmonary:     Effort: Pulmonary effort is normal.     Breath sounds: Normal breath sounds.  Abdominal:     Palpations: Abdomen is soft.  Musculoskeletal:        General: Tenderness present.     Cervical back: Normal range of motion and neck supple.     Comments: Tenderness to palpation  lumbar region bilaterally and sacrum  Neurological:     Mental Status: She is alert.     Comments: Distal sensation intact, bilateral normal EHL strength, normal Achilles reflexes bilaterally, strong pedal pulses bilaterally    ED Results / Procedures / Treatments   Labs (all labs ordered are listed, but only abnormal results are displayed) Labs Reviewed  I-STAT BETA HCG BLOOD, ED ([facility], [facility], [facility] ONLY)    EKG None  Radiology DG Lumbar Spine Complete  Result Date: 11/22/2021 CLINICAL DATA:  Severe back pain, recent fall EXAM: LUMBAR SPINE - COMPLETE 4+ VIEW COMPARISON:  09/03/2002 FINDINGS: No recent fracture is seen. Alignment of posterior margins of vertebral bodies is unremarkable. Degenerative changes are noted  with disc space narrowing, bony spurs and facet hypertrophy at L4-L5 and L5-S1 levels. IMPRESSION: No recent fracture is seen in the lumbar spine. Significant degenerative changes are noted at L4-L5 and L5-S1 levels with disc space narrowing, bony spurs and facet hypertrophy. Electronically Signed   By: Ernie Avena M.D.   On: 11/22/2021 17:26    Procedures Procedures    Medications Ordered in ED Medications  oxyCODONE-acetaminophen (PERCOCET/ROXICET) 5-325 MG per tablet 1 tablet (1 tablet Oral Given 11/22/21 1542)    ED Course/ Medical Decision Making/ A&P                           Medical Decision Making Amount and/or Complexity of Data Reviewed Radiology: ordered.  Risk Prescription drug management.   The patient presents with a chief complaint of low back pain.  Differential includes but is not limited to fracture, dislocation, disc disease, and others  I ordered and interpreted imaging including plain films of the lumbar spine.No recent fracture is seen in the lumbar spine. Significant degenerative changes are noted at L4-L5 and L5-S1 levels with disc space narrowing, bony spurs and facet hypertrophy.  I agree with radiologist imaging  I ordered oxycodone for pain. The patient also had fentanyl prior to arrival. Upon reassessment the pain had improved.  I reviewed outside records including emergency notes documenting recent injuries and outpatient psychiatric notes  There was no fracture, dislocation on imaging.  This is likely a severe exacerbation of underlying degenerative disc disease.  The patient needs to see orthopedics/neurosurgery for further evaluation and management.  I will send the patient home with a prescription for meloxicam, methocarbamol, and a steroid taper.  Long term the patient needs to see outpatient providers for care        Final Clinical Impression(s) / ED Diagnoses Final diagnoses:  Acute bilateral low back pain, unspecified whether  sciatica present  Midline low back pain, unspecified chronicity, unspecified whether sciatica present    Rx / DC Orders ED Discharge Orders          Ordered    predniSONE (STERAPRED UNI-PAK 21 TAB) 10 MG (21) TBPK tablet  Daily        11/22/21 1748    methocarbamol (ROBAXIN) 500 MG tablet  2 times daily        11/22/21 1748              Pamala Duffel 11/22/21 1748    Bethann Berkshire, MD 11/22/21 2246

## 2021-11-22 NOTE — ED Triage Notes (Signed)
Pt BIB EMS from home. Pt has hx of degenerative discs and endorses back pain after lifting a couch.   Fentanyl  18G LAC  160/100 HR 80 98% RA

## 2022-01-30 ENCOUNTER — Telehealth (HOSPITAL_BASED_OUTPATIENT_CLINIC_OR_DEPARTMENT_OTHER): Payer: Self-pay | Admitting: Psychiatry

## 2022-01-30 ENCOUNTER — Encounter (HOSPITAL_COMMUNITY): Payer: Self-pay | Admitting: Psychiatry

## 2022-01-30 DIAGNOSIS — F319 Bipolar disorder, unspecified: Secondary | ICD-10-CM

## 2022-01-30 MED ORDER — LAMOTRIGINE 200 MG PO TABS: 200.0000 mg | ORAL_TABLET | Freq: Every day | ORAL | 0 refills | Status: DC

## 2022-01-30 MED ORDER — QUETIAPINE FUMARATE 200 MG PO TABS: 200.0000 mg | ORAL_TABLET | Freq: Every day | ORAL | 0 refills | Status: DC

## 2022-01-30 NOTE — Progress Notes (Signed)
Virtual Visit via Telephone Note  I connected with Stefanie Galvan on 01/30/22 at  3:20 PM EDT by telephone and verified that I am speaking with the correct person using two identifiers.  Location: Patient: Home Provider: Home Office   I discussed the limitations, risks, security and privacy concerns of performing an evaluation and management service by telephone and the availability of in person appointments. I also discussed with the patient that there may be a patient responsible charge related to this service. The patient expressed understanding and agreed to proceed.   History of Present Illness: Patient is evaluated by phone session.  She excited because she find a place and now moving end of this month.  Patient told the place she is living is not getting better and finally she is able to find a place to rent out.  Her job is going well.  She excited about upcoming plan to visit [place] with her friend.  Patient is still in touch with her mother but tried to keep her distance.  Patient reported there are some occasions when she feels more energy and start cleaning the house but denies any mania, excessive spending or buying.  She is actually very cautious about her finances and making a better decision for her life.  Her appetite is okay.  Her weight is stable.  Now she like to watch her calorie intake and better choices to eat food.  She has no tremors, shakes or any EPS.  She wants to keep the current medication.  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  Patient told she is making bonuses and that is helping her a lot financially.  She is sleeping good but still has occasionally nightmares.    Past Psychiatric History: Reviewed H/O bipolar, PTSD and poor impulse control with excessive talking and spending. H/O inpatient [facility] and [facility]. Last inpatient in July 2020 at [facility] and than IOP. H/O overdose on lithium, trazodone, Lexapro and Minipress.  Took Seroquel, Zoloft, Cymbalta, Paxil, Prozac, Ativan, Abilify, Wellbutrin, Minipress and lithium.    Psychiatric Specialty Exam: Physical Exam  Review of Systems  Weight 198 lb (89.8 kg).There is no height or weight on file to calculate BMI.  General Appearance: NA  Eye Contact:  NA  Speech:  Normal Rate  Volume:  Normal  Mood:  Euthymic  Affect:  NA  Thought Process:  Goal Directed  Orientation:  Full (Time, Place, and Person)  Thought Content:  WDL  Suicidal Thoughts:  No  Homicidal Thoughts:  No  Memory:  Immediate;   Good Recent;   Good Remote;   Good  Judgement:  Intact  Insight:  Present  Psychomotor Activity:  NA  Concentration:  Concentration: Good and Attention Span: Good  Recall:  Good  Fund of Knowledge:  Good  Language:  Good  Akathisia:  No  Handed:  Right  AIMS (if indicated):     Assets:  Communication Skills Desire for Improvement Housing Talents/Skills Transportation  ADL's:  Intact  Cognition:  WNL  Sleep:   Okay.  Occasional nightmares.      Assessment and Plan: Bipolar disorder type I.  PTSD.  Patient is stable on her current medication.  Discussed bouts of increased energy but patient reported they are chronic and manageable.  She denied any mania.  She like to keep the current medicine which is Seroquel 200 mg at bedtime and Lamictal 200 mg daily.  She has no rash, itching  tremors or shakes.  Recommended to call us back if she is any question or any concern.  Follow-up in 3 months.  Follow Up Instructions:    I discussed the assessment and treatment plan with the patient. The patient was provided an opportunity to ask questions and all were answered. The patient agreed with the plan and demonstrated an understanding of the instructions.   The patient was advised to call back or seek an in-person evaluation if the symptoms worsen or if the condition fails to improve as anticipated.  Collaboration of Care: Primary Care Provider AEB notes are  available in epic to review.  Patient/Guardian was advised Release of Information must be obtained prior to any record release in order to collaborate their care with an outside provider. Patient/Guardian was advised if they have not already done so to contact the registration department to sign all necessary forms in order for [place] to release information regarding their care.   Consent: Patient/Guardian gives verbal consent for treatment and assignment of benefits for services provided during this visit. Patient/Guardian expressed understanding and agreed to proceed.    I provided 15 minutes of non-face-to-face time during this encounter.   Cleotis Nipper, MD

## 2022-05-01 ENCOUNTER — Telehealth (HOSPITAL_COMMUNITY): Payer: Self-pay | Admitting: Psychiatry

## 2022-05-04 ENCOUNTER — Telehealth (HOSPITAL_BASED_OUTPATIENT_CLINIC_OR_DEPARTMENT_OTHER): Payer: Self-pay | Admitting: Psychiatry

## 2022-05-04 ENCOUNTER — Encounter (HOSPITAL_COMMUNITY): Payer: Self-pay | Admitting: Psychiatry

## 2022-05-04 DIAGNOSIS — F319 Bipolar disorder, unspecified: Secondary | ICD-10-CM

## 2022-05-04 MED ORDER — QUETIAPINE FUMARATE 200 MG PO TABS: 200.0000 mg | ORAL_TABLET | Freq: Every day | ORAL | 0 refills | Status: DC

## 2022-05-04 MED ORDER — LAMOTRIGINE 200 MG PO TABS: 200.0000 mg | ORAL_TABLET | Freq: Every day | ORAL | 0 refills | Status: DC

## 2022-05-04 NOTE — Progress Notes (Signed)
Virtual Visit via Telephone Note  I connected with Stefanie Galvan on 05/04/22 at  3:20 PM EST by telephone and verified that I am speaking with the correct person using two identifiers.  Location: Patient: In Car Provider: Home Office   I discussed the limitations, risks, security and privacy concerns of performing an evaluation and management service by telephone and the availability of in person appointments. I also discussed with the patient that there may be a patient responsible charge related to this service. The patient expressed understanding and agreed to proceed.   History of Present Illness: Patient is evaluated by phone session.  She is doing very well and started that recently dating with a guy and she is very happy that things are going very well.  It is almost 2 months.  They have vacation together and she really enjoyed the time.  She also happy because this time she is not going to have a Thanksgiving with her mother as she recall last year was too much stress.  She also happy because her son called that she is going to be grandma in April.  She thinks things are going very well in her life and she denies any mania, psychosis or any hallucination.  She is not overspending or buying.  She is recently moved in [place].  She liked the new place.  Her job sometimes challenging but manageable.  Her plan this Thanksgiving is to spend time with her daughter and her fianc, herself and her boyfriend.  She also wanted to have a lunch with her boyfriend's family.  She denies any nightmares or flashback.  She is sleeping good.  She like to keep the current medication.  She has no tremors, shakes or any EPS.   Past Psychiatric History: Reviewed H/O bipolar, PTSD and poor impulse control with excessive talking and spending. H/O inpatient [facility], [facility] and [facility]. H/O overdose on lithium, trazodone, Lexapro and Minipress. Took  Seroquel, Zoloft, Cymbalta, Paxil, Prozac, Ativan, Abilify, Wellbutrin, Minipress and lithium.    Psychiatric Specialty Exam: Physical Exam  Review of Systems  Weight 193 lb (87.5 kg).There is no height or weight on file to calculate BMI.  General Appearance: NA  Eye Contact:  NA  Speech:  Clear and Coherent and Normal Rate  Volume:  Normal  Mood:  Euthymic  Affect:  NA  Thought Process:  Goal Directed  Orientation:  Full (Time, Place, and Person)  Thought Content:  Logical  Suicidal Thoughts:  No  Homicidal Thoughts:  No  Memory:  Immediate;   Good Recent;   Good Remote;   Good  Judgement:  Good  Insight:  Present  Psychomotor Activity:  NA  Concentration:  Concentration: Good and Attention Span: Good  Recall:  Good  Fund of Knowledge:  Good  Language:  Good  Akathisia:  No  Handed:  Right  AIMS (if indicated):     Assets:  Communication Skills Desire for Improvement Housing Resilience Social Support Talents/Skills Transportation  ADL's:  Intact  Cognition:  WNL  Sleep:   ok      Assessment and Plan: Bipolar disorder type I.  PTSD.  Patient doing well and stable since started the new relationship.  Discussed current medication.  Patient has no side effects.  She has no mania.  Continue Seroquel 200 mg at bedtime and Lamictal 200 mg daily.  Recommended to call us back if she has any question,  concern or if she feels worsening of the symptoms.  Follow-up in 3 months.  Follow Up Instructions:    I discussed the assessment and treatment plan with the patient. The patient was provided an opportunity to ask questions and all were answered. The patient agreed with the plan and demonstrated an understanding of the instructions.   The patient was advised to call back or seek an in-person evaluation if the symptoms worsen or if the condition fails to improve as anticipated.  Collaboration of Care: Other provider involved in patient's care AEB notes are in epic to  review.  Patient/Guardian was advised Release of Information must be obtained prior to any record release in order to collaborate their care with an outside provider. Patient/Guardian was advised if they have not already done so to contact the registration department to sign all necessary forms in order for [place] to release information regarding their care.   Consent: Patient/Guardian gives verbal consent for treatment and assignment of benefits for services provided during this visit. Patient/Guardian expressed understanding and agreed to proceed.    I provided 20 minutes of non-face-to-face time during this encounter.   Cleotis Nipper, MD

## 2022-07-26 ENCOUNTER — Telehealth (HOSPITAL_BASED_OUTPATIENT_CLINIC_OR_DEPARTMENT_OTHER): Payer: Self-pay | Admitting: Psychiatry

## 2022-07-26 ENCOUNTER — Encounter (HOSPITAL_COMMUNITY): Payer: Self-pay | Admitting: Psychiatry

## 2022-07-26 VITALS — Wt 190.0 lb

## 2022-07-26 DIAGNOSIS — F4310 Post-traumatic stress disorder, unspecified: Secondary | ICD-10-CM

## 2022-07-26 DIAGNOSIS — F319 Bipolar disorder, unspecified: Secondary | ICD-10-CM

## 2022-07-26 MED ORDER — LAMOTRIGINE 200 MG PO TABS: 200.0000 mg | ORAL_TABLET | Freq: Every day | ORAL | 0 refills | Status: DC

## 2022-07-26 MED ORDER — QUETIAPINE FUMARATE 25 MG PO TABS: 25.0000 mg | ORAL_TABLET | Freq: Two times a day (BID) | ORAL | 0 refills | Status: DC

## 2022-07-26 MED ORDER — QUETIAPINE FUMARATE 200 MG PO TABS: 200.0000 mg | ORAL_TABLET | Freq: Every day | ORAL | 0 refills | Status: DC
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
## 2022-08-08 ENCOUNTER — Telehealth (HOSPITAL_COMMUNITY): Payer: Self-pay | Admitting: Psychiatry

## 2022-08-13 ENCOUNTER — Ambulatory Visit (HOSPITAL_COMMUNITY): Payer: Self-pay | Admitting: Clinical

## 2022-08-24 ENCOUNTER — Telehealth (HOSPITAL_BASED_OUTPATIENT_CLINIC_OR_DEPARTMENT_OTHER): Payer: Self-pay | Admitting: Psychiatry

## 2022-08-24 ENCOUNTER — Encounter (HOSPITAL_COMMUNITY): Payer: Self-pay | Admitting: Psychiatry

## 2022-08-24 DIAGNOSIS — F4310 Post-traumatic stress disorder, unspecified: Secondary | ICD-10-CM

## 2022-08-24 DIAGNOSIS — F319 Bipolar disorder, unspecified: Secondary | ICD-10-CM

## 2022-08-24 MED ORDER — QUETIAPINE FUMARATE 200 MG PO TABS: 200.0000 mg | ORAL_TABLET | Freq: Every day | ORAL | 0 refills | Status: DC

## 2022-08-24 MED ORDER — QUETIAPINE FUMARATE 25 MG PO TABS: 25.0000 mg | ORAL_TABLET | Freq: Two times a day (BID) | ORAL | 1 refills | Status: DC

## 2022-08-24 MED ORDER — LAMOTRIGINE 200 MG PO TABS: 200.0000 mg | ORAL_TABLET | Freq: Every day | ORAL | 0 refills | Status: DC

## 2022-08-24 NOTE — Progress Notes (Signed)
[facility] MD Virtual Progress Note   Patient Location: Home Provider Location: Home Office  I connect with patient by telephone and verified that I am speaking with correct person by using two identifiers. I discussed the limitations of evaluation and management by telemedicine and the availability of in person appointments. I also discussed with the patient that there may be a patient responsible charge related to this service. The patient expressed understanding and agreed to proceed.  Stefanie Galvan [id number] 57 y.o.  08/24/2022 8:42 AM  History of Present Illness:  Patient is evaluated by phone session.  She is doing better with the addition of extra Seroquel.  She is taking 25 mg 2 times a day.  She feels her manic-like symptoms are much better but is still struggle with sleep and nightmares.  She started doing shag dancing with her partner and also started doing BB shooting with her middle son to help her anger, irritability and anxiety.  She feels very enjoyable and like to continue and trying to be more active in her daily life.  She admitted job started to getting more challenging as he people left and she has more territory to cover and there are days when she has to stay away from home for 12 hours.  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  She denies any anger, mania, psychosis, crying spells or any feeling of hopelessness or worthlessness.  She also started therapy with Arbie Cookey at [facility] and started journaling her thinking.  She feel all those things are helpful and she does not think as much about her past.  Her appetite is okay.  Her weight is stable but she like to discuss with her PCP to discuss weight loss medication.  She has no tremors, shakes or any EPS.  She has no rash or any itching.  She lives by herself and enjoyed the company of her dog.  Past Psychiatric History: H/O bipolar, PTSD and poor impulse control with  excessive talking and spending. H/O inpatient [place] [facility], [place] and [facility]. Last inpatient in July 2020 at [facility] and than IOP. H/O overdose on lithium, trazodone, Ambien,Lexapro and Minipress. Took Seroquel, Zoloft, Cymbalta, Paxil, Prozac, Ativan, Abilify, Wellbutrin and vistaril.       Outpatient Encounter Medications as of 08/24/2022  Medication Sig   hydrOXYzine (VISTARIL) 25 MG capsule Take 1 capsule (25 mg total) by mouth daily as needed for anxiety. (Patient not taking: Reported on 01/15/2020)   ibuprofen (ADVIL) 200 MG tablet Take 600 mg by mouth every 6 (six) hours as needed for moderate pain.   lamoTRIgine (LAMICTAL) 200 MG tablet Take 1 tablet (200 mg total) by mouth daily.   QUEtiapine (SEROQUEL) 200 MG tablet Take 1 tablet (200 mg total) by mouth at bedtime.   QUEtiapine (SEROQUEL) 25 MG tablet Take 1-2 tablets (25-50 mg total) by mouth 2 (two) times daily.   No facility-administered encounter medications on file as of 08/24/2022.    No results found for this or any previous visit (from the past 2160 hour(s)).   Psychiatric Specialty Exam: Physical Exam  Review of Systems  Weight 190 lb (86.2 kg).There is no height or weight on file to calculate BMI.  General Appearance: NA  Eye Contact:  NA  Speech:  Clear and Coherent and Normal Rate  Volume:  Normal  Mood:  Anxious  Affect:  NA  Thought Process:  Goal Directed  Orientation:  Full (Time, Place,  and Person)  Thought Content:  Rumination  Suicidal Thoughts:  No  Homicidal Thoughts:  No  Memory:  Immediate;   Good Recent;   Good Remote;   Good  Judgement:  Intact  Insight:  Present  Psychomotor Activity:  NA  Concentration:  Concentration: Good and Attention Span: Good  Recall:  Good  Fund of Knowledge:  Good  Language:  Good  Akathisia:  No  Handed:  Right  AIMS (if indicated):     Assets:  Communication Skills Desire for Improvement Housing Talents/Skills Transportation  ADL's:  Intact  Cognition:   WNL  Sleep:  still nightmares     Assessment/Plan: Bipolar 1 disorder, depressed ([place]) - Plan: lamoTRIgine (LAMICTAL) 200 MG tablet, QUEtiapine (SEROQUEL) 200 MG tablet, QUEtiapine (SEROQUEL) 25 MG tablet  PTSD (post-traumatic stress disorder) - Plan: QUEtiapine (SEROQUEL) 200 MG tablet, QUEtiapine (SEROQUEL) 25 MG tablet  Patient doing better with additional Seroquel.  She started multiple things to keep herself busy.  I encourage make appointment with PCP to do physical, blood work and also she can consider weight loss program if needed.  We talk about still insomnia and if above activities does not help her sleep along with therapy then we may consider referral to sleep studies.  Patient agreed with the plan.  We decided not to change the medication.  Continue Seroquel 25 mg 2 times a day, Seroquel 200 mg at bedtime, Lamictal 200 mg daily.  Encourage to keep therapy appointment with Arbie Cookey at [facility].  Recommend to call us back if she has any question or any concern.  Follow-up in 2 months.   Follow Up Instructions:     I discussed the assessment and treatment plan with the patient. The patient was provided an opportunity to ask questions and all were answered. The patient agreed with the plan and demonstrated an understanding of the instructions.   The patient was advised to call back or seek an in-person evaluation if the symptoms worsen or if the condition fails to improve as anticipated.    Collaboration of Care: Other provider involved in patient's care AEB notes are available in epic to review.  Patient/Guardian was advised Release of Information must be obtained prior to any record release in order to collaborate their care with an outside provider. Patient/Guardian was advised if they have not already done so to contact the registration department to sign all necessary forms in order for [place] to release information regarding their care.   Consent: Patient/Guardian gives verbal  consent for treatment and assignment of benefits for services provided during this visit. Patient/Guardian expressed understanding and agreed to proceed.     I provided 25 minutes of non face to face time during this encounter.  Kathlee Nations, MD 08/24/2022

## 2022-10-26 ENCOUNTER — Encounter (HOSPITAL_COMMUNITY): Payer: Self-pay | Admitting: Psychiatry

## 2022-10-26 ENCOUNTER — Telehealth (HOSPITAL_BASED_OUTPATIENT_CLINIC_OR_DEPARTMENT_OTHER): Payer: Self-pay | Admitting: Psychiatry

## 2022-10-26 DIAGNOSIS — F4310 Post-traumatic stress disorder, unspecified: Secondary | ICD-10-CM

## 2022-10-26 DIAGNOSIS — F319 Bipolar disorder, unspecified: Secondary | ICD-10-CM

## 2022-10-26 MED ORDER — QUETIAPINE FUMARATE 25 MG PO TABS
25.0000 mg | ORAL_TABLET | Freq: Two times a day (BID) | ORAL | 2 refills | Status: DC
Start: 2022-10-26 — End: 2023-01-25

## 2022-10-26 MED ORDER — QUETIAPINE FUMARATE 200 MG PO TABS
200.0000 mg | ORAL_TABLET | Freq: Every day | ORAL | 0 refills | Status: DC
Start: 2022-10-26 — End: 2023-01-25

## 2022-10-26 MED ORDER — LAMOTRIGINE 200 MG PO TABS: 200.0000 mg | ORAL_TABLET | Freq: Every day | ORAL | 0 refills | Status: DC

## 2022-10-26 NOTE — Progress Notes (Signed)
[facility] MD Virtual Progress Note   Patient Location: Home Provider Location: Home Office  I connect with patient by telephone and verified that I am speaking with correct person by using two identifiers. I discussed the limitations of evaluation and management by telemedicine and the availability of in person appointments. I also discussed with the patient that there may be a patient responsible charge related to this service. The patient expressed understanding and agreed to proceed.  Stefanie Galvan [id number] 57 y.o.  10/26/2022 8:43 AM  History of Present Illness:  Patient is evaluated by phone session.  She is very happy as newly grandma as of last Monday.  Patient will her son had a baby and she was very happy to see the grandchild.  She reported things are going well with her.  She denies any mania, irritability, impulsive behavior.  She sleeps good.  She continues to do shag advance at least 2 times a week.  She also taking the lesson for dance.  She also decided as going to [facility] for 1 week with her best friend.  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  She liked the job but is flexible.  She denies any crying spells or any feeling of hopelessness or worthlessness.  She admitted not able to see Okey Regal at [facility] because of insurance reason but now insurance issue is resolved and she will like to go back to see therapy.  She admitted did not contact the primary care for blood work but promised to do it very soon.  She has no tremors or shakes or any EPS.  She denies any hallucination, paranoia or any suicidal thoughts.  Her appetite is okay.  Her weight is stable.  She lost a few pounds since the last visit and she is happy about it.  She wants to continue Lamictal, Seroquel which is working well for her.  She denies drinking or using any illegal substances.  She enjoyed the company of the dog.   Patient reported her mother is also doing well and there has been no recent incident and she is pleased with that.   Past Psychiatric History: H/O bipolar, PTSD and poor impulse control with excessive talking and spending. H/O inpatient [facility], [facility] and [facility]. Last inpatient in July 2020 at [facility] and than IOP. H/O overdose on lithium, trazodone, Ambien,Lexapro and Minipress. Took Seroquel, Zoloft, Cymbalta, Paxil, Prozac, Ativan, Abilify, Wellbutrin and vistaril.       Outpatient Encounter Medications as of 10/26/2022  Medication Sig   ibuprofen (ADVIL) 200 MG tablet Take 600 mg by mouth every 6 (six) hours as needed for moderate pain.   lamoTRIgine (LAMICTAL) 200 MG tablet Take 1 tablet (200 mg total) by mouth daily.   QUEtiapine (SEROQUEL) 200 MG tablet Take 1 tablet (200 mg total) by mouth at bedtime.   QUEtiapine (SEROQUEL) 25 MG tablet Take 1 tablet (25 mg total) by mouth 2 (two) times daily.   No facility-administered encounter medications on file as of 10/26/2022.    No results found for this or any previous visit (from the past 2160 hour(s)).   Psychiatric Specialty Exam: Physical Exam  Review of Systems  Constitutional:        No rash    Weight 188 lb (85.3 kg).There is no height or weight on file to calculate BMI.  General Appearance: NA  Eye Contact:  NA  Speech:  Clear and Coherent and Normal Rate  Volume:  Normal  Mood:  Euthymic  Affect:  NA  Thought Process:  Goal Directed  Orientation:  Full (Time, Place, and Person)  Thought Content:  Logical  Suicidal Thoughts:  No  Homicidal Thoughts:  No  Memory:  Immediate;   Good Recent;   Good Remote;   Good  Judgement:  Intact  Insight:  Present  Psychomotor Activity:  NA  Concentration:  Concentration: Good and Attention Span: Good  Recall:  Good  Fund of Knowledge:  Good  Language:  Good  Akathisia:  No  Handed:  Right  AIMS (if indicated):     Assets:  Communication Skills Desire for  Improvement Housing Resilience Social Support Talents/Skills Transportation  ADL's:  Intact  Cognition:  WNL  Sleep:  ok     Assessment/Plan: Bipolar 1 disorder, depressed (HCC) - Plan: lamoTRIgine (LAMICTAL) 200 MG tablet, QUEtiapine (SEROQUEL) 200 MG tablet, QUEtiapine (SEROQUEL) 25 MG tablet  PTSD (post-traumatic stress disorder) - Plan: QUEtiapine (SEROQUEL) 200 MG tablet, QUEtiapine (SEROQUEL) 25 MG tablet  Patient is stable on current medication.  Encourage blood work and contact primary care for physical and wellness check.  She has no rash or any itching.  Continue Seroquel 25 mg 2 times a day, continue Seroquel 200 mg at bedtime and Lamictal 200 mg daily.  Patient is going to reconnect with the therapist at [facility].  Patient has no concern with the current medication.  Recommend to call us back if she has any question or any concern.  Follow-up in 3 months.   Follow Up Instructions:     I discussed the assessment and treatment plan with the patient. The patient was provided an opportunity to ask questions and all were answered. The patient agreed with the plan and demonstrated an understanding of the instructions.   The patient was advised to call back or seek an in-person evaluation if the symptoms worsen or if the condition fails to improve as anticipated.    Collaboration of Care: Other provider involved in patient's care AEB notes are available in epic to review.  Patient/Guardian was advised Release of Information must be obtained prior to any record release in order to collaborate their care with an outside provider. Patient/Guardian was advised if they have not already done so to contact the registration department to sign all necessary forms in order for [place] to release information regarding their care.   Consent: Patient/Guardian gives verbal consent for treatment and assignment of benefits for services provided during this visit. Patient/Guardian expressed  understanding and agreed to proceed.     I provided 23 minutes of non face to face time during this encounter.  Note: This document was prepared by [employer] voice dictation technology and any errors that results from this process are unintentional.    Cleotis Nipper, MD 10/26/2022

## 2023-01-02 IMAGING — CR DG LUMBAR SPINE COMPLETE 4+V
6 series · 6 of 6 positions shown · non-contrast
Comparison: 09/03/2002

CLINICAL DATA: Severe back pain, recent fall

EXAM:
LUMBAR SPINE - COMPLETE 4+ VIEW

[t lumbar spine lat]
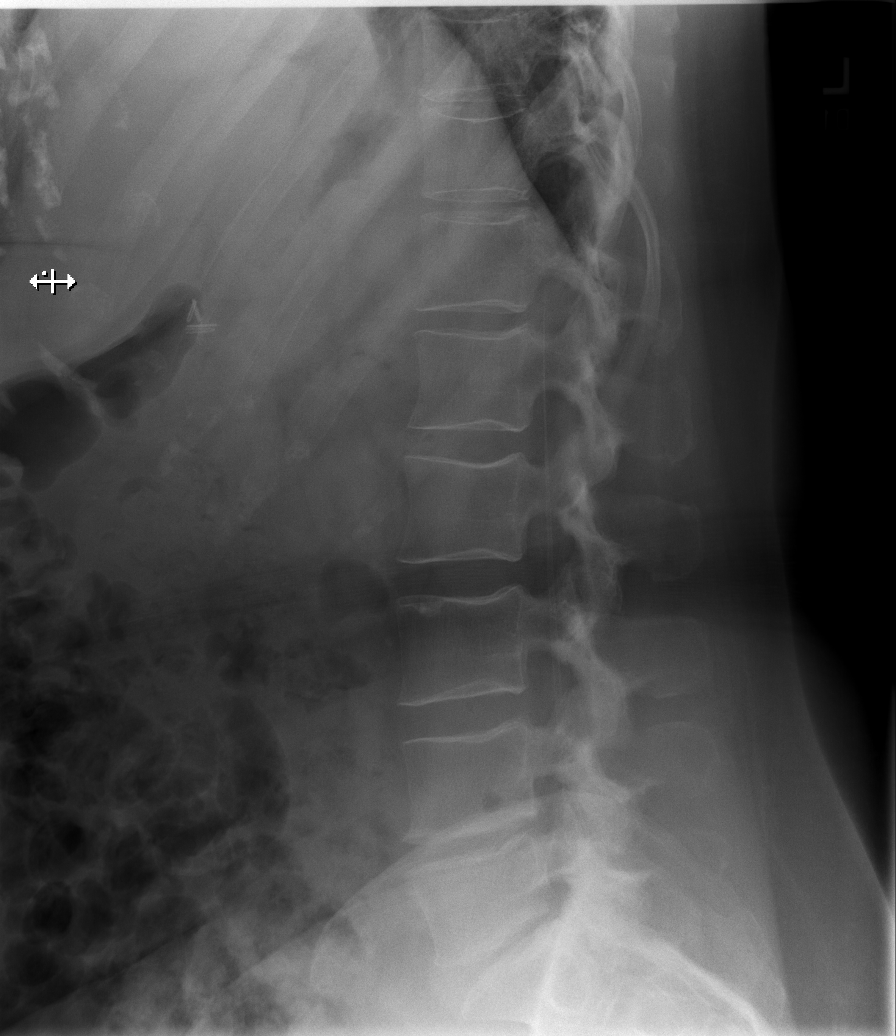

[t lumbar l-5 s-1 spot]
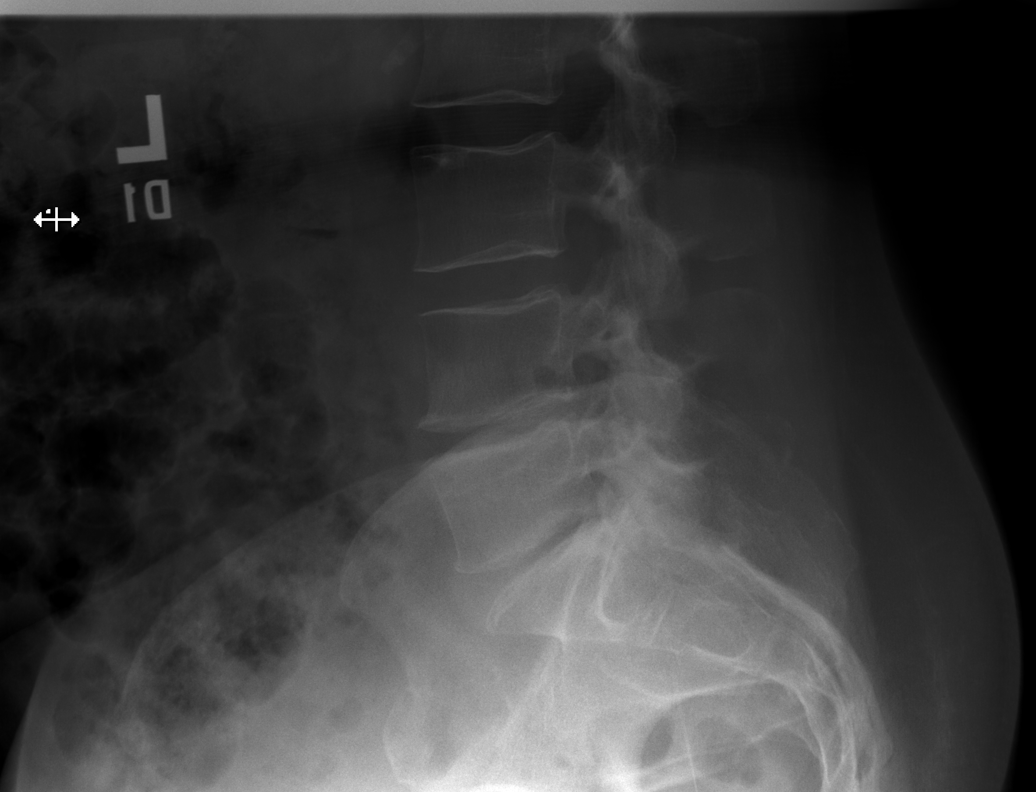

[t lumbar spine ap]
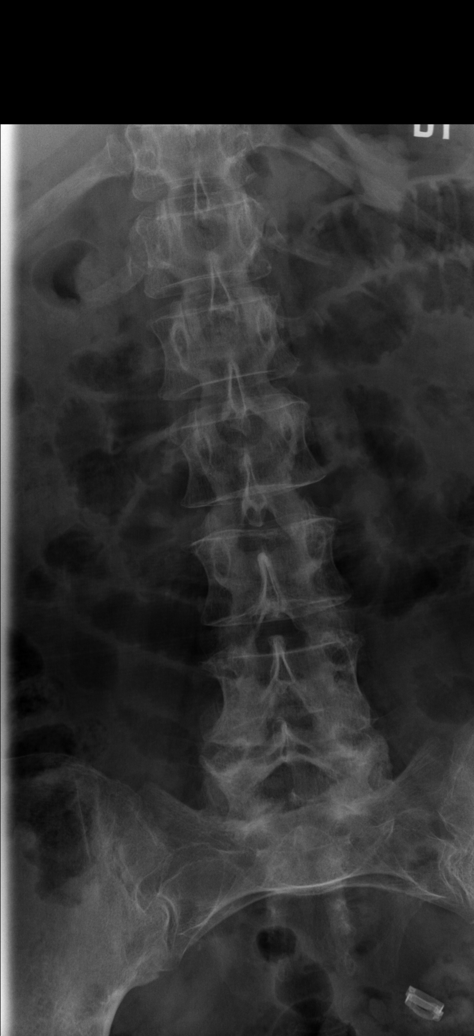

[t lumbar spine obl (1 of 3)]
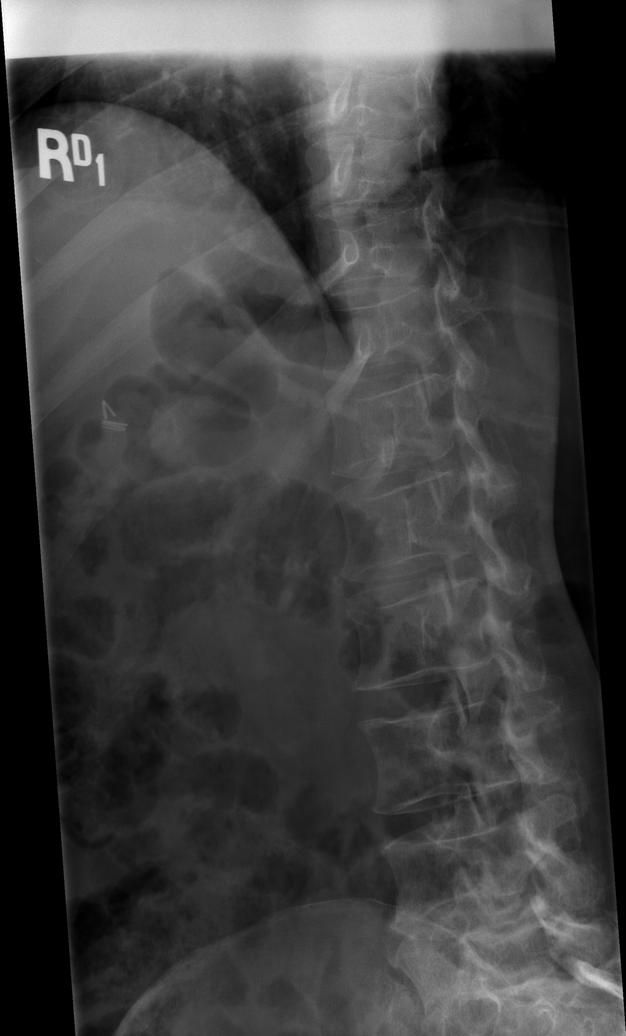

[t lumbar spine obl (2 of 3)]
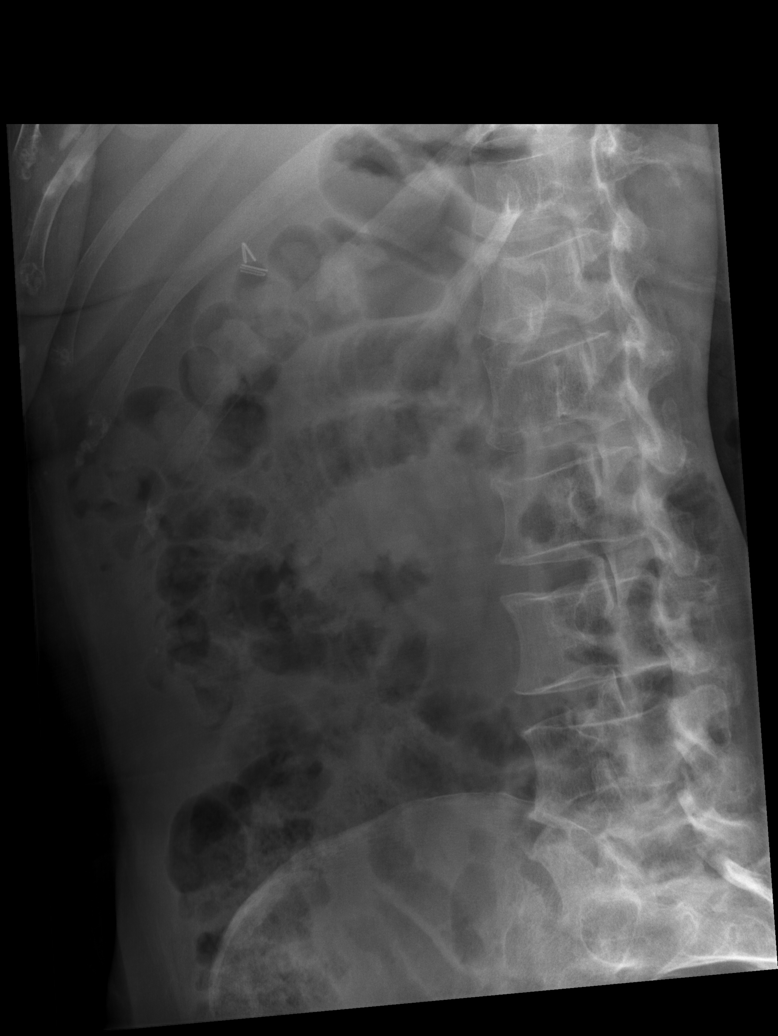

[t lumbar spine obl (3 of 3)]
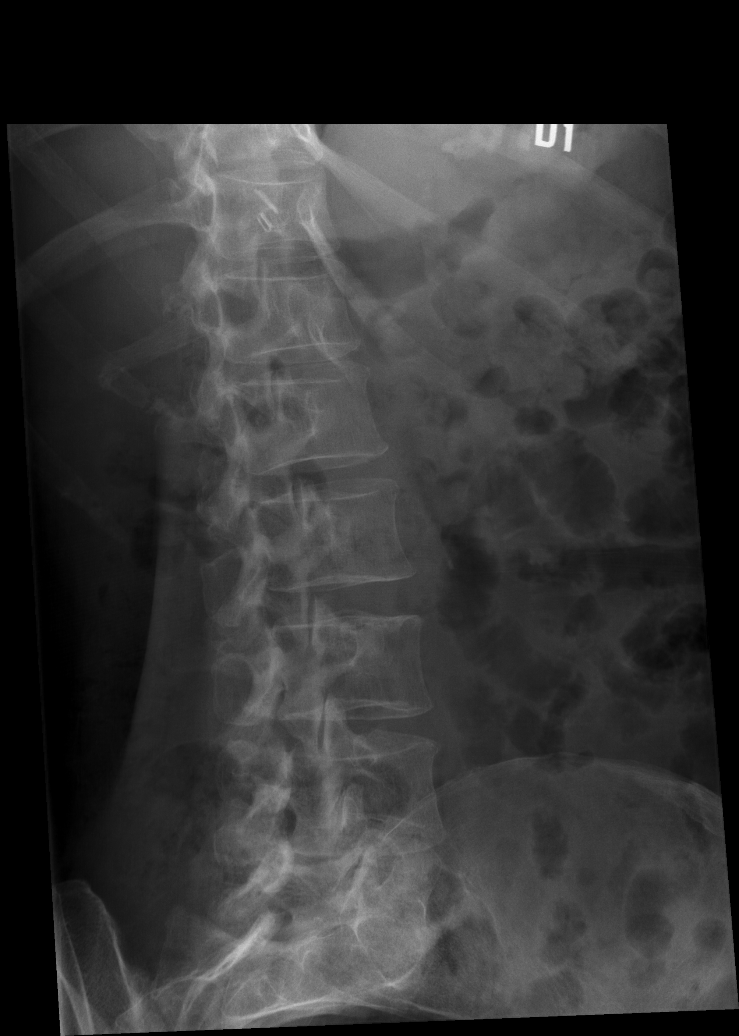

[6 of 6 positions shown; findings below may reference images not displayed]

FINDINGS: No recent fracture is seen. Alignment of posterior margins of
vertebral bodies is unremarkable. Degenerative changes are noted
with disc space narrowing, bony spurs and facet hypertrophy at L4-L5
and L5-S1 levels.
IMPRESSION: No recent fracture is seen in the lumbar spine. Significant
degenerative changes are noted at L4-L5 and L5-S1 levels with disc
space narrowing, bony spurs and facet hypertrophy.

## 2023-01-25 ENCOUNTER — Encounter (HOSPITAL_COMMUNITY): Payer: Self-pay | Admitting: Psychiatry

## 2023-01-25 ENCOUNTER — Telehealth (HOSPITAL_BASED_OUTPATIENT_CLINIC_OR_DEPARTMENT_OTHER): Payer: Self-pay | Admitting: Psychiatry

## 2023-01-25 VITALS — Wt 181.0 lb

## 2023-01-25 DIAGNOSIS — F319 Bipolar disorder, unspecified: Secondary | ICD-10-CM

## 2023-01-25 DIAGNOSIS — F4310 Post-traumatic stress disorder, unspecified: Secondary | ICD-10-CM

## 2023-01-25 MED ORDER — QUETIAPINE FUMARATE 200 MG PO TABS
200.0000 mg | ORAL_TABLET | Freq: Every day | ORAL | 0 refills | Status: DC
Start: 2023-01-25 — End: 2023-04-30

## 2023-01-25 MED ORDER — QUETIAPINE FUMARATE 25 MG PO TABS
25.0000 mg | ORAL_TABLET | Freq: Every day | ORAL | 2 refills | Status: DC
Start: 2023-01-25 — End: 2023-10-29

## 2023-01-25 MED ORDER — LAMOTRIGINE 200 MG PO TABS
200.0000 mg | ORAL_TABLET | Freq: Every day | ORAL | 0 refills | Status: DC
Start: 2023-01-25 — End: 2023-04-30

## 2023-01-25 NOTE — Progress Notes (Signed)
[facility] MD Virtual Progress Note   Patient Location: Home Provider Location: Home office  I connect with patient by video and verified that I am speaking with correct person by using two identifiers. I discussed the limitations of evaluation and management by telemedicine and the availability of in person appointments. I also discussed with the patient that there may be a patient responsible charge related to this service. The patient expressed understanding and agreed to proceed.  Stefanie Galvan [id number] 57 y.o.  01/25/2023 9:53 AM  History of Present Illness:  Patient is evaluated by phone session.  She is doing very well on current medication.  She cut down her Seroquel 25 mg and only taking 1 pill a day.  She sleeps good.  She tried to keep herself busy.  She has no rash, itching, tremors or shakes.  She is very happy as her grandchild is now 30 months old.  She admitted some time having issues with the mother but most of the time she able to manage.  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  Patient told she had lost more than 20 pounds because she is watching her calorie intake, exercise and she feels very happy about it.  She denies any nightmares, flashback, aggression, violence.  Her PTSD symptoms are stable.  She wants to keep the current medication.     Past Psychiatric History: H/O bipolar, PTSD and poor impulse control with excessive talking and spending. H/O inpatient [facility], [facility] and [facility]. Last inpatient in July 2020 at [facility] and than IOP. H/O overdose on lithium, trazodone, Ambien,Lexapro and Minipress. Took Seroquel, Zoloft, Cymbalta, Paxil, Prozac, Ativan, Abilify, Wellbutrin and vistaril.       Outpatient Encounter Medications as of 01/25/2023  Medication Sig   ibuprofen (ADVIL) 200 MG tablet Take 600 mg by mouth every 6 (six) hours  as needed for moderate pain.   lamoTRIgine (LAMICTAL) 200 MG tablet Take 1 tablet (200 mg total) by mouth daily.   QUEtiapine (SEROQUEL) 200 MG tablet Take 1 tablet (200 mg total) by mouth at bedtime.   QUEtiapine (SEROQUEL) 25 MG tablet Take 1 tablet (25 mg total) by mouth 2 (two) times daily.   No facility-administered encounter medications on file as of 01/25/2023.    No results found for this or any previous visit (from the past 2160 hour(s)).   Psychiatric Specialty Exam: Physical Exam  Review of Systems  Weight 181 lb (82.1 kg).There is no height or weight on file to calculate BMI.  General Appearance: NA  Eye Contact:  NA  Speech:  Clear and Coherent and Normal Rate  Volume:  Normal  Mood:  Euthymic  Affect:  Appropriate  Thought Process:  Goal Directed  Orientation:  Full (Time, Place, and Person)  Thought Content:  WDL  Suicidal Thoughts:  No  Homicidal Thoughts:  No  Memory:  Immediate;   Good Recent;   Good Remote;   Good  Judgement:  Intact  Insight:  Good  Psychomotor Activity:  Normal  Concentration:  Concentration: Good and Attention Span: Good  Recall:  Good  Fund of Knowledge:  Good  Language:  Good  Akathisia:  No  Handed:  Right  AIMS (if indicated):     Assets:  Communication Skills Desire for Improvement Housing Resilience Social Support Talents/Skills Transportation  ADL's:  Intact  Cognition:  WNL  Sleep:  ok     Assessment/Plan: Bipolar 1 disorder, depressed (HCC) - Plan: QUEtiapine (SEROQUEL) 200 MG tablet, QUEtiapine (SEROQUEL) 25 MG tablet, lamoTRIgine (LAMICTAL) 200 MG tablet  PTSD (post-traumatic stress disorder) - Plan: QUEtiapine (SEROQUEL) 200 MG tablet, QUEtiapine (SEROQUEL) 25 MG tablet  Patient is stable on current medication.  Continue Lamictal 200 mg daily, Seroquel 200 mg daily and reduce Seroquel 25 mg once a day.  She enjoys the company of the grandkids.  Recommend to call us back if she has any question or any concern.   Follow-up in 3 months   Follow Up Instructions:     I discussed the assessment and treatment plan with the patient. The patient was provided an opportunity to ask questions and all were answered. The patient agreed with the plan and demonstrated an understanding of the instructions.   The patient was advised to call back or seek an in-person evaluation if the symptoms worsen or if the condition fails to improve as anticipated.    Collaboration of Care: Other provider involved in patient's care AEB notes are available in epic to review  Patient/Guardian was advised Release of Information must be obtained prior to any record release in order to collaborate their care with an outside provider. Patient/Guardian was advised if they have not already done so to contact the registration department to sign all necessary forms in order for [place] to release information regarding their care.   Consent: Patient/Guardian gives verbal consent for treatment and assignment of benefits for services provided during this visit. Patient/Guardian expressed understanding and agreed to proceed.     I provided 16 minutes of non face to face time during this encounter.  Note: This document was prepared by [employer] voice dictation technology and any errors that results from this process are unintentional.    Cleotis Nipper, MD 01/25/2023

## 2023-04-26 ENCOUNTER — Telehealth (HOSPITAL_BASED_OUTPATIENT_CLINIC_OR_DEPARTMENT_OTHER): Payer: Self-pay | Admitting: Psychiatry

## 2023-04-26 ENCOUNTER — Encounter (HOSPITAL_COMMUNITY): Payer: Self-pay

## 2023-04-26 DIAGNOSIS — Z91199 Patient's noncompliance with other medical treatment and regimen due to unspecified reason: Secondary | ICD-10-CM

## 2023-04-26 NOTE — Progress Notes (Signed)
Patient no show today. Try to leave message on her cell phone but it did not let.

## 2023-04-30 ENCOUNTER — Telehealth (HOSPITAL_COMMUNITY): Payer: PRIVATE HEALTH INSURANCE | Admitting: Psychiatry

## 2023-04-30 ENCOUNTER — Encounter (HOSPITAL_COMMUNITY): Payer: Self-pay | Admitting: Psychiatry

## 2023-04-30 VITALS — Wt 175.0 lb

## 2023-04-30 DIAGNOSIS — F4310 Post-traumatic stress disorder, unspecified: Secondary | ICD-10-CM | POA: Diagnosis not present

## 2023-04-30 DIAGNOSIS — F319 Bipolar disorder, unspecified: Secondary | ICD-10-CM | POA: Diagnosis not present

## 2023-04-30 MED ORDER — QUETIAPINE FUMARATE 200 MG PO TABS
200.0000 mg | ORAL_TABLET | Freq: Every day | ORAL | 0 refills | Status: DC
Start: 2023-04-30 — End: 2023-07-31

## 2023-04-30 MED ORDER — LAMOTRIGINE 200 MG PO TABS
200.0000 mg | ORAL_TABLET | Freq: Every day | ORAL | 0 refills | Status: DC
Start: 2023-04-30 — End: 2023-07-31

## 2023-04-30 NOTE — Progress Notes (Signed)
[facility] MD Virtual Progress Note   Patient Location: Home Provider Location: Home Office  I connect with patient by telephone and verified that I am speaking with correct person by using two identifiers. I discussed the limitations of evaluation and management by telemedicine and the availability of in person appointments. I also discussed with the patient that there may be a patient responsible charge related to this service. The patient expressed understanding and agreed to proceed.  Stefanie Galvan [id number] 57 y.o.  04/30/2023 8:59 AM  History of Present Illness:  Patient is evaluated by phone session.  She apologized missing last appointment.  She was very sick and taking antibiotic and steroids but now feeling better.  She reported things are going very well.  She is happy about upcoming holidays.  She is going to have Thanksgiving at her daughter's house and she will see her all 3 children with the family.  Her granddaughter is now 68 months old.  Patient told her mother and aunt are not invited to Thanksgiving by the children because usually she brings negativity. Patient agreed with the decision.  She is doing shat dance and like to do with a regular basis.  Patient reported there are few times she has flashback and nightmares but not sure what triggered.  She wake up and check the dose and then went back to sleep.  She remains active and enjoy her job but is very flexible.  She is trying to lose weight and she lost another 5 pounds since the last visit.  She has appointment coming up after the holidays with her primary care at [facility].  She has not done physical in 2 years and realize that she need to have the blood work and physical.  Patient denies any mania, psychosis, hallucination.  She has broken 2 years support certification and she wants to do the process after the holidays.  He has no rash, itching.  She liked the Lamictal and Seroquel.   Occasionally she takes Seroquel 25 mg when she get upset.  Most of the time trigger is her mother.    Past Psychiatric History: H/O bipolar, PTSD and poor impulse control with excessive talking and spending. H/O inpatient [facility] and [facility]. Last inpatient in July 2020 at [facility] and IOP. H/O overdose on lithium, trazodone, Ambien,Lexapro and Minipress. Took Seroquel, Zoloft, Cymbalta, Paxil, Prozac, Ativan, Abilify, Wellbutrin and vistaril.       Outpatient Encounter Medications as of 04/30/2023  Medication Sig   ibuprofen (ADVIL) 200 MG tablet Take 600 mg by mouth every 6 (six) hours as needed for moderate pain.   lamoTRIgine (LAMICTAL) 200 MG tablet Take 1 tablet (200 mg total) by mouth daily.   QUEtiapine (SEROQUEL) 200 MG tablet Take 1 tablet (200 mg total) by mouth at bedtime.   QUEtiapine (SEROQUEL) 25 MG tablet Take 1 tablet (25 mg total) by mouth daily at 6 (six) AM.   No facility-administered encounter medications on file as of 04/30/2023.    No results found for this or any previous visit (from the past 2160 hour(s)).   Psychiatric Specialty Exam: Physical Exam  Review of Systems  Weight 175 lb (79.4 kg).There is no height or weight on file to calculate BMI.  General Appearance: NA  Eye Contact:  NA  Speech:  Normal Rate  Volume:  Normal  Mood:  Euthymic  Affect:  Appropriate  Thought Process:  Goal Directed  Orientation:  Full (Time, Place, and  Person)  Thought Content:  WDL  Suicidal Thoughts:  No  Homicidal Thoughts:  No  Memory:  Immediate;   Good Recent;   Good Remote;   Good  Judgement:  Good  Insight:  Present  Psychomotor Activity:  Normal  Concentration:  Concentration: Good and Attention Span: Good  Recall:  Good  Fund of Knowledge:  Good  Language:  Good  Akathisia:  No  Handed:  Right  AIMS (if indicated):     Assets:  Communication Skills Desire for Improvement Housing Resilience Social Support Talents/Skills Transportation   ADL's:  Intact  Cognition:  WNL  Sleep:  better     Assessment/Plan: Bipolar 1 disorder, depressed (HCC) - Plan: lamoTRIgine (LAMICTAL) 200 MG tablet, QUEtiapine (SEROQUEL) 200 MG tablet  PTSD (post-traumatic stress disorder) - Plan: QUEtiapine (SEROQUEL) 200 MG tablet  Discussed occasional nightmares and flashback but it is manageable and does not interfere in her sleep all night.  She is excited about upcoming Thanksgiving with the kids and their family.  I discussed Seroquel 25 mg can be discontinued since medicine is working very well.  She can keep the Seroquel 200 mg at bedtime and Lamictal 200 mg daily.  I encouraged to have a blood work and physical since she has not done in 2 years.  Encouraged to have those results faxed to [place].  Patient has appointment coming up next month at [facility].  Patient agreed with the plan.  Recommended to call us back if she is any question or any concern.  Follow-up in 3 months   Follow Up Instructions:     I discussed the assessment and treatment plan with the patient. The patient was provided an opportunity to ask questions and all were answered. The patient agreed with the plan and demonstrated an understanding of the instructions.   The patient was advised to call back or seek an in-person evaluation if the symptoms worsen or if the condition fails to improve as anticipated.    Collaboration of Care: Other provider involved in patient's care AEB notes are available in epic to review  Patient/Guardian was advised Release of Information must be obtained prior to any record release in order to collaborate their care with an outside provider. Patient/Guardian was advised if they have not already done so to contact the registration department to sign all necessary forms in order for [place] to release information regarding their care.   Consent: Patient/Guardian gives verbal consent for treatment and assignment of benefits for services  provided during this visit. Patient/Guardian expressed understanding and agreed to proceed.     I provided 20 minutes of non face to face time during this encounter.  Note: This document was prepared by [employer] voice dictation technology and any errors that results from this process are unintentional.    Cleotis Nipper, MD 04/30/2023

## 2023-07-31 ENCOUNTER — Telehealth (HOSPITAL_BASED_OUTPATIENT_CLINIC_OR_DEPARTMENT_OTHER): Payer: Self-pay | Admitting: Psychiatry

## 2023-07-31 ENCOUNTER — Encounter (HOSPITAL_COMMUNITY): Payer: Self-pay | Admitting: Psychiatry

## 2023-07-31 VITALS — Wt 181.0 lb

## 2023-07-31 DIAGNOSIS — F4310 Post-traumatic stress disorder, unspecified: Secondary | ICD-10-CM

## 2023-07-31 DIAGNOSIS — F319 Bipolar disorder, unspecified: Secondary | ICD-10-CM

## 2023-07-31 MED ORDER — LAMOTRIGINE 200 MG PO TABS
200.0000 mg | ORAL_TABLET | Freq: Every day | ORAL | 0 refills | Status: DC
Start: 2023-07-31 — End: 2023-10-29

## 2023-07-31 MED ORDER — QUETIAPINE FUMARATE 200 MG PO TABS
200.0000 mg | ORAL_TABLET | Freq: Every day | ORAL | 0 refills | Status: DC
Start: 2023-07-31 — End: 2023-10-29

## 2023-07-31 NOTE — Progress Notes (Signed)
[facility] MD Virtual Progress Note   Patient Location: Home Provider Location: Home Office  I connect with patient by video and verified that I am speaking with correct person by using two identifiers. I discussed the limitations of evaluation and management by telemedicine and the availability of in person appointments. I also discussed with the patient that there may be a patient responsible charge related to this service. The patient expressed understanding and agreed to proceed.  Stefanie Galvan [id number] 58 y.o.  07/31/2023 8:41 AM  History of Present Illness:  Patient is evaluated by video session.  She forgot to do the blood work but agreed to have labs in our office.  Overall she feels very good about herself.  She reported last year was very productive and she may have more than $69,000.  She had a good holidays.  Patient has plan to spend time with her friends this year.  Her best friend lives in [place].  Plan is to visit [place].  She reported mood is stable and denies any mania, agitation, anger but is still sometimes having dreams.  Overall sleep is okay.  She is in the process of buying a new house which may happen in May and she is excited about it.  Her job is going very well.  Her appetite is okay but she had gained weight during the holidays.  She reported once in a while when she gets upset notable she think about herself and tried to relax and get out of the conversation which helps her a lot.  She has no tremor or shakes or any EPS.  She has no rash or itching.  She is taking Lamictal and Seroquel.  She has not taken additional Seroquel 25 mg in a while.  She denies drinking or using any illegal substances.  She lives by herself and her.  She enjoys the company of the granddaughter.  Past Psychiatric History: H/O bipolar, PTSD and poor impulse control with excessive talking and spending. H/O inpatient [facility] and [facility]. Last  inpatient in July 2020 at [facility] and IOP. H/O overdose on lithium, trazodone, Ambien,Lexapro and Minipress. Took Seroquel, Zoloft, Cymbalta, Paxil, Prozac, Ativan, Abilify, Wellbutrin and vistaril.       Outpatient Encounter Medications as of 07/31/2023  Medication Sig   ibuprofen (ADVIL) 200 MG tablet Take 600 mg by mouth every 6 (six) hours as needed for moderate pain.   lamoTRIgine (LAMICTAL) 200 MG tablet Take 1 tablet (200 mg total) by mouth daily.   QUEtiapine (SEROQUEL) 200 MG tablet Take 1 tablet (200 mg total) by mouth at bedtime.   QUEtiapine (SEROQUEL) 25 MG tablet Take 1 tablet (25 mg total) by mouth daily at 6 (six) AM. (Patient not taking: Reported on 04/30/2023)   No facility-administered encounter medications on file as of 07/31/2023.    No results found for this or any previous visit (from the past 2160 hours).   Psychiatric Specialty Exam: Physical Exam  Review of Systems  Weight 181 lb (82.1 kg).There is no height or weight on file to calculate BMI.  General Appearance: Casual  Eye Contact:  Good  Speech:  Normal Rate  Volume:  Normal  Mood:  Euthymic  Affect:  Appropriate  Thought Process:  Goal Directed  Orientation:  Full (Time, Place, and Person)  Thought Content:  Logical  Suicidal Thoughts:  No  Homicidal Thoughts:  No  Memory:  Immediate;   Good Recent;   Good Remote;  Good  Judgement:  Good  Insight:  Good  Psychomotor Activity:  Normal  Concentration:  Concentration: Good and Attention Span: Good  Recall:  Good  Fund of Knowledge:  Good  Language:  Good  Akathisia:  No  Handed:  Right  AIMS (if indicated):     Assets:  Communication Skills Desire for Improvement Financial Resources/Insurance Housing Resilience Social Support Transportation  ADL's:  Intact  Cognition:  WNL  Sleep:  fair but having dreams     Assessment/Plan: Bipolar 1 disorder, depressed (HCC) - Plan: lamoTRIgine (LAMICTAL) 200 MG tablet, QUEtiapine (SEROQUEL) 200 MG  tablet  PTSD (post-traumatic stress disorder) - Plan: QUEtiapine (SEROQUEL) 200 MG tablet  Discussed occasional dreams and flashbacks but most of the night she is sleeping good.  Does not want to take more medication.  Continue Seroquel 200 mg at bedtime and Lamictal 200 mg daily.  She has no rash or any itching.  We will order CBC, CMP, hemoglobin A1c in our office.  She admitted weight gain since the last visit but now her plan is to work out and watch her calorie intake.  She is hoping when the weather gets better she will start playing ball games which she used to in the past.  Follow-up in 3 months.  Recommended to call us back if she has any question or any concern.   Follow Up Instructions:     I discussed the assessment and treatment plan with the patient. The patient was provided an opportunity to ask questions and all were answered. The patient agreed with the plan and demonstrated an understanding of the instructions.   The patient was advised to call back or seek an in-person evaluation if the symptoms worsen or if the condition fails to improve as anticipated.    Collaboration of Care: Other provider involved in patient's care AEB notes are available in epic to review  Patient/Guardian was advised Release of Information must be obtained prior to any record release in order to collaborate their care with an outside provider. Patient/Guardian was advised if they have not already done so to contact the registration department to sign all necessary forms in order for [place] to release information regarding their care.   Consent: Patient/Guardian gives verbal consent for treatment and assignment of benefits for services provided during this visit. Patient/Guardian expressed understanding and agreed to proceed.     I provided 27 minutes of non face to face time during this encounter.  Note: This document was prepared by [employer] voice dictation technology and any errors that results from  this process are unintentional.    Cleotis Nipper, MD 07/31/2023

## 2023-10-29 ENCOUNTER — Encounter (HOSPITAL_COMMUNITY): Payer: Self-pay | Admitting: Psychiatry

## 2023-10-29 ENCOUNTER — Telehealth (HOSPITAL_COMMUNITY): Payer: PRIVATE HEALTH INSURANCE | Admitting: Psychiatry

## 2023-10-29 VITALS — Wt 195.0 lb

## 2023-10-29 DIAGNOSIS — F319 Bipolar disorder, unspecified: Secondary | ICD-10-CM

## 2023-10-29 DIAGNOSIS — F4310 Post-traumatic stress disorder, unspecified: Secondary | ICD-10-CM

## 2023-10-29 DIAGNOSIS — F4321 Adjustment disorder with depressed mood: Secondary | ICD-10-CM | POA: Diagnosis not present

## 2023-10-29 MED ORDER — QUETIAPINE FUMARATE 200 MG PO TABS
200.0000 mg | ORAL_TABLET | Freq: Every day | ORAL | 0 refills | Status: DC
Start: 2023-10-29 — End: 2024-01-28

## 2023-10-29 MED ORDER — LAMOTRIGINE 200 MG PO TABS
200.0000 mg | ORAL_TABLET | Freq: Every day | ORAL | 0 refills | Status: DC
Start: 2023-10-29 — End: 2024-01-28

## 2023-10-29 NOTE — Progress Notes (Signed)
 [facility] MD Virtual Progress Note   Patient Location: Home Provider Location: Home Office  I connect with patient by telephone and verified that I am speaking with correct person by using two identifiers. I discussed the limitations of evaluation and management by telemedicine and the availability of in person appointments. I also discussed with the patient that there may be a patient responsible charge related to this service. The patient expressed understanding and agreed to proceed.  Stefanie Galvan [id number] 58 y.o.  10/29/2023 8:24 AM  History of Present Illness:  Patient is evaluated by phone session.  She just woke up and could not do video.  Patient told she had rough days for past few weeks because 3 of her family member died in 12 days.  Patient told her 52 year old aunt who she consider as a mother died after hip surgery and required rehabilitation but did not recover very well.  Patient told she was very sad but now slowly and gradually getting back to normal.  She reported things are going otherwise okay.  She recently moved to new place and she will update and hoping to buy after a year.  Patient started going to church and had good support from the church people.  Patient started reading a book about grief which was given one of her church friend.  She admitted weight gain because not active and not watching her calorie intake.  She still struggle with sleep sometimes and having nightmares and flashback but overall she reported mood stable.  She has a better control on her finances.  Patient told she goes to shopping places but does not buy unless she requires something.  She is taking Lamictal  and Seroquel .  She has no tremors, shakes, rash or any itching.  She lives by herself.  She enjoys the company of her granddaughter who recently had a birthday and now 74-year-old.  Patient feel disappointed because could not go to [place] with her best friend as around  the same time patient lost her aunt.  However her plan is to go to [place]  for class reunion in July.  Her job is going well.  She is happy as feeling better and productive at work.  Since she gained weight she started walking but also had appointment coming up to see her primary care on June 12.  She denies drinking or using any illegal substances.  She denies any irritability, hallucination or any paranoia.  Past Psychiatric History: H/O bipolar, PTSD and poor impulse control with excessive talking and spending. H/O inpatient [facility]. Last inpatient in July 2020 at [facility] and IOP. H/O overdose on lithium , trazodone , Ambien,Lexapro  and Minipress. Took Seroquel , Zoloft, Cymbalta, Paxil, Prozac, Ativan , Abilify , Wellbutrin and vistaril .       Outpatient Encounter Medications as of 10/29/2023  Medication Sig   ibuprofen  (ADVIL ) 200 MG tablet Take 600 mg by mouth every 6 (six) hours as needed for moderate pain.   lamoTRIgine  (LAMICTAL ) 200 MG tablet Take 1 tablet (200 mg total) by mouth daily.   QUEtiapine  (SEROQUEL ) 200 MG tablet Take 1 tablet (200 mg total) by mouth at bedtime.   QUEtiapine  (SEROQUEL ) 25 MG tablet Take 1 tablet (25 mg total) by mouth daily at 6 (six) AM. (Patient not taking: Reported on 04/30/2023)   No facility-administered encounter medications on file as of 10/29/2023.    No results found for this or any previous visit (from the past 2160 hours).   Psychiatric Specialty  Exam: Physical Exam  Review of Systems  Weight 195 lb (88.5 kg).There is no height or weight on file to calculate BMI.  General Appearance: NA  Eye Contact:  NA  Speech:  Slow  Volume:  Decreased  Mood:  Dysphoric  Affect:  NA  Thought Process:  Goal Directed  Orientation:  Full (Time, Place, and Person)  Thought Content:  Rumination  Suicidal Thoughts:  No  Homicidal Thoughts:  No  Memory:  Immediate;   Good Recent;   Good Remote;   Good  Judgement:  Intact   Insight:  Present  Psychomotor Activity:  Decreased  Concentration:  Concentration: Good and Attention Span: Good  Recall:  Good  Fund of Knowledge:  Good  Language:  Good  Akathisia:  No  Handed:  Right  AIMS (if indicated):     Assets:  Communication Skills Desire for Improvement Financial Resources/Insurance Social Support Talents/Skills Transportation  ADL's:  Intact  Cognition:  WNL  Sleep:  fair, still nightmares        10/29/2023    8:34 AM 12/24/2018   12:42 PM 03/29/2017    9:00 AM 03/25/2017    9:00 AM 03/15/2017   10:59 AM  Depression screen PHQ 2/9  Decreased Interest 1 3 0 1 0  Down, Depressed, Hopeless 1 3 1 1 1   PHQ - 2 Score 2 6 1 2 1   Altered sleeping 1 2 1 1 2   Tired, decreased energy 0 3 1 1  0  Change in appetite 0 3 0 0 0  Feeling bad or failure about yourself  0 1 2 1 1   Trouble concentrating 0 0 1 0 0  Moving slowly or fidgety/restless 0 3 0 0 0  Suicidal thoughts  2 1 1 1   PHQ-9 Score 3 20 7 6 5   Difficult doing work/chores Not difficult at all        Assessment/Plan: Bipolar 1 disorder, depressed (HCC) - Plan: lamoTRIgine  (LAMICTAL ) 200 MG tablet, QUEtiapine  (SEROQUEL ) 200 MG tablet  PTSD (post-traumatic stress disorder) - Plan: QUEtiapine  (SEROQUEL ) 200 MG tablet  Grief  Recent loss of 3 family member including her 20 year old aunt who she is very close to.  I offered grief counseling but patient is connected with the church and reading book about the grief and that has been very helpful.  Weight gain discussed and now she started walking.  Encouraged to keep appointment with new primary care at [facility] for blood work.  Emphasized to get the Depakote level.  Her mood is stable.  She does not want to change the medication.  Continue Seroquel  200 mg at bedtime and Lamictal  200 mg daily.  She is no longer taking Seroquel  25 mg anymore.  Recommend to call us  back if she is any question or any concern.  Follow-up in 3 months.  Patient  promised to have video visit next time.   Follow Up Instructions:     I discussed the assessment and treatment plan with the patient. The patient was provided an opportunity to ask questions and all were answered. The patient agreed with the plan and demonstrated an understanding of the instructions.   The patient was advised to call back or seek an in-person evaluation if the symptoms worsen or if the condition fails to improve as anticipated.    Collaboration of Care: Other provider involved in patient's care AEB notes are available in epic to review  Patient/Guardian was advised Release of Information must be obtained prior  to any record release in order to collaborate their care with an outside provider. Patient/Guardian was advised if they have not already done so to contact the registration department to sign all necessary forms in order for us  to release information regarding their care.   Consent: Patient/Guardian gives verbal consent for treatment and assignment of benefits for services provided during this visit. Patient/Guardian expressed understanding and agreed to proceed.     Total encounter time 24 minutes which includes face-to-face time, chart reviewed, care coordination, order entry and documentation during this encounter.   Note: This document was prepared by [employer] voice dictation technology and any errors that results from this process are unintentional.    Arturo Late, MD 10/29/2023

## 2024-01-28 ENCOUNTER — Encounter (HOSPITAL_COMMUNITY): Payer: Self-pay | Admitting: Psychiatry

## 2024-01-28 ENCOUNTER — Telehealth (HOSPITAL_COMMUNITY): Payer: PRIVATE HEALTH INSURANCE | Admitting: Psychiatry

## 2024-01-28 VITALS — Wt 200.0 lb

## 2024-01-28 DIAGNOSIS — F4310 Post-traumatic stress disorder, unspecified: Secondary | ICD-10-CM

## 2024-01-28 DIAGNOSIS — F411 Generalized anxiety disorder: Secondary | ICD-10-CM

## 2024-01-28 DIAGNOSIS — F319 Bipolar disorder, unspecified: Secondary | ICD-10-CM

## 2024-01-28 MED ORDER — LAMOTRIGINE 200 MG PO TABS: 200.0000 mg | ORAL_TABLET | Freq: Every day | ORAL | 0 refills | Status: DC

## 2024-01-28 MED ORDER — QUETIAPINE FUMARATE 200 MG PO TABS: 200.0000 mg | ORAL_TABLET | Freq: Every day | ORAL | 0 refills | Status: DC

## 2024-01-28 NOTE — Progress Notes (Signed)
 [facility] MD Virtual Progress Note   Patient Location: Home Provider Location: Home Office  I connect with patient by video and verified that I am speaking with correct person by using two identifiers. I discussed the limitations of evaluation and management by telemedicine and the availability of in person appointments. I also discussed with the patient that there may be a patient responsible charge related to this service. The patient expressed understanding and agreed to proceed.  Stefanie Galvan [id number] 58 y.o.  01/28/2024 8:27 AM  History of Present Illness:  Patient is evaluated by video session.  She is taking Seroquel  and Lamictal .  She reported things are going okay but today she wants to share the news that she had decided to go back to [place] .  Patient told she is living in [place] for 35 years but this July when she had a reunion with her friends she feels very good on her friends.  Patient told she always wanted to go back to [place]  where she grew up she thinks she is ready.  Patient told she has 3 kids who live here and she is very close to her granddaughter and her plan is to come once a month to visit them.  Patient discussed with her employer and they are okay because there is a position available in [place]  and she can moved her job care.  Patient told she has very good supportive network and one of her friend actually moved her furniture already and currently she is living on air mattress.  Her plan is to move this Thursday.  Patient reported her symptoms are stable and she denies any mania, psychosis, impulsive behavior.  Her job is also going very well.  Recently she had a physical and blood work.  She reported her cholesterol is high but no medicine added other than recommended to watch her weight.  She admitted few pounds weight gain.  She like to go back to exercise and increase her physical activity.  She is taking vitamin D which was low but no  other new medication added.  She will buy herself.  She has a 80-year-old granddaughter.  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  She has no tremors, shakes or any EPS.  She denies any hallucination, paranoia.  Occasionally nightmares or flashbacks but otherwise sleep is good.  Patient denies any panic attack.  Past Psychiatric History: H/O bipolar, PTSD and poor impulse control with excessive talking and spending. [facility] inpatient [facility]. Last inpatient in July 2020 at [facility] and IOP. H/O overdose on lithium , trazodone , Ambien,Lexapro  and Minipress. Took Seroquel , Zoloft, Cymbalta, Paxil, Prozac, Ativan , Abilify , Wellbutrin and vistaril .      Past Medical History:  Diagnosis Date   Anxiety    Arthritis    Depression 2.5 yrs ago   situational dep-no current tx/meds   TBI (traumatic brain injury) (HCC) 11/10/2005    Outpatient Encounter Medications as of 01/28/2024  Medication Sig   ibuprofen  (ADVIL ) 200 MG tablet Take 600 mg by mouth every 6 (six) hours as needed for moderate pain.   lamoTRIgine  (LAMICTAL ) 200 MG tablet Take 1 tablet (200 mg total) by mouth daily.   QUEtiapine  (SEROQUEL ) 200 MG tablet Take 1 tablet (200 mg total) by mouth at bedtime.   No facility-administered encounter medications on file as of 01/28/2024.    No  results found for this or any previous visit (from the past 2160 hours).   Psychiatric Specialty Exam: Physical Exam  Review of Systems  Weight 200 lb (90.7 kg).There is no height or weight on file to calculate BMI.  General Appearance: Casual  Eye Contact:  Good  Speech:  Clear and Coherent  Volume:  Normal  Mood:  Euthymic  Affect:  Appropriate  Thought Process:  Goal Directed  Orientation:  Full (Time, Place, and Person)  Thought Content:  Logical  Suicidal Thoughts:  No  Homicidal Thoughts:  No  Memory:  Immediate;   Good Recent;    Good Remote;   Good  Judgement:  Good  Insight:  Good  Psychomotor Activity:  Normal  Concentration:  Concentration: Good and Attention Span: Good  Recall:  Good  Fund of Knowledge:  Good  Language:  Good  Akathisia:  No  Handed:  Right  AIMS (if indicated):     Assets:  Communication Skills Desire for Improvement Housing Resilience Social Support Transportation  ADL's:  Intact  Cognition:  WNL  Sleep: Fair       10/29/2023    8:34 AM 12/24/2018   12:42 PM 03/29/2017    9:00 AM 03/25/2017    9:00 AM 03/15/2017   10:59 AM  Depression screen PHQ 2/9  Decreased Interest 1 3 0 1 0  Down, Depressed, Hopeless 1 3 1 1 1   PHQ - 2 Score 2 6 1 2 1   Altered sleeping 1 2 1 1 2   Tired, decreased energy 0 3 1 1  0  Change in appetite 0 3 0 0 0  Feeling bad or failure about yourself  0 1 2 1 1   Trouble concentrating 0 0 1 0 0  Moving slowly or fidgety/restless 0 3 0 0 0  Suicidal thoughts  2 1 1 1   PHQ-9 Score 3 20 7 6 5   Difficult doing work/chores Not difficult at all        Assessment/Plan: Bipolar 1 disorder, depressed (HCC) - Plan: lamoTRIgine  (LAMICTAL ) 200 MG tablet, QUEtiapine  (SEROQUEL ) 200 MG tablet  PTSD (post-traumatic stress disorder) - Plan: QUEtiapine  (SEROQUEL ) 200 MG tablet  Generalized anxiety disorder  Patient is 58 year old Caucasian and white female with history of hyperlipidemia, QT prolongation, bipolar disorder, generalized anxiety disorder, PTSD currently stable on Seroquel  200 mg and Lamictal  200 mg daily.  She has no rash, itching, tremors or shakes.  Discussed the decision about moving to [place]  and she is very happy about it.  She also wants to know if she can cut down the medication.  I explained she should wait until she see the new provider locally and follow the new provider advice.  So far her symptoms are stable and she is doing very well.  Discussed support system she has in the [place] .  I encouraged to find a local provider but in the meantime  she will continue the medication from our office.  She is no longer taking extra Seroquel  25 mg.  Clinically she is very stable.  She had blood work at her PCP.  She told liver the level was normal.  She has high cholesterol but she does not remember the numbers.  She will get the copy of the result and fax to us .  Will follow-up in 3 months and at that time she has better picture about her future provider locally in [place] .  Encouraged to call back if she has any question or any concern.  Follow-up in 3  months.  I wish her good luck about her moving.   Follow Up Instructions:     I discussed the assessment and treatment plan with the patient. The patient was provided an opportunity to ask questions and all were answered. The patient agreed with the plan and demonstrated an understanding of the instructions.   The patient was advised to call back or seek an in-person evaluation if the symptoms worsen or if the condition fails to improve as anticipated.    Collaboration of Care: Other provider involved in patient's care AEB notes are available in epic to review  Patient/Guardian was advised Release of Information must be obtained prior to any record release in order to collaborate their care with an outside provider. Patient/Guardian was advised if they have not already done so to contact the registration department to sign all necessary forms in order for us  to release information regarding their care.   Consent: Patient/Guardian gives verbal consent for treatment and assignment of benefits for services provided during this visit. Patient/Guardian expressed understanding and agreed to proceed.     Total encounter time 26 minutes which includes face-to-face time, chart reviewed, care coordination, order entry and documentation during this encounter.   Note: This document was prepared by [employer] voice dictation technology and any errors that results from this process are unintentional.    Leni ONEIDA Client, MD 01/28/2024

## 2024-04-28 ENCOUNTER — Telehealth (HOSPITAL_COMMUNITY): Payer: PRIVATE HEALTH INSURANCE | Admitting: Psychiatry

## 2024-04-28 ENCOUNTER — Telehealth (HOSPITAL_COMMUNITY): Payer: Self-pay | Admitting: Psychiatry

## 2024-04-28 ENCOUNTER — Encounter (HOSPITAL_COMMUNITY): Payer: Self-pay | Admitting: Psychiatry

## 2024-04-28 VITALS — Wt 200.0 lb

## 2024-04-28 DIAGNOSIS — F4310 Post-traumatic stress disorder, unspecified: Secondary | ICD-10-CM | POA: Diagnosis not present

## 2024-04-28 DIAGNOSIS — F319 Bipolar disorder, unspecified: Secondary | ICD-10-CM | POA: Diagnosis not present

## 2024-04-28 MED ORDER — LAMOTRIGINE 200 MG PO TABS: 200.0000 mg | ORAL_TABLET | Freq: Every day | ORAL | 0 refills | Status: AC

## 2024-04-28 MED ORDER — QUETIAPINE FUMARATE 200 MG PO TABS: 200.0000 mg | ORAL_TABLET | Freq: Every day | ORAL | 0 refills | Status: AC

## 2024-04-28 NOTE — Telephone Encounter (Signed)
 Per patient, patient has moved out of state and has found a new provider will no longer schedule future appointments with office. Patient has been sent dismissal letter on 04/28/2024.

## 2024-04-28 NOTE — Progress Notes (Signed)
 [facility] MD Virtual Progress Note   Patient Location: Home Provider Location: Home Office  I connect with patient by video and verified that I am speaking with correct person by using two identifiers. I discussed the limitations of evaluation and management by telemedicine and the availability of in person appointments. I also discussed with the patient that there may be a patient responsible charge related to this service. The patient expressed understanding and agreed to proceed.  Stefanie Galvan [id number] 57 y.o.  04/28/2024 8:30 AM  History of Present Illness:  Patient is evaluated by video session.  She is now moved to [place]  and ready like it.  She has a good support and her job is also going very well.  Sometimes she sleeps not good and wake up with nightmares.  She believes may need to have a sleep study because sometimes she does not feel fresh in the morning.  She denies any mania, psychosis, hallucination.  Denies any panic attack or any anxiety attack.  She had decided to keep the same medication because she feels very stable and tolerating very well all the medication.  She reported her last blood work was normal other than mild elevation of cholesterol.  She has a new PCP who she is going to see next week and she will discuss about her chronic health issues.  She is hoping she can find a referral to psychiatrist from the PCP.  She does not want to change the medication.  She reported appetite is okay and weight is unchanged from the past.  She regret not started exercise but hoping to start very soon.  Patient told one of her close friend has a drinking problem and her plan is to discussed with the friend about stopping the drinking.  Patient is finally settling to the new place.  She has no rash, tremor or shakes or any EPS.  Her plan is to come at least every 6 to 8 weeks to visit her grandchild who lives in [place] .  She is on Seroquel  200 mg and  Lamictal  200 mg daily.  She denies drinking or using any illegal substances.  No panic attacks.  Past Psychiatric History: H/O bipolar, PTSD and poor impulse control with excessive talking and spending. [facility] inpatient [facility]. Last inpatient in July 2020 at [facility] and IOP. H/O overdose on lithium , trazodone , Ambien,Lexapro  and Minipress. Took Seroquel , Zoloft, Cymbalta, Paxil, Prozac, Ativan , Abilify , Wellbutrin and vistaril .      Past Medical History:  Diagnosis Date   Anxiety    Arthritis    Depression 2.5 yrs ago   situational dep-no current tx/meds   TBI (traumatic brain injury) (HCC) 11/10/2005    Outpatient Encounter Medications as of 04/28/2024  Medication Sig   ibuprofen  (ADVIL ) 200 MG tablet Take 600 mg by mouth every 6 (six) hours as needed for moderate pain.   lamoTRIgine  (LAMICTAL ) 200 MG tablet Take 1 tablet (200 mg total) by mouth daily.   QUEtiapine  (SEROQUEL ) 200 MG tablet Take 1 tablet (200 mg total) by mouth at bedtime.   No facility-administered encounter medications on file as of 04/28/2024.    No results found for this or any previous visit (from the past 2160 hours).   Psychiatric Specialty Exam: Physical Exam  Review of Systems  Weight 200 lb (90.7 kg).There is no height or weight on file to calculate BMI.  General Appearance: Casual  Eye Contact:  Good  Speech:  Clear and  Coherent  Volume:  Normal  Mood:  Euthymic  Affect:  Congruent  Thought Process:  Goal Directed  Orientation:  Full (Time, Place, and Person)  Thought Content:  Logical  Suicidal Thoughts:  No  Homicidal Thoughts:  No  Memory:  Immediate;   Good Recent;   Good Remote;   Good  Judgement:  Good  Insight:  Good  Psychomotor Activity:  Normal  Concentration:  Concentration: Good and Attention Span: Good  Recall:  Good  Fund of Knowledge:  Good  Language:  Good  Akathisia:  No  Handed:  Right  AIMS (if indicated):     Assets:  Communication  Skills Desire for Improvement Housing Resilience Social Support Talents/Skills Transportation  ADL's:  Intact  Cognition:  WNL  Sleep:  fair, sometimes nightmares       10/29/2023    8:34 AM 12/24/2018   12:42 PM 03/29/2017    9:00 AM 03/25/2017    9:00 AM 03/15/2017   10:59 AM  Depression screen PHQ 2/9  Decreased Interest 1 3 0 1 0  Down, Depressed, Hopeless 1 3 1 1 1   PHQ - 2 Score 2 6 1 2 1   Altered sleeping 1 2 1 1 2   Tired, decreased energy 0 3 1 1  0  Change in appetite 0 3 0 0 0  Feeling bad or failure about yourself  0 1 2 1 1   Trouble concentrating 0 0 1 0 0  Moving slowly or fidgety/restless 0 3 0 0 0  Suicidal thoughts  2 1 1 1   PHQ-9 Score 3  20  7  6  5    Difficult doing work/chores Not difficult at all         Data saved with a previous flowsheet row definition    Assessment/Plan: Bipolar 1 disorder, depressed (HCC) - Plan: lamoTRIgine  (LAMICTAL ) 200 MG tablet, QUEtiapine  (SEROQUEL ) 200 MG tablet  PTSD (post-traumatic stress disorder) - Plan: QUEtiapine  (SEROQUEL ) 200 MG tablet  Patient is 58 year old Caucasian employed female with history of hyperlipidemia, QT prolongation, bipolar disorder, PTSD and anxiety.  Currently stable on Seroquel  200 mg and Lamictal  200 mg once a day.  She has no rash or any itching.  Discussed future follow-up with a new provider.  She has a PCP and going to see next week and hoping to find a referral for psychiatrist from PCP office.  Discussed long-term prognosis of the illness and risk and benefits of the medication compliance.  I wished her good luck in the future.  Reminded if she have any worsening of symptoms or having any suicidal thoughts or mania then need to call 911 or go to local emergency room.  We will not schedule any future appointments as patient moved to [place]  and will find a new provider there.  I encourage once she has the new provider we can send the records after her consent to the new provider.  Patient  acknowledged and agreed with the plan.     Follow Up Instructions:     I discussed the assessment and treatment plan with the patient. The patient was provided an opportunity to ask questions and all were answered. The patient agreed with the plan and demonstrated an understanding of the instructions.   The patient was advised to call back or seek an in-person evaluation if the symptoms worsen or if the condition fails to improve as anticipated.    Collaboration of Care: Other provider involved in patient's care AEB I will forward my  notes to the new provider once we receive consent from her.  Patient/Guardian was advised Release of Information must be obtained prior to any record release in order to collaborate their care with an outside provider. Patient/Guardian was advised if they have not already done so to contact the registration department to sign all necessary forms in order for us  to release information regarding their care.   Consent: Patient/Guardian gives verbal consent for treatment and assignment of benefits for services provided during this visit. Patient/Guardian expressed understanding and agreed to proceed.     Total encounter time 19 minutes which includes face-to-face time, chart reviewed, care coordination, order entry and documentation during this encounter.   Note: This document was prepared by [employer] voice dictation technology and any errors that results from this process are unintentional.    Leni ONEIDA Client, MD 04/28/2024

## 2024-06-16 ENCOUNTER — Other Ambulatory Visit (HOSPITAL_COMMUNITY): Payer: Self-pay | Admitting: Psychiatry

## 2024-06-16 DIAGNOSIS — F4310 Post-traumatic stress disorder, unspecified: Secondary | ICD-10-CM

## 2024-06-16 DIAGNOSIS — F319 Bipolar disorder, unspecified: Secondary | ICD-10-CM
